# Patient Record
Sex: Male | Born: 1955 | Race: White | Hispanic: No | Smoking: Former smoker
Health system: Southern US, Community
[De-identification: ages and names within clinical notes are randomized; demographics above are authoritative.]

## PROBLEM LIST (undated history)

## (undated) DIAGNOSIS — F99 Mental disorder, not otherwise specified: Secondary | ICD-10-CM

## (undated) DIAGNOSIS — F4310 Post-traumatic stress disorder, unspecified: Secondary | ICD-10-CM

## (undated) DIAGNOSIS — F319 Bipolar disorder, unspecified: Secondary | ICD-10-CM

## (undated) DIAGNOSIS — N200 Calculus of kidney: Secondary | ICD-10-CM

## (undated) DIAGNOSIS — F419 Anxiety disorder, unspecified: Secondary | ICD-10-CM

## (undated) DIAGNOSIS — W3400XA Accidental discharge from unspecified firearms or gun, initial encounter: Secondary | ICD-10-CM

## (undated) DIAGNOSIS — I10 Essential (primary) hypertension: Secondary | ICD-10-CM

## (undated) HISTORY — PX: TONSILLECTOMY: SUR1361

## (undated) HISTORY — PX: NOSE SURGERY: SHX723

## (undated) HISTORY — PX: OTHER SURGICAL HISTORY: SHX169

---

## 1998-09-22 ENCOUNTER — Emergency Department (HOSPITAL_COMMUNITY): Admission: EM | Admit: 1998-09-22 | Discharge: 1998-09-22 | Payer: Self-pay | Admitting: Emergency Medicine

## 1998-11-06 ENCOUNTER — Emergency Department (HOSPITAL_COMMUNITY): Admission: EM | Admit: 1998-11-06 | Discharge: 1998-11-06 | Payer: Self-pay | Admitting: Emergency Medicine

## 1998-11-06 ENCOUNTER — Encounter: Payer: Self-pay | Admitting: Emergency Medicine

## 1998-11-15 ENCOUNTER — Emergency Department (HOSPITAL_COMMUNITY): Admission: EM | Admit: 1998-11-15 | Discharge: 1998-11-16 | Payer: Self-pay | Admitting: Emergency Medicine

## 1998-11-16 ENCOUNTER — Inpatient Hospital Stay (HOSPITAL_COMMUNITY): Admission: EM | Admit: 1998-11-16 | Discharge: 1998-11-20 | Payer: Self-pay | Admitting: Emergency Medicine

## 2001-04-25 ENCOUNTER — Other Ambulatory Visit (HOSPITAL_COMMUNITY): Admission: RE | Admit: 2001-04-25 | Discharge: 2001-05-10 | Payer: Self-pay | Admitting: Psychiatry

## 2004-05-10 ENCOUNTER — Emergency Department (HOSPITAL_COMMUNITY): Admission: EM | Admit: 2004-05-10 | Discharge: 2004-05-10 | Payer: Self-pay | Admitting: Emergency Medicine

## 2004-07-19 ENCOUNTER — Encounter: Payer: Self-pay | Admitting: Emergency Medicine

## 2004-07-19 ENCOUNTER — Inpatient Hospital Stay (HOSPITAL_COMMUNITY): Admission: RE | Admit: 2004-07-19 | Discharge: 2004-07-24 | Payer: Self-pay | Admitting: Psychiatry

## 2004-11-20 ENCOUNTER — Emergency Department (HOSPITAL_COMMUNITY): Admission: EM | Admit: 2004-11-20 | Discharge: 2004-11-20 | Payer: Self-pay | Admitting: Emergency Medicine

## 2006-06-01 ENCOUNTER — Emergency Department (HOSPITAL_COMMUNITY): Admission: EM | Admit: 2006-06-01 | Discharge: 2006-06-01 | Payer: Self-pay | Admitting: Emergency Medicine

## 2008-04-16 ENCOUNTER — Inpatient Hospital Stay (HOSPITAL_COMMUNITY): Admission: AD | Admit: 2008-04-16 | Discharge: 2008-04-20 | Payer: Self-pay | Admitting: *Deleted

## 2008-04-16 ENCOUNTER — Encounter: Payer: Self-pay | Admitting: Emergency Medicine

## 2008-04-16 ENCOUNTER — Ambulatory Visit: Payer: Self-pay | Admitting: *Deleted

## 2009-11-11 ENCOUNTER — Emergency Department (HOSPITAL_COMMUNITY): Admission: EM | Admit: 2009-11-11 | Discharge: 2009-11-11 | Payer: Self-pay | Admitting: Emergency Medicine

## 2009-12-03 ENCOUNTER — Emergency Department (HOSPITAL_COMMUNITY): Admission: EM | Admit: 2009-12-03 | Discharge: 2009-12-03 | Payer: Self-pay | Admitting: Emergency Medicine

## 2009-12-10 ENCOUNTER — Emergency Department (HOSPITAL_COMMUNITY): Admission: EM | Admit: 2009-12-10 | Discharge: 2009-12-10 | Payer: Self-pay | Admitting: Emergency Medicine

## 2010-02-13 ENCOUNTER — Emergency Department (HOSPITAL_COMMUNITY): Admission: EM | Admit: 2010-02-13 | Discharge: 2010-02-13 | Payer: Self-pay | Admitting: Emergency Medicine

## 2010-02-16 ENCOUNTER — Emergency Department (HOSPITAL_COMMUNITY): Admission: EM | Admit: 2010-02-16 | Discharge: 2010-02-16 | Payer: Self-pay | Admitting: Emergency Medicine

## 2011-03-29 LAB — DIFFERENTIAL
Basophils Absolute: 0 10*3/uL (ref 0.0–0.1)
Basophils Relative: 0 % (ref 0–1)
Eosinophils Absolute: 0.3 10*3/uL (ref 0.0–0.7)
Eosinophils Relative: 3 % (ref 0–5)
Lymphocytes Relative: 33 % (ref 12–46)
Lymphs Abs: 3.4 10*3/uL (ref 0.7–4.0)
Monocytes Absolute: 0.7 10*3/uL (ref 0.1–1.0)
Monocytes Relative: 6 % (ref 3–12)
Neutro Abs: 6 10*3/uL (ref 1.7–7.7)
Neutrophils Relative %: 58 % (ref 43–77)

## 2011-03-29 LAB — CBC
HCT: 45.2 % (ref 39.0–52.0)
Hemoglobin: 16 g/dL (ref 13.0–17.0)
MCHC: 35.3 g/dL (ref 30.0–36.0)
MCV: 96.7 fL (ref 78.0–100.0)
Platelets: 266 10*3/uL (ref 150–400)
RBC: 4.68 MIL/uL (ref 4.22–5.81)
RDW: 14.3 % (ref 11.5–15.5)
WBC: 10.4 10*3/uL (ref 4.0–10.5)

## 2011-03-29 LAB — BASIC METABOLIC PANEL WITH GFR
BUN: 8 mg/dL (ref 6–23)
CO2: 27 meq/L (ref 19–32)
Calcium: 8.9 mg/dL (ref 8.4–10.5)
Chloride: 108 meq/L (ref 96–112)
Creatinine, Ser: 0.99 mg/dL (ref 0.4–1.5)
GFR calc non Af Amer: >60 mL/min
Glucose, Bld: 136 mg/dL — ABNORMAL HIGH (ref 70–99)
Potassium: 4 meq/L (ref 3.5–5.1)
Sodium: 142 meq/L (ref 135–145)
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
## 2011-05-12 NOTE — Discharge Summary (Signed)
NAMEMarland Delacruz  ISSAI, WERLING NO.:  [id number]   MEDICAL RECORD NO.:  [id number]                   PATIENT TYPE:  IPS   LOCATION:  0302                                 FACILITY:  [facility]   PHYSICIAN:  Jeanice Lim, M.D.              DATE OF BIRTH:  1956/10/24   DATE OF ADMISSION:  07/19/2004  DATE OF DISCHARGE:  07/24/2004                                 DISCHARGE SUMMARY   IDENTIFYING DATA:  This is a 55 year old Caucasian male, separated,  voluntarily admitted with a history of bipolar disorder, presenting to the  ER agitated, hostile.  Alcohol level 200.  Requesting help with mood.  Feeling medications are not working.  House going up for sale.  Wife has  left him.  She was paying mortgage, drinking two beers last night, as per  patient.  Daily alcohol use.  Followed by Dr. Evelene Croon outpatient for one month  now.  [facility] had [facility] 68 [facility]  Admitted for bipolar and PTSD in the past.   MEDICATIONS:  Geodon (required in the ER) 20 mg IM, Trileptal, Abilify,  Seroquel.   ALLERGIES:  WHOOPING COUGH SERUM (which caused childhood seizure as per  patient).   PHYSICAL EXAMINATION:  Physical and neurological exam within normal limits.   LABORATORY DATA:  Routine admission labs within normal limits.   MENTAL STATUS EXAM:  Fully alert, anxious, jittery, tapping feet, jiggling  legs all through interview.  Cooperative.  Speech within normal limits.  Mood irritable, dysphoric and guarded.  Thought process mostly goal directed  and vague.  Positive suicidal ideation.  Cognitively intact.  Judgment and  insight impaired.   MEDICATIONS:  Past medications include Depakote (which was too expensive)  and lithium (caused a side effect to his body that he did not like but would  not elaborate).   ADMISSION DIAGNOSES:   AXIS I:  1. Likely bipolar disorder, hypomanic state.  2. Post-traumatic stress disorder by  history.  3. Alcohol abuse versus dependence.   AXIS II:  Deferred.   AXIS III:  None.   AXIS IV:  Moderate (problems related to support system, financial stress and  separation).   AXIS V:  20/55-60.   HOSPITAL COURSE:  The patient was admitted and ordered routine p.r.n.  medications and underwent further monitoring.  Was encouraged to participate  in individual, group and milieu therapy.  Was placed on 15-minute safety  checks.  Restarted on Seroquel and Abilify continued.  Trileptal continued  and Valium discontinued and patient was placed on a Librium detox protocol  due to alcohol use and risk issues related.  The patient complained of mood  swings and described multiple stressors, feeling overwhelmed and had been  noncompliant with medications in part due to expense.  Was hypertalkative,  agitated, easy to anger.  Trileptal was changed to Tegretol to optimize  stabilization  of mood.  The patient gradually showed improvement, becoming  less labile, less reactive with improved frustration tolerance and  resolution of suicidal or homicidal ideation.  Mood became more stable and  affect brighter with less depressive symptoms and no mood elevation or  swings.  There is no dangerous ideation or psychotic symptoms.  The patient  reported motivation to be compliant with the aftercare plan.  The patient  was given medication education.   DISCHARGE MEDICATIONS:  1. Abilify 15 mg b.i.d.  2. Seroquel 200 mg q.h.s.  3. Quattro 200 mg, 2 q.a.m. and 2 q.h.s. versus Tegretol XR 200 mg, 2 q.a.m.     and 2 q.h.s.   FOLLOW UP:  The patient is to follow up with Dr. Evelene Croon on Thursday, July 28, 2004 at 11:45 a.m.  The patient also had followup with [name] on  July 29, 2004 at 11 a.m.   DISCHARGE DIAGNOSES:   AXIS I:  1. Likely bipolar disorder, hypomanic state.  2. Post-traumatic stress disorder by history.  3. Alcohol abuse versus dependence.   AXIS II:  Deferred.   AXIS III:   None.   AXIS IV:  Moderate (problems related to support system, financial stress and  separation).   AXIS V:  Global Assessment of Functioning on discharge 55.                                               Jeanice Lim, M.D.    JEM/MEDQ  D:  08/14/2004  T:  08/14/2004  Job:  [id number]
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
## 2011-07-04 ENCOUNTER — Emergency Department (HOSPITAL_COMMUNITY)
Admission: EM | Admit: 2011-07-04 | Discharge: 2011-07-05 | Discharge status: Against Medical Advice | Payer: Medicare Other | Source: Home / Self Care | Attending: Emergency Medicine | Admitting: Emergency Medicine

## 2011-07-04 DIAGNOSIS — Y9229 Other specified public building as the place of occurrence of the external cause: Secondary | ICD-10-CM | POA: Insufficient documentation

## 2011-07-04 DIAGNOSIS — T07XXXA Unspecified multiple injuries, initial encounter: Secondary | ICD-10-CM | POA: Insufficient documentation

## 2011-07-04 DIAGNOSIS — M25539 Pain in unspecified wrist: Secondary | ICD-10-CM | POA: Insufficient documentation

## 2011-07-04 DIAGNOSIS — H1130 Conjunctival hemorrhage, unspecified eye: Secondary | ICD-10-CM | POA: Insufficient documentation

## 2011-07-04 DIAGNOSIS — M542 Cervicalgia: Secondary | ICD-10-CM | POA: Insufficient documentation

## 2011-07-05 ENCOUNTER — Emergency Department (HOSPITAL_COMMUNITY): Payer: Medicare Other

## 2011-07-05 ENCOUNTER — Emergency Department (HOSPITAL_COMMUNITY)
Admission: EM | Admit: 2011-07-05 | Discharge: 2011-07-06 | Disposition: A | Discharge status: Other Acute Inpatient Hospital | Payer: Medicare Other | Source: Home / Self Care | Attending: Emergency Medicine | Admitting: Emergency Medicine

## 2011-07-05 DIAGNOSIS — R443 Hallucinations, unspecified: Secondary | ICD-10-CM | POA: Insufficient documentation

## 2011-07-05 DIAGNOSIS — F17200 Nicotine dependence, unspecified, uncomplicated: Secondary | ICD-10-CM | POA: Insufficient documentation

## 2011-07-05 DIAGNOSIS — F4310 Post-traumatic stress disorder, unspecified: Secondary | ICD-10-CM | POA: Insufficient documentation

## 2011-07-05 DIAGNOSIS — Z79899 Other long term (current) drug therapy: Secondary | ICD-10-CM | POA: Insufficient documentation

## 2011-07-05 DIAGNOSIS — S0010XA Contusion of unspecified eyelid and periocular area, initial encounter: Secondary | ICD-10-CM | POA: Insufficient documentation

## 2011-07-05 DIAGNOSIS — X58XXXA Exposure to other specified factors, initial encounter: Secondary | ICD-10-CM | POA: Insufficient documentation

## 2011-07-05 DIAGNOSIS — F319 Bipolar disorder, unspecified: Secondary | ICD-10-CM | POA: Insufficient documentation

## 2011-07-05 DIAGNOSIS — R45851 Suicidal ideations: Secondary | ICD-10-CM | POA: Insufficient documentation

## 2011-07-05 LAB — COMPREHENSIVE METABOLIC PANEL
Albumin: 4.7 g/dL (ref 3.5–5.2)
Alkaline Phosphatase: 112 U/L (ref 39–117)
BUN: 25 mg/dL — ABNORMAL HIGH (ref 6–23)
Calcium: 9.5 mg/dL (ref 8.4–10.5)
Chloride: 100 mEq/L (ref 96–112)
GFR calc Af Amer: >60 mL/min (ref >60)
Glucose, Bld: 97 mg/dL (ref 70–99)
Potassium: 3.6 mEq/L (ref 3.5–5.1)
Sodium: 139 mEq/L (ref 135–145)
Total Protein: 7.9 g/dL (ref 6.0–8.3)

## 2011-07-05 LAB — DIFFERENTIAL
Lymphocytes Relative: 18 % (ref 12–46)
Monocytes Absolute: 1.7 10*3/uL — ABNORMAL HIGH (ref 0.1–1.0)
Neutro Abs: 11.6 10*3/uL — ABNORMAL HIGH (ref 1.7–7.7)

## 2011-07-05 LAB — CBC
Hemoglobin: 16.2 g/dL (ref 13.0–17.0)
MCH: 32.7 pg (ref 26.0–34.0)
MCHC: 34.1 g/dL (ref 30.0–36.0)
Platelets: 310 10*3/uL (ref 150–400)
RDW: 12.8 % (ref 11.5–15.5)
WBC: 16.2 10*3/uL — ABNORMAL HIGH (ref 4.0–10.5)

## 2011-07-06 ENCOUNTER — Inpatient Hospital Stay (HOSPITAL_COMMUNITY)
Admission: AD | Admit: 2011-07-06 | Discharge: 2011-07-14 | DRG: 885 | Payer: Medicare Other | Attending: Psychiatry | Admitting: Psychiatry

## 2011-07-06 DIAGNOSIS — S0010XA Contusion of unspecified eyelid and periocular area, initial encounter: Secondary | ICD-10-CM

## 2011-07-06 DIAGNOSIS — F39 Unspecified mood [affective] disorder: Principal | ICD-10-CM

## 2011-07-06 DIAGNOSIS — D72829 Elevated white blood cell count, unspecified: Secondary | ICD-10-CM

## 2011-07-06 DIAGNOSIS — I10 Essential (primary) hypertension: Secondary | ICD-10-CM

## 2011-07-06 DIAGNOSIS — F4310 Post-traumatic stress disorder, unspecified: Secondary | ICD-10-CM

## 2011-07-06 DIAGNOSIS — R45851 Suicidal ideations: Secondary | ICD-10-CM

## 2011-07-06 DIAGNOSIS — R45850 Homicidal ideations: Secondary | ICD-10-CM

## 2011-07-06 DIAGNOSIS — F312 Bipolar disorder, current episode manic severe with psychotic features: Secondary | ICD-10-CM

## 2011-07-06 DIAGNOSIS — X58XXXA Exposure to other specified factors, initial encounter: Secondary | ICD-10-CM

## 2011-07-06 DIAGNOSIS — F1010 Alcohol abuse, uncomplicated: Secondary | ICD-10-CM

## 2011-07-06 DIAGNOSIS — F411 Generalized anxiety disorder: Secondary | ICD-10-CM

## 2011-07-06 DIAGNOSIS — Z6379 Other stressful life events affecting family and household: Secondary | ICD-10-CM

## 2011-07-06 LAB — RAPID URINE DRUG SCREEN, HOSP PERFORMED
Amphetamines: NOT DETECTED
Barbiturates: NOT DETECTED
Benzodiazepines: NOT DETECTED
Tetrahydrocannabinol: POSITIVE — AB

## 2011-07-07 DIAGNOSIS — F319 Bipolar disorder, unspecified: Secondary | ICD-10-CM

## 2011-07-07 DIAGNOSIS — F4310 Post-traumatic stress disorder, unspecified: Secondary | ICD-10-CM
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
## 2011-09-19 LAB — POCT I-STAT, CHEM 8
BUN: 11
Chloride: 103
Glucose, Bld: 92
Hemoglobin: 17
Potassium: 3.7
Sodium: 139

## 2011-09-19 LAB — RAPID URINE DRUG SCREEN, HOSP PERFORMED
Amphetamines: NOT DETECTED
Barbiturates: NOT DETECTED
Benzodiazepines: POSITIVE — AB
Cocaine: NOT DETECTED
Opiates: NOT DETECTED
Tetrahydrocannabinol: NOT DETECTED

## 2011-09-19 LAB — ETHANOL: Alcohol, Ethyl (B): 283 — ABNORMAL HIGH

## 2013-07-08 ENCOUNTER — Emergency Department (HOSPITAL_COMMUNITY)
Admission: EM | Admit: 2013-07-08 | Discharge: 2013-07-09 | Disposition: A | Discharge status: Home / Self Care | Payer: Medicare Other | Attending: Emergency Medicine | Admitting: Emergency Medicine

## 2013-07-08 DIAGNOSIS — Z008 Encounter for other general examination: Secondary | ICD-10-CM | POA: Insufficient documentation

## 2013-07-08 DIAGNOSIS — R45850 Homicidal ideations: Secondary | ICD-10-CM | POA: Insufficient documentation

## 2013-07-08 DIAGNOSIS — F319 Bipolar disorder, unspecified: Secondary | ICD-10-CM | POA: Insufficient documentation

## 2013-07-08 DIAGNOSIS — IMO0002 Reserved for concepts with insufficient information to code with codable children: Secondary | ICD-10-CM | POA: Insufficient documentation

## 2013-07-08 HISTORY — DX: Mental disorder, not otherwise specified: F99

## 2013-07-08 LAB — COMPREHENSIVE METABOLIC PANEL
ALT: 29 U/L (ref 0–53)
AST: 31 U/L (ref 0–37)
Albumin: 4 g/dL (ref 3.5–5.2)
CO2: 24 mEq/L (ref 19–32)
Calcium: 9.3 mg/dL (ref 8.4–10.5)
Chloride: 97 mEq/L (ref 96–112)
Creatinine, Ser: 0.97 mg/dL (ref 0.50–1.35)
GFR calc non Af Amer: >90 mL/min (ref >90)
Sodium: 137 mEq/L (ref 135–145)

## 2013-07-08 LAB — CBC WITH DIFFERENTIAL/PLATELET
Basophils Absolute: 0 10*3/uL (ref 0.0–0.1)
Basophils Relative: 0 % (ref 0–1)
Eosinophils Relative: 1 % (ref 0–5)
Lymphocytes Relative: 35 % (ref 12–46)
MCHC: 33.9 g/dL (ref 30.0–36.0)
Neutro Abs: 7.2 10*3/uL (ref 1.7–7.7)
Platelets: 347 10*3/uL (ref 150–400)
RDW: 14 % (ref 11.5–15.5)
WBC: 13.1 10*3/uL — ABNORMAL HIGH (ref 4.0–10.5)

## 2013-07-08 LAB — URINALYSIS, ROUTINE W REFLEX MICROSCOPIC
Bilirubin Urine: NEGATIVE
Hgb urine dipstick: NEGATIVE
Ketones, ur: NEGATIVE mg/dL
Nitrite: NEGATIVE
Protein, ur: NEGATIVE mg/dL
Specific Gravity, Urine: 1.015 (ref 1.005–1.030)
Urobilinogen, UA: 0.2 mg/dL (ref 0.0–1.0)

## 2013-07-08 LAB — RAPID URINE DRUG SCREEN, HOSP PERFORMED
Amphetamines: NOT DETECTED
Barbiturates: NOT DETECTED
Benzodiazepines: NOT DETECTED
Cocaine: NOT DETECTED
Tetrahydrocannabinol: POSITIVE — AB

## 2013-07-08 LAB — ETHANOL: Alcohol, Ethyl (B): 93 mg/dL — ABNORMAL HIGH (ref 0–11)

## 2013-07-08 MED ORDER — ACETAMINOPHEN 325 MG PO TABS: 650.0000 mg | ORAL_TABLET | ORAL | Status: DC | PRN

## 2013-07-08 MED ORDER — ZOLPIDEM TARTRATE 5 MG PO TABS: 5.0000 mg | ORAL_TABLET | Freq: Every evening | ORAL | Status: DC | PRN

## 2013-07-08 MED ORDER — LORAZEPAM 1 MG PO TABS
1.0000 mg | ORAL_TABLET | Freq: Three times a day (TID) | ORAL | Status: DC | PRN
  Administered 2013-07-08: 1 mg via ORAL
  Filled 2013-07-08: qty 1

## 2013-07-08 MED ORDER — IBUPROFEN 200 MG PO TABS: 600.0000 mg | ORAL_TABLET | Freq: Three times a day (TID) | ORAL | Status: DC | PRN

## 2013-07-08 MED ORDER — NICOTINE 21 MG/24HR TD PT24
21.0000 mg | MEDICATED_PATCH | Freq: Every day | TRANSDERMAL | Status: DC
  Administered 2013-07-08: 21 mg via TRANSDERMAL
  Filled 2013-07-08: qty 1

## 2013-07-08 MED ORDER — ONDANSETRON HCL 4 MG PO TABS: 4.0000 mg | ORAL_TABLET | Freq: Three times a day (TID) | ORAL | Status: DC | PRN

## 2013-07-08 MED ORDER — LORAZEPAM 2 MG/ML IJ SOLN
1.0000 mg | Freq: Once | INTRAMUSCULAR | Status: AC
  Administered 2013-07-08: 1 mg via INTRAMUSCULAR
  Filled 2013-07-08: qty 1

## 2013-07-08 MED ORDER — ALUM & MAG HYDROXIDE-SIMETH 200-200-20 MG/5ML PO SUSP: 30.0000 mL | ORAL | Status: DC | PRN

## 2013-07-08 NOTE — ED Notes (Signed)
Patient is awake and alert, denies HI or SI, pt is pleasant and cooperative. Pt is medication compliant. Pt c/o of bilateral foot pain, 9/10 on (10/10) shooting pain radiating to mid ankle.  Interacts well with staff and others. Pt reports not taking is medications for two years; Wellbutrin, Zyprexa, Valium and Trazodone pt doesn't recall the dose/mg. Encouraged and support offered. Will continue to monitor for safety. Q15 mins/ hourly rounding.

## 2013-07-08 NOTE — ED Notes (Signed)
Patient has one bag of belongings in locker 26.  Security has patient's knife.
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
## 2013-07-08 NOTE — ED Provider Notes (Signed)
History    CSN: [id number] Arrival date & time 07/08/13  1349  First MD Initiated Contact with Patient 07/08/13 1351     Chief Complaint  Patient presents with  . Medical Clearance   (Consider location/radiation/quality/duration/timing/severity/associated sxs/prior Treatment) HPI Comments: Patient is a 57 year old male with a stated history of PTSD, depression, bipolar disorder, and OCD (no official documentation found and patient not a reliable historian) who presents for homicidal ideation with onset earlier today. Patient states that he has been off of his psychiatric medications for 2 years; states he is supposed to be taking Zyprexa, Valium, Wellbutrin, and a fourth medication the patient cannot recall the name of. Patient states he has been followed by Dr. Evelene Croon, his psychiatrist, off [address] but has not seen her in over 2 years. Claiming to have follow up appt scheduled for 08/29/13. Patient denies homicidal plan, only that he is having thoughts of killing "all of mankind". States he drank 3 beers today and denies illicit drug use. Patient without any medical or pain complaints. Denies SI and plan for suicide. Patient is not here voluntarily; will likely require IVC.  The history is provided by the patient. No language interpreter was used.   No past medical history on file. No past surgical history on file. No family history on file. History  Substance Use Topics  . Smoking status: Not on file  . Smokeless tobacco: Not on file  . Alcohol Use: Not on file    Review of Systems  Constitutional: Negative for fever.  Psychiatric/Behavioral: Positive for agitation. Negative for suicidal ideas.       +homicidal ideation  All other systems reviewed and are negative.    Allergies  Review of patient's allergies indicates no known allergies.  Home Medications  No current outpatient prescriptions on file. BP 148/90  Pulse 88  Temp(Src) 97.6 F (36.4 C) (Oral)  Resp 16   SpO2 95%  Physical Exam  Nursing note and vitals reviewed. Constitutional: He is oriented to person, place, and time. He appears well-developed and well-nourished. No distress.  HENT:  Head: Normocephalic and atraumatic.  Mouth/Throat: Oropharynx is clear and moist. No oropharyngeal exudate.  Eyes: Conjunctivae and EOM are normal. Pupils are equal, round, and reactive to light. No scleral icterus.  Neck: Normal range of motion. Neck supple.  Cardiovascular: Regular rhythm and intact distal pulses.   Tachycardic rate, regular rhythm. Heart sounds normal.  Pulmonary/Chest: Effort normal and breath sounds normal. No respiratory distress. He has no wheezes. He has no rales.  Abdominal: Soft. He exhibits no distension. There is no tenderness. There is no rebound and no guarding.  Musculoskeletal: Normal range of motion.  Lymphadenopathy:    He has no cervical adenopathy.  Neurological: He is alert and oriented to person, place, and time. He has normal reflexes.  Skin: Skin is warm and dry. No rash noted. He is not diaphoretic. No erythema. No pallor.  Psychiatric: He has a normal mood and affect. His behavior is normal.    ED Course  Procedures (including critical care time) Labs Reviewed  CBC WITH DIFFERENTIAL - Abnormal; Notable for the following:    WBC 13.1 (*)    Lymphs Abs 4.5 (*)    Monocytes Absolute 1.2 (*)    All other components within normal limits  COMPREHENSIVE METABOLIC PANEL - Abnormal; Notable for the following:    Glucose, Bld 118 (*)    Alkaline Phosphatase 146 (*)    All other components within  normal limits  ETHANOL - Abnormal; Notable for the following:    Alcohol, Ethyl (B) 93 (*)    All other components within normal limits  URINE RAPID DRUG SCREEN (HOSP PERFORMED)  URINALYSIS, ROUTINE W REFLEX MICROSCOPIC   No results found.  1. Homicidal ideation 2. Agitation  MDM  Patient here for homicidal ideation without plan. Patient not a reliable historian,  but claiming to have hx of PTSD, depression, bipolar d/o, and OCD; off meds for 2 years. Medical clearance work up ordered as well as IM ativan for agitation. Physical exam benign. Spoke with [place] from the ACT team who will have ACT evaluate the patient. Patient will also be requiring telepsych consult for HI. Patient will need IVC; Dr. Loretha Stapler to complete.  Patient signed out to Dr. Jodi Mourning who is the psych doctor for the oncoming shift. Patient medically cleared. Temp psych orders placed.      Antony Madura, PA-C 07/08/13 1621

## 2013-07-09 ENCOUNTER — Encounter (HOSPITAL_COMMUNITY): Payer: Self-pay | Admitting: *Deleted

## 2013-07-09 MED ORDER — CLONAZEPAM 1 MG PO TABS: 1.0000 mg | ORAL_TABLET | Freq: Every day | ORAL | Status: DC

## 2013-07-09 MED ORDER — BUPROPION HCL ER (SR) 150 MG PO TB12: 150.0000 mg | ORAL_TABLET | Freq: Every day | ORAL | Status: DC

## 2013-07-09 MED ORDER — OLANZAPINE 20 MG PO TABS: 20.0000 mg | ORAL_TABLET | Freq: Every day | ORAL | Status: DC

## 2013-07-09 MED ORDER — TRAZODONE HCL 100 MG PO TABS: 100.0000 mg | ORAL_TABLET | Freq: Every day | ORAL | Status: DC

## 2013-07-09 NOTE — ED Provider Notes (Signed)
12:34 AM Patient seen and evaluated by the psychiatrist Dr. Jacky Kindle, who recommends discharge home.  He does not believe the patient be a threat to himself or others.  He recommended Zyprexa, Wellbutrin, trazodone, clonazepam.  Please see consultation note for complete details.  Lyanne Co, MD 07/09/13 [phone]
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
## 2013-07-09 NOTE — ED Provider Notes (Signed)
Medical screening examination/treatment/procedure(s) were conducted as a shared visit with non-physician practitioner(s) and myself.  I personally evaluated the patient during the encounter.   Please see my separate note.    Candyce Churn, MD 07/09/13 [phone]

## 2013-07-31 ENCOUNTER — Emergency Department (HOSPITAL_COMMUNITY)
Admission: EM | Admit: 2013-07-31 | Discharge: 2013-07-31 | Discharge status: Against Medical Advice | Payer: Medicare Other | Attending: Emergency Medicine | Admitting: Emergency Medicine

## 2013-07-31 ENCOUNTER — Encounter (HOSPITAL_COMMUNITY): Payer: Self-pay | Admitting: Emergency Medicine

## 2013-07-31 DIAGNOSIS — Z79899 Other long term (current) drug therapy: Secondary | ICD-10-CM | POA: Insufficient documentation

## 2013-07-31 DIAGNOSIS — F319 Bipolar disorder, unspecified: Secondary | ICD-10-CM | POA: Insufficient documentation

## 2013-07-31 DIAGNOSIS — M79609 Pain in unspecified limb: Secondary | ICD-10-CM | POA: Insufficient documentation

## 2013-07-31 DIAGNOSIS — Z87442 Personal history of urinary calculi: Secondary | ICD-10-CM | POA: Insufficient documentation

## 2013-07-31 DIAGNOSIS — F411 Generalized anxiety disorder: Secondary | ICD-10-CM | POA: Insufficient documentation

## 2013-07-31 DIAGNOSIS — I10 Essential (primary) hypertension: Secondary | ICD-10-CM | POA: Insufficient documentation

## 2013-07-31 DIAGNOSIS — G8929 Other chronic pain: Secondary | ICD-10-CM | POA: Insufficient documentation

## 2013-07-31 DIAGNOSIS — Z8659 Personal history of other mental and behavioral disorders: Secondary | ICD-10-CM | POA: Insufficient documentation

## 2013-07-31 DIAGNOSIS — M549 Dorsalgia, unspecified: Secondary | ICD-10-CM | POA: Insufficient documentation

## 2013-07-31 DIAGNOSIS — F17200 Nicotine dependence, unspecified, uncomplicated: Secondary | ICD-10-CM | POA: Insufficient documentation

## 2013-07-31 HISTORY — DX: Calculus of kidney: N20.0

## 2013-07-31 HISTORY — DX: Accidental discharge from unspecified firearms or gun, initial encounter: W34.00XA

## 2013-07-31 HISTORY — DX: Essential (primary) hypertension: I10

## 2013-07-31 HISTORY — DX: Post-traumatic stress disorder, unspecified: F43.10

## 2013-07-31 HISTORY — DX: Anxiety disorder, unspecified: F41.9

## 2013-07-31 HISTORY — DX: Bipolar disorder, unspecified: F31.9

## 2013-07-31 LAB — RAPID URINE DRUG SCREEN, HOSP PERFORMED
Amphetamines: NOT DETECTED
Cocaine: NOT DETECTED
Opiates: NOT DETECTED

## 2013-07-31 LAB — COMPREHENSIVE METABOLIC PANEL
Albumin: 3.6 g/dL (ref 3.5–5.2)
BUN: 12 mg/dL (ref 6–23)
Calcium: 9 mg/dL (ref 8.4–10.5)
Chloride: 98 mEq/L (ref 96–112)
Creatinine, Ser: 0.95 mg/dL (ref 0.50–1.35)
Total Bilirubin: 0.2 mg/dL — ABNORMAL LOW (ref 0.3–1.2)

## 2013-07-31 LAB — ETHANOL: Alcohol, Ethyl (B): 36 mg/dL — ABNORMAL HIGH (ref 0–11)

## 2013-07-31 LAB — CBC
HCT: 44.9 % (ref 39.0–52.0)
Hemoglobin: 15.3 g/dL (ref 13.0–17.0)
MCH: 33 pg (ref 26.0–34.0)
MCHC: 34.1 g/dL (ref 30.0–36.0)
MCV: 97 fL (ref 78.0–100.0)
RBC: 4.63 MIL/uL (ref 4.22–5.81)
RDW: 13.8 % (ref 11.5–15.5)

## 2013-07-31 MED ORDER — ZIPRASIDONE MESYLATE 20 MG IM SOLR
INTRAMUSCULAR | Status: AC
  Filled 2013-07-31: qty 20

## 2013-07-31 MED ORDER — ZIPRASIDONE MESYLATE 20 MG IM SOLR: 20.0000 mg | INTRAMUSCULAR | Status: DC

## 2013-07-31 NOTE — ED Notes (Signed)
Pt states he is here for mania, PTSD, having flash backs, states he is hungry, has no money, is having pain in his back and legs.  Pt states he has been here for same in the past   Pt is agitated but cooperative with triage personal but is belligerent and agitated with security and police

## 2013-07-31 NOTE — ED Notes (Signed)
Pt states he is tired of waiting to be assessed and wants to leave. EDP notified and agrees that pt may leave AMA.

## 2013-07-31 NOTE — ED Notes (Addendum)
Pt belongings as follows pair of green shorts, black boxers, brown shoes, 2 trojan bare skin condoms, 3 cigarettes, samsung galaxy s phone.  Pt placed in blue scrubs and wanded.  Pt belongings placed in locker 30.

## 2013-07-31 NOTE — ED Notes (Signed)
Pt states he has been out of his meds for a few weeks and has an appt with his therapist is Sept 5th
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
## 2013-11-06 ENCOUNTER — Emergency Department (HOSPITAL_COMMUNITY)
Admission: EM | Admit: 2013-11-06 | Discharge: 2013-11-11 | Disposition: A | Discharge status: Home / Self Care | Payer: Medicare Other | Attending: Emergency Medicine | Admitting: Emergency Medicine

## 2013-11-06 ENCOUNTER — Encounter (HOSPITAL_COMMUNITY): Payer: Self-pay | Admitting: Emergency Medicine

## 2013-11-06 DIAGNOSIS — Z79899 Other long term (current) drug therapy: Secondary | ICD-10-CM | POA: Insufficient documentation

## 2013-11-06 DIAGNOSIS — F319 Bipolar disorder, unspecified: Secondary | ICD-10-CM | POA: Insufficient documentation

## 2013-11-06 DIAGNOSIS — R45850 Homicidal ideations: Secondary | ICD-10-CM

## 2013-11-06 DIAGNOSIS — F17200 Nicotine dependence, unspecified, uncomplicated: Secondary | ICD-10-CM | POA: Insufficient documentation

## 2013-11-06 DIAGNOSIS — F411 Generalized anxiety disorder: Secondary | ICD-10-CM | POA: Insufficient documentation

## 2013-11-06 DIAGNOSIS — F332 Major depressive disorder, recurrent severe without psychotic features: Secondary | ICD-10-CM | POA: Diagnosis present

## 2013-11-06 DIAGNOSIS — IMO0002 Reserved for concepts with insufficient information to code with codable children: Secondary | ICD-10-CM | POA: Insufficient documentation

## 2013-11-06 DIAGNOSIS — R45851 Suicidal ideations: Secondary | ICD-10-CM | POA: Insufficient documentation

## 2013-11-06 DIAGNOSIS — F200 Paranoid schizophrenia: Secondary | ICD-10-CM | POA: Insufficient documentation

## 2013-11-06 DIAGNOSIS — R4789 Other speech disturbances: Secondary | ICD-10-CM | POA: Insufficient documentation

## 2013-11-06 DIAGNOSIS — I10 Essential (primary) hypertension: Secondary | ICD-10-CM | POA: Insufficient documentation

## 2013-11-06 DIAGNOSIS — Z87442 Personal history of urinary calculi: Secondary | ICD-10-CM | POA: Insufficient documentation

## 2013-11-06 DIAGNOSIS — Z87828 Personal history of other (healed) physical injury and trauma: Secondary | ICD-10-CM | POA: Insufficient documentation

## 2013-11-06 LAB — COMPREHENSIVE METABOLIC PANEL
CO2: 26 mEq/L (ref 19–32)
Calcium: 9.2 mg/dL (ref 8.4–10.5)
Creatinine, Ser: 1.44 mg/dL — ABNORMAL HIGH (ref 0.50–1.35)
GFR calc Af Amer: 62 mL/min — ABNORMAL LOW (ref >90)
GFR calc non Af Amer: 53 mL/min — ABNORMAL LOW (ref >90)
Glucose, Bld: 102 mg/dL — ABNORMAL HIGH (ref 70–99)

## 2013-11-06 LAB — RAPID URINE DRUG SCREEN, HOSP PERFORMED
Cocaine: NOT DETECTED
Opiates: NOT DETECTED

## 2013-11-06 LAB — CBC
Hemoglobin: 15 g/dL (ref 13.0–17.0)
MCH: 32.1 pg (ref 26.0–34.0)
MCV: 94.4 fL (ref 78.0–100.0)
Platelets: 383 10*3/uL (ref 150–400)
RBC: 4.68 MIL/uL (ref 4.22–5.81)

## 2013-11-06 MED ORDER — ACETAMINOPHEN 325 MG PO TABS: 650.0000 mg | ORAL_TABLET | ORAL | Status: DC | PRN

## 2013-11-06 MED ORDER — OLANZAPINE 10 MG PO TABS
20.0000 mg | ORAL_TABLET | Freq: Every day | ORAL | Status: DC
  Administered 2013-11-06 – 2013-11-10 (×5): 20 mg via ORAL
  Filled 2013-11-06 (×5): qty 2

## 2013-11-06 MED ORDER — ALUM & MAG HYDROXIDE-SIMETH 200-200-20 MG/5ML PO SUSP: 30.0000 mL | ORAL | Status: DC | PRN

## 2013-11-06 MED ORDER — NICOTINE 21 MG/24HR TD PT24
21.0000 mg | MEDICATED_PATCH | Freq: Every day | TRANSDERMAL | Status: DC
  Administered 2013-11-06 – 2013-11-11 (×6): 21 mg via TRANSDERMAL
  Filled 2013-11-06 (×6): qty 1

## 2013-11-06 MED ORDER — SERTRALINE HCL 50 MG PO TABS
50.0000 mg | ORAL_TABLET | Freq: Every day | ORAL | Status: DC
  Administered 2013-11-06 – 2013-11-11 (×6): 50 mg via ORAL
  Filled 2013-11-06 (×5): qty 1

## 2013-11-06 MED ORDER — TRAZODONE HCL 100 MG PO TABS
100.0000 mg | ORAL_TABLET | Freq: Every day | ORAL | Status: DC
  Administered 2013-11-06 – 2013-11-10 (×5): 100 mg via ORAL
  Filled 2013-11-06 (×5): qty 1

## 2013-11-06 MED ORDER — IBUPROFEN 200 MG PO TABS
600.0000 mg | ORAL_TABLET | Freq: Three times a day (TID) | ORAL | Status: DC | PRN
  Administered 2013-11-07 – 2013-11-09 (×2): 600 mg via ORAL
  Filled 2013-11-06 (×2): qty 3

## 2013-11-06 MED ORDER — ONDANSETRON HCL 4 MG PO TABS: 4.0000 mg | ORAL_TABLET | Freq: Three times a day (TID) | ORAL | Status: DC | PRN

## 2013-11-06 MED ORDER — ZOLPIDEM TARTRATE 5 MG PO TABS: 5.0000 mg | ORAL_TABLET | Freq: Every evening | ORAL | Status: DC | PRN

## 2013-11-06 MED ORDER — ZIPRASIDONE MESYLATE 20 MG IM SOLR
20.0000 mg | Freq: Two times a day (BID) | INTRAMUSCULAR | Status: DC | PRN
Start: 2013-11-06 — End: 2013-11-11

## 2013-11-06 MED ORDER — CEPHALEXIN 250 MG PO CAPS
250.0000 mg | ORAL_CAPSULE | Freq: Four times a day (QID) | ORAL | Status: DC
  Administered 2013-11-06 – 2013-11-11 (×20): 250 mg via ORAL
  Filled 2013-11-06 (×24): qty 1

## 2013-11-06 MED ORDER — LORAZEPAM 1 MG PO TABS
2.0000 mg | ORAL_TABLET | Freq: Once | ORAL | Status: AC
  Administered 2013-11-06: 2 mg via ORAL
  Filled 2013-11-06: qty 2

## 2013-11-06 MED ORDER — LORAZEPAM 1 MG PO TABS
2.0000 mg | ORAL_TABLET | Freq: Once | ORAL | Status: AC | PRN
  Administered 2013-11-06: 2 mg via ORAL
  Filled 2013-11-06: qty 2

## 2013-11-06 NOTE — BH Assessment (Signed)
MHT faxed referral to [facility]. Spoke with [facility]; No beds. Also, spoke with Arline Asp at [facility] and she asked for a call back in a few minutes when she updates the bed list.  MHT will follow up.  -Dossie Arbour, MA  Mental Health Tech

## 2013-11-06 NOTE — BH Assessment (Signed)
Spoke with [facility]; At capacity - No beds available.  -Dossie Arbour, MA  Mental Health Tech

## 2013-11-06 NOTE — BH Assessment (Signed)
[facility] Assessment Progress Note  Per Thurman Coyer, RN, [facility], pt has been declined for admission to [facility] by Carolanne Grumbling, MD due to acuity.  Pt has reportedly been referred to [facility].  At 15:45 I spoke to [facility], MHT to inform him that further help with disposition will be appreciated.  He a agrees to pass this on to oncoming 2nd shift staff.  Doylene Canning, MA Triage Specialist 11/06/2013 @ 16:12

## 2013-11-06 NOTE — ED Notes (Addendum)
Pt has in belonging bag:  Light brown boots, cameoflage winter jacket, green t-shirt, blue jean shorts, brown belt, silver plated cross from the [employer] with a black cloth chain.

## 2013-11-06 NOTE — ED Notes (Signed)
Pt resting in bed. Pleasant calm and cooperative. Pt took antibiotics from this nurse without verbal or physical threats to this nurse. Pt came in to the ED with dx cellulitis on  inner calf. Pt has dry scabbed patch to inner calf. No drainage noted.

## 2013-11-06 NOTE — ED Notes (Signed)
Pt talking on phone leaving message for brother stating he received a bus pass from another facility and is now here in [place] and doesn't know if he can make it to [place] on the bus. Pt  Then talking to another person on phone.

## 2013-11-06 NOTE — BH Assessment (Signed)
Per Misty Stanley at [facility], she did not have Pt's referral fax. MHT resent the referral.  -Dossie Arbour, MA  Mental Health Tech

## 2013-11-06 NOTE — ED Notes (Signed)
Pt sitting in bed calm and cooperative. Pt eating his meal.
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
## 2013-11-06 NOTE — ED Notes (Signed)
Pt up to nurses station asking for staff to get his brothers phone number out of his wallet. He said he has to let his probation officer know he wouldn't be able to meet with him. He said his brother has the number.

## 2013-11-06 NOTE — ED Notes (Signed)
Pt states he would take medication PO and would also like some geodon and ativan in a shot and would like dilaudid also.
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
## 2013-11-06 NOTE — ED Notes (Signed)
Pt called EMS because he was feeling suicidal with no plan and would like to be admitted to [facility]

## 2013-11-06 NOTE — ED Notes (Signed)
Pt sitting on bed. Interacting with Mental health tech appropriately. Pt allowing staff to take vital signs.

## 2013-11-06 NOTE — ED Provider Notes (Signed)
Date: 11/06/2013  Rate: 89  Rhythm: normal sinus rhythm  QRS Axis: normal  Intervals: normal  ST/T Wave abnormalities: normal  Conduction Disutrbances: LPFB  Narrative Interpretation:   Old EKG Reviewed: none available    Dagmar Hait, MD 11/06/13 2049

## 2013-11-06 NOTE — ED Notes (Signed)
Pt taking a shower. No signs of aggression. No threats or verbal escalation from pt to this nurse or other staff. Pt calm and cooperative. No acts of violence to self.
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
## 2013-11-07 DIAGNOSIS — F332 Major depressive disorder, recurrent severe without psychotic features: Secondary | ICD-10-CM

## 2013-11-07 MED ORDER — HYDROXYZINE HCL 25 MG PO TABS
50.0000 mg | ORAL_TABLET | Freq: Four times a day (QID) | ORAL | Status: DC | PRN
  Administered 2013-11-07 – 2013-11-09 (×4): 50 mg via ORAL
  Filled 2013-11-07 (×4): qty 2
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
## 2013-11-07 NOTE — Progress Notes (Signed)
Per discussion with TTS, they will assist with pt disposition.   Catha Gosselin, [place] [phone]  ED CSW 11/07/2013 942am
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
## 2013-11-07 NOTE — Progress Notes (Signed)
Contacted the following facilities to seek bed availability:  [facility]- Per Peggy, Pt referral still needs to be reviewed by Dr.  [facility]- Per Aggie Cosier, one geriatric bed available with several discharges tomorrow, will send referral [facility]- Per Barnie Del, can fax referral to be reviewed  [facility]- Per Johnny Bridge, no beds available [facility]- Pt can not be [facility]- Per Selena Batten, at [facility]- No answer  Rodman Pickle, MHT

## 2013-11-08 LAB — CBC WITH DIFFERENTIAL/PLATELET
Basophils Relative: 1 % (ref 0–1)
Eosinophils Absolute: 0.8 10*3/uL — ABNORMAL HIGH (ref 0.0–0.7)
Eosinophils Relative: 7 % — ABNORMAL HIGH (ref 0–5)
HCT: 43.9 % (ref 39.0–52.0)
Hemoglobin: 14.9 g/dL (ref 13.0–17.0)
Lymphocytes Relative: 30 % (ref 12–46)
Lymphs Abs: 3.6 10*3/uL (ref 0.7–4.0)
MCH: 31.9 pg (ref 26.0–34.0)
MCHC: 33.9 g/dL (ref 30.0–36.0)
MCV: 94 fL (ref 78.0–100.0)
Monocytes Absolute: 0.9 10*3/uL (ref 0.1–1.0)
Monocytes Relative: 7 % (ref 3–12)
Platelets: 279 10*3/uL (ref 150–400)
RBC: 4.67 MIL/uL (ref 4.22–5.81)
WBC: 12 10*3/uL — ABNORMAL HIGH (ref 4.0–10.5)

## 2013-11-08 NOTE — ED Notes (Signed)
Lab in to draw

## 2013-11-08 NOTE — BH Assessment (Addendum)
[facility] Assessment Progress Note Referral faxed to [facility], as [facility] has beds per Danny (per Bobby Lindsay, LCSW).       

## 2013-11-08 NOTE — ED Provider Notes (Signed)
Patient had cellulitis on abx. CBC improving. Dispo as per psych.   Richardean Canal, MD 11/08/13 [phone]

## 2013-11-08 NOTE — ED Notes (Signed)
Nad, up to desk

## 2013-11-08 NOTE — ED Notes (Signed)
healing lac lt inner calf noted-from where his boots rubbed per pt.  No drainage/redness noted.

## 2013-11-08 NOTE — ED Notes (Signed)
Talking w/ other patient in the hall 

## 2013-11-08 NOTE — ED Notes (Signed)
Up in the bathroom to shower and change scrubs 
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
## 2013-11-08 NOTE — ED Notes (Signed)
Up to the bathroom 

## 2013-11-08 NOTE — ED Notes (Signed)
Up to the desk on the phone 

## 2013-11-08 NOTE — ED Notes (Signed)
Psych MD and josephine NP into see

## 2013-11-09 ENCOUNTER — Encounter (HOSPITAL_COMMUNITY): Payer: Self-pay | Admitting: Registered Nurse

## 2013-11-09 DIAGNOSIS — F332 Major depressive disorder, recurrent severe without psychotic features: Secondary | ICD-10-CM | POA: Diagnosis present

## 2013-11-09 DIAGNOSIS — R45850 Homicidal ideations: Secondary | ICD-10-CM

## 2013-11-09 NOTE — ED Notes (Signed)
No redness/swelling/drainage noted lt inner calf lac

## 2013-11-09 NOTE — ED Notes (Signed)
Up to the bathroom 

## 2013-11-09 NOTE — ED Notes (Signed)
Up tot he bathroom to shower and change scrubs 
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
## 2013-11-09 NOTE — Progress Notes (Signed)
Pt declined for behavioral acuity at [facility] for aggressive behaviors per Dr. Cathie Beams.  Blain Pais, MHT/NS

## 2013-11-09 NOTE — ED Notes (Signed)
Patient presents pleasant calm and cooperative; denies any SI, HI or  AVH, Denies pain, NAD

## 2013-11-09 NOTE — ED Notes (Signed)
Psych md and shuvon NP into see

## 2013-11-09 NOTE — Progress Notes (Signed)
Underwriter faxed referral to [facility] with an authorization #[id number] good until 11/16/13.  Blain Pais, MHT/NS
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
## 2013-11-10 NOTE — Progress Notes (Signed)
   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 in Psych ED for SI Pt states pcp is ruth penderkoth an dellen wilson is psychiatrist, Community education officer is counselor     Subjective/Objective Assessment Detail:     Action/Plan:   Spoke with pt in hallway of psych ED Pt pleasant and cooperative EPIC updated   Action/Plan Detail:   Anticipated DC Date:       Status Recommendation to Physician:   Result of Recommendation:    Other ED Services  Consult Working Plan    DC Planning Services  Other  PCP issues  Outpatient Services - Pt will follow up    Choice offered to / List presented to:            Status of service:  Completed, signed off  ED Comments:   ED Comments Detail:

## 2013-11-10 NOTE — BHH Counselor (Addendum)
TC to [place] at Dr. Carie Caddy office. First available appt for pt would be Dec. 4th at 2:00 pm. Dr. Evelene Croon also is working half day tomorrow 11/17 and Raynelle Fanning could call pt directly if there is a cancellation. Writer scheduled the appt for 12/4 at 2 pm and Raynelle Fanning will call pt directly if there is a cancellation tomorrow. [phone] TC to Ollen Gross PhD. Dr. Andrey Campanile states she doesn't have any appointments before Dec., so she will call pt when she has a cancellation. [phone]. Pt sts he will attend scheduled appointments.    Evette Cristal, [place] Assessment Counselor

## 2013-11-11 NOTE — Progress Notes (Addendum)
Writer consulted with the Psychiatrist (Dr. Ladona Ridgel) and the NP Lysbeth Penner) regarding the patient meeting criteria for inpatient hospitalization.   Writer informed the ER MD Lynelle Doctor) and the nurse working with the patient needs to be discharged.

## 2013-11-11 NOTE — ED Notes (Signed)
Explained AMA to EDP.

## 2013-11-11 NOTE — Consult Note (Signed)
  Psychiatric Specialty Exam: Physical Exam  ROS  Blood pressure 134/86, pulse 68, temperature 97.6 F (36.4 C), temperature source Oral, resp. rate 18, height 5\' 6"  (1.676 m), weight 81.647 kg (180 lb), SpO2 94.00%.Body mass index is 29.07 kg/(m^2).  General Appearance: Casual  Eye Contact::  Good  Speech:  Clear and Coherent and Normal Rate  Volume:  Normal  Mood:  Euthymic  Affect:  Appropriate  Thought Process:  Goal Directed and Logical  Orientation:  Full (Time, Place, and Person)  Thought Content:  Negative  Suicidal Thoughts:  No  Homicidal Thoughts:  No  Memory:  Immediate;   Good Recent;   Good Remote;   Good  Judgement:  Good  Insight:  Good  Psychomotor Activity:  Normal  Concentration:  Good  Recall:  Good  Akathisia:  Negative  Handed:  Right  AIMS (if indicated):     Assets:  Communication Skills Social Support  Sleep:   good   Stephen Delacruz is feeling good today, not suicidal or homicidal and wants to be discharged.  I believe he is safe to go and recommend he be discharged today to follow with his outpatient provider and therapist.

## 2013-11-11 NOTE — ED Notes (Signed)
Pt requesting to be D/C r/t having "business" he needs to do by 5pm.  Per NP and Dr. Ladona Ridgel, ok for pt. To sign out AMA.

## 2013-11-18 ENCOUNTER — Encounter (HOSPITAL_COMMUNITY): Payer: Self-pay | Admitting: Emergency Medicine

## 2013-11-18 ENCOUNTER — Emergency Department (HOSPITAL_COMMUNITY)
Admission: EM | Admit: 2013-11-18 | Discharge: 2013-11-18 | Disposition: A | Discharge status: Home / Self Care | Payer: Medicare Other | Attending: Emergency Medicine | Admitting: Emergency Medicine

## 2013-11-18 ENCOUNTER — Emergency Department (HOSPITAL_COMMUNITY): Payer: Medicare Other

## 2013-11-18 DIAGNOSIS — Y9389 Activity, other specified: Secondary | ICD-10-CM | POA: Insufficient documentation

## 2013-11-18 DIAGNOSIS — Z87828 Personal history of other (healed) physical injury and trauma: Secondary | ICD-10-CM | POA: Insufficient documentation

## 2013-11-18 DIAGNOSIS — F10929 Alcohol use, unspecified with intoxication, unspecified: Secondary | ICD-10-CM

## 2013-11-18 DIAGNOSIS — Z79899 Other long term (current) drug therapy: Secondary | ICD-10-CM | POA: Insufficient documentation

## 2013-11-18 DIAGNOSIS — S61411A Laceration without foreign body of right hand, initial encounter: Secondary | ICD-10-CM

## 2013-11-18 DIAGNOSIS — S0990XA Unspecified injury of head, initial encounter: Secondary | ICD-10-CM

## 2013-11-18 DIAGNOSIS — Y9229 Other specified public building as the place of occurrence of the external cause: Secondary | ICD-10-CM | POA: Insufficient documentation

## 2013-11-18 DIAGNOSIS — W1809XA Striking against other object with subsequent fall, initial encounter: Secondary | ICD-10-CM | POA: Insufficient documentation

## 2013-11-18 DIAGNOSIS — F4310 Post-traumatic stress disorder, unspecified: Secondary | ICD-10-CM | POA: Insufficient documentation

## 2013-11-18 DIAGNOSIS — F411 Generalized anxiety disorder: Secondary | ICD-10-CM | POA: Insufficient documentation

## 2013-11-18 DIAGNOSIS — F17200 Nicotine dependence, unspecified, uncomplicated: Secondary | ICD-10-CM | POA: Insufficient documentation

## 2013-11-18 DIAGNOSIS — I10 Essential (primary) hypertension: Secondary | ICD-10-CM | POA: Insufficient documentation

## 2013-11-18 DIAGNOSIS — Z23 Encounter for immunization: Secondary | ICD-10-CM | POA: Insufficient documentation

## 2013-11-18 DIAGNOSIS — S61409A Unspecified open wound of unspecified hand, initial encounter: Secondary | ICD-10-CM | POA: Insufficient documentation

## 2013-11-18 DIAGNOSIS — F1010 Alcohol abuse, uncomplicated: Secondary | ICD-10-CM | POA: Insufficient documentation

## 2013-11-18 DIAGNOSIS — F319 Bipolar disorder, unspecified: Secondary | ICD-10-CM | POA: Insufficient documentation

## 2013-11-18 DIAGNOSIS — Z87442 Personal history of urinary calculi: Secondary | ICD-10-CM | POA: Insufficient documentation

## 2013-11-18 MED ORDER — TETANUS-DIPHTH-ACELL PERTUSSIS 5-2.5-18.5 LF-MCG/0.5 IM SUSP
0.5000 mL | Freq: Once | INTRAMUSCULAR | Status: AC
  Administered 2013-11-18: 0.5 mL via INTRAMUSCULAR
  Filled 2013-11-18: qty 0.5
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
## 2013-11-18 NOTE — ED Notes (Signed)
Bed: WA20 Expected date: 11/18/13 Expected time: 7:20 PM Means of arrival: Ambulance Comments: Fall, hand lacerations
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
## 2015-04-21 ENCOUNTER — Encounter (HOSPITAL_COMMUNITY): Payer: Self-pay

## 2015-04-21 ENCOUNTER — Encounter (HOSPITAL_COMMUNITY): Payer: Self-pay | Admitting: *Deleted

## 2015-04-21 ENCOUNTER — Emergency Department (HOSPITAL_COMMUNITY)
Admission: EM | Admit: 2015-04-21 | Discharge: 2015-04-21 | Disposition: A | Discharge status: Other Acute Inpatient Hospital | Payer: Medicare Other | Attending: Emergency Medicine | Admitting: Emergency Medicine

## 2015-04-21 ENCOUNTER — Inpatient Hospital Stay (HOSPITAL_COMMUNITY)
Admission: EM | Admit: 2015-04-21 | Discharge: 2015-05-03 | DRG: 885 | Disposition: A | Discharge status: Home / Self Care | Payer: Medicare Other | Source: Intra-hospital | Attending: Psychiatry | Admitting: Psychiatry

## 2015-04-21 DIAGNOSIS — Z8249 Family history of ischemic heart disease and other diseases of the circulatory system: Secondary | ICD-10-CM

## 2015-04-21 DIAGNOSIS — F1220 Cannabis dependence, uncomplicated: Secondary | ICD-10-CM | POA: Diagnosis present

## 2015-04-21 DIAGNOSIS — F419 Anxiety disorder, unspecified: Secondary | ICD-10-CM | POA: Insufficient documentation

## 2015-04-21 DIAGNOSIS — F22 Delusional disorders: Secondary | ICD-10-CM | POA: Diagnosis not present

## 2015-04-21 DIAGNOSIS — Z79899 Other long term (current) drug therapy: Secondary | ICD-10-CM | POA: Insufficient documentation

## 2015-04-21 DIAGNOSIS — Y902 Blood alcohol level of 40-59 mg/100 ml: Secondary | ICD-10-CM | POA: Diagnosis not present

## 2015-04-21 DIAGNOSIS — F4310 Post-traumatic stress disorder, unspecified: Secondary | ICD-10-CM | POA: Diagnosis present

## 2015-04-21 DIAGNOSIS — Z87442 Personal history of urinary calculi: Secondary | ICD-10-CM | POA: Diagnosis not present

## 2015-04-21 DIAGNOSIS — F17210 Nicotine dependence, cigarettes, uncomplicated: Secondary | ICD-10-CM | POA: Diagnosis present

## 2015-04-21 DIAGNOSIS — F319 Bipolar disorder, unspecified: Secondary | ICD-10-CM | POA: Diagnosis not present

## 2015-04-21 DIAGNOSIS — Z720 Tobacco use: Secondary | ICD-10-CM | POA: Diagnosis not present

## 2015-04-21 DIAGNOSIS — F3164 Bipolar disorder, current episode mixed, severe, with psychotic features: Principal | ICD-10-CM | POA: Diagnosis present

## 2015-04-21 DIAGNOSIS — Z87828 Personal history of other (healed) physical injury and trauma: Secondary | ICD-10-CM | POA: Insufficient documentation

## 2015-04-21 DIAGNOSIS — F1420 Cocaine dependence, uncomplicated: Secondary | ICD-10-CM | POA: Diagnosis present

## 2015-04-21 DIAGNOSIS — F1010 Alcohol abuse, uncomplicated: Secondary | ICD-10-CM | POA: Diagnosis not present

## 2015-04-21 DIAGNOSIS — F250 Schizoaffective disorder, bipolar type: Secondary | ICD-10-CM | POA: Diagnosis present

## 2015-04-21 DIAGNOSIS — R45851 Suicidal ideations: Secondary | ICD-10-CM | POA: Diagnosis present

## 2015-04-21 DIAGNOSIS — Z820 Family history of epilepsy and other diseases of the nervous system: Secondary | ICD-10-CM | POA: Diagnosis not present

## 2015-04-21 DIAGNOSIS — F1020 Alcohol dependence, uncomplicated: Secondary | ICD-10-CM | POA: Diagnosis present

## 2015-04-21 DIAGNOSIS — I10 Essential (primary) hypertension: Secondary | ICD-10-CM | POA: Insufficient documentation

## 2015-04-21 DIAGNOSIS — R45850 Homicidal ideations: Secondary | ICD-10-CM

## 2015-04-21 LAB — CBC
HCT: 44.3 % (ref 39.0–52.0)
Hemoglobin: 14.8 g/dL (ref 13.0–17.0)
MCH: 32.7 pg (ref 26.0–34.0)
MCHC: 33.4 g/dL (ref 30.0–36.0)
MCV: 97.8 fL (ref 78.0–100.0)
PLATELETS: 340 10*3/uL (ref 150–400)
RBC: 4.53 MIL/uL (ref 4.22–5.81)
RDW: 14 % (ref 11.5–15.5)
WBC: 11.5 10*3/uL — ABNORMAL HIGH (ref 4.0–10.5)

## 2015-04-21 LAB — COMPREHENSIVE METABOLIC PANEL
ALT: 52 U/L (ref 0–53)
AST: 62 U/L — ABNORMAL HIGH (ref 0–37)
Albumin: 4.5 g/dL (ref 3.5–5.2)
Alkaline Phosphatase: 89 U/L (ref 39–117)
Anion gap: 11 (ref 5–15)
BILIRUBIN TOTAL: 0.7 mg/dL (ref 0.3–1.2)
BUN: 23 mg/dL (ref 6–23)
CO2: 23 mmol/L (ref 19–32)
CREATININE: 1.28 mg/dL (ref 0.50–1.35)
Calcium: 9 mg/dL (ref 8.4–10.5)
Chloride: 108 mmol/L (ref 96–112)
GFR, EST AFRICAN AMERICAN: 71 mL/min — AB (ref >90)
GFR, EST NON AFRICAN AMERICAN: 61 mL/min — AB (ref >90)
GLUCOSE: 94 mg/dL (ref 70–99)
Potassium: 3.7 mmol/L (ref 3.5–5.1)
SODIUM: 142 mmol/L (ref 135–145)
Total Protein: 7.1 g/dL (ref 6.0–8.3)

## 2015-04-21 LAB — ETHANOL: Alcohol, Ethyl (B): 53 mg/dL — ABNORMAL HIGH (ref 0–9)

## 2015-04-21 LAB — SALICYLATE LEVEL: Salicylate Lvl: <4 mg/dL (ref 2.8–20.0)

## 2015-04-21 LAB — ACETAMINOPHEN LEVEL

## 2015-04-21 MED ORDER — DIPHENHYDRAMINE HCL 25 MG PO CAPS
50.0000 mg | ORAL_CAPSULE | Freq: Once | ORAL | Status: AC
  Administered 2015-04-21: 50 mg via ORAL
  Filled 2015-04-21: qty 2

## 2015-04-21 MED ORDER — LORAZEPAM 1 MG PO TABS: 1.0000 mg | ORAL_TABLET | Freq: Four times a day (QID) | ORAL | Status: DC | PRN

## 2015-04-21 MED ORDER — VITAMIN B-1 100 MG PO TABS
100.0000 mg | ORAL_TABLET | Freq: Every day | ORAL | Status: DC
  Administered 2015-04-22 – 2015-05-03 (×12): 100 mg via ORAL
  Filled 2015-04-21 (×14): qty 1

## 2015-04-21 MED ORDER — ADULT MULTIVITAMIN W/MINERALS CH
1.0000 | ORAL_TABLET | Freq: Every day | ORAL | Status: DC
  Administered 2015-04-22 – 2015-05-03 (×12): 1 via ORAL
  Filled 2015-04-21 (×14): qty 1

## 2015-04-21 MED ORDER — ALUM & MAG HYDROXIDE-SIMETH 200-200-20 MG/5ML PO SUSP: 30.0000 mL | ORAL | Status: DC | PRN

## 2015-04-21 MED ORDER — THIAMINE HCL 100 MG/ML IJ SOLN
100.0000 mg | Freq: Every day | INTRAMUSCULAR | Status: DC
Start: 2015-04-22 — End: 2015-05-03

## 2015-04-21 MED ORDER — LORAZEPAM 1 MG PO TABS
0.0000 mg | ORAL_TABLET | Freq: Four times a day (QID) | ORAL | Status: AC
  Administered 2015-04-22: 2 mg via ORAL
  Administered 2015-04-22 – 2015-04-23 (×2): 1 mg via ORAL
  Filled 2015-04-21: qty 1
  Filled 2015-04-21: qty 2
  Filled 2015-04-21: qty 1

## 2015-04-21 MED ORDER — LORAZEPAM 1 MG PO TABS
0.0000 mg | ORAL_TABLET | Freq: Four times a day (QID) | ORAL | Status: DC
  Administered 2015-04-21: 1 mg via ORAL
  Filled 2015-04-21: qty 1

## 2015-04-21 MED ORDER — MAGNESIUM HYDROXIDE 400 MG/5ML PO SUSP: 30.0000 mL | Freq: Every day | ORAL | Status: DC | PRN

## 2015-04-21 MED ORDER — ACETAMINOPHEN 325 MG PO TABS
650.0000 mg | ORAL_TABLET | Freq: Four times a day (QID) | ORAL | Status: DC | PRN
  Administered 2015-04-29 – 2015-05-03 (×2): 650 mg via ORAL
  Filled 2015-04-21 (×2): qty 2

## 2015-04-21 MED ORDER — SERTRALINE HCL 50 MG PO TABS
50.0000 mg | ORAL_TABLET | Freq: Every day | ORAL | Status: DC
  Administered 2015-04-22: 50 mg via ORAL
  Filled 2015-04-21 (×3): qty 1

## 2015-04-21 MED ORDER — LORAZEPAM 1 MG PO TABS
1.0000 mg | ORAL_TABLET | Freq: Four times a day (QID) | ORAL | Status: AC | PRN
  Administered 2015-04-22: 1 mg via ORAL
  Filled 2015-04-21: qty 1

## 2015-04-21 MED ORDER — SERTRALINE HCL 50 MG PO TABS
50.0000 mg | ORAL_TABLET | Freq: Every day | ORAL | Status: DC
  Administered 2015-04-21: 50 mg via ORAL
  Filled 2015-04-21: qty 1

## 2015-04-21 MED ORDER — FOLIC ACID 1 MG PO TABS: 1.0000 mg | ORAL_TABLET | Freq: Every day | ORAL | Status: DC

## 2015-04-21 MED ORDER — CLONIDINE HCL 0.1 MG PO TABS
0.1000 mg | ORAL_TABLET | Freq: Once | ORAL | Status: DC
  Filled 2015-04-21: qty 1

## 2015-04-21 MED ORDER — ZIPRASIDONE HCL 20 MG PO CAPS
20.0000 mg | ORAL_CAPSULE | Freq: Once | ORAL | Status: AC
  Administered 2015-04-21: 20 mg via ORAL
  Filled 2015-04-21: qty 1

## 2015-04-21 MED ORDER — TRAZODONE HCL 50 MG PO TABS
50.0000 mg | ORAL_TABLET | Freq: Every day | ORAL | Status: DC
  Filled 2015-04-21 (×2): qty 1

## 2015-04-21 MED ORDER — LOPERAMIDE HCL 2 MG PO CAPS
2.0000 mg | ORAL_CAPSULE | Freq: Once | ORAL | Status: AC
  Administered 2015-04-21: 2 mg via ORAL
  Filled 2015-04-21: qty 1

## 2015-04-21 MED ORDER — LORAZEPAM 1 MG PO TABS
0.0000 mg | ORAL_TABLET | Freq: Two times a day (BID) | ORAL | Status: DC
Start: 2015-04-23 — End: 2015-04-21

## 2015-04-21 MED ORDER — ADULT MULTIVITAMIN W/MINERALS CH
1.0000 | ORAL_TABLET | Freq: Every day | ORAL | Status: DC
  Administered 2015-04-21: 1 via ORAL
  Filled 2015-04-21: qty 1

## 2015-04-21 MED ORDER — LORAZEPAM 1 MG PO TABS
2.0000 mg | ORAL_TABLET | Freq: Once | ORAL | Status: DC
  Filled 2015-04-21: qty 2

## 2015-04-21 MED ORDER — CLONIDINE HCL 0.1 MG PO TABS
0.1000 mg | ORAL_TABLET | Freq: Once | ORAL | Status: AC
  Administered 2015-04-21: 0.1 mg via ORAL
  Filled 2015-04-21: qty 1

## 2015-04-21 MED ORDER — OLANZAPINE 10 MG PO TABS: 20.0000 mg | ORAL_TABLET | Freq: Every day | ORAL | Status: DC

## 2015-04-21 MED ORDER — VITAMIN B-1 100 MG PO TABS
100.0000 mg | ORAL_TABLET | Freq: Every day | ORAL | Status: DC
  Administered 2015-04-21: 100 mg via ORAL
  Filled 2015-04-21: qty 1

## 2015-04-21 MED ORDER — THIAMINE HCL 100 MG/ML IJ SOLN: 100.0000 mg | Freq: Every day | INTRAMUSCULAR | Status: DC

## 2015-04-21 MED ORDER — LORAZEPAM 2 MG/ML IJ SOLN: 1.0000 mg | Freq: Four times a day (QID) | INTRAMUSCULAR | Status: DC | PRN

## 2015-04-21 MED ORDER — FOLIC ACID 1 MG PO TABS
1.0000 mg | ORAL_TABLET | Freq: Every day | ORAL | Status: DC
  Administered 2015-04-22 – 2015-05-03 (×12): 1 mg via ORAL
  Filled 2015-04-21 (×13): qty 1

## 2015-04-21 MED ORDER — LORAZEPAM 1 MG PO TABS
2.0000 mg | ORAL_TABLET | Freq: Once | ORAL | Status: AC
  Administered 2015-04-21: 2 mg via ORAL
  Filled 2015-04-21: qty 2

## 2015-04-21 MED ORDER — OLANZAPINE 10 MG PO TABS
20.0000 mg | ORAL_TABLET | Freq: Every day | ORAL | Status: DC
  Filled 2015-04-21 (×2): qty 2

## 2015-04-21 MED ORDER — TRAZODONE HCL 50 MG PO TABS: 50.0000 mg | ORAL_TABLET | Freq: Every day | ORAL | Status: DC

## 2015-04-21 MED ORDER — LORAZEPAM 1 MG PO TABS
0.0000 mg | ORAL_TABLET | Freq: Two times a day (BID) | ORAL | Status: AC
  Administered 2015-04-24 (×2): 2 mg via ORAL
  Administered 2015-04-25: 1 mg via ORAL
  Filled 2015-04-21: qty 1
  Filled 2015-04-21 (×2): qty 2

## 2015-04-21 NOTE — ED Notes (Signed)
[employer] transport requested. 

## 2015-04-21 NOTE — ED Notes (Signed)
Pt refused to give a urine specimen. He was restless and loud on the unit prior to lunch. Pt ate his lunch and took medications falling asleep afterward. Safety maintained.

## 2015-04-21 NOTE — ED Notes (Signed)
D:Pt is threatening saying that he is going to "take the police gun and kill us all." Pt is loud and intrusive of the unit. He is paranoid and delusional.  A:Offered support and redirection. R:Safety maintained.
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
## 2015-04-21 NOTE — BH Assessment (Addendum)
[facility] Jannifer FranklinAkintayo and Nanine MeansJamison Lord, DNP. The attending psychiatrist is Dr. Abelina BachelorEppen. The room assignment is 504-1. Nursing report # [phone]. Patient under IVC (sherriff to transport). [facility].

## 2015-04-21 NOTE — ED Notes (Signed)
Report called to RN Boyd KerbsPenny, [facility], [facility] [facility]. [facility].
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
## 2015-04-21 NOTE — ED Notes (Signed)
Pt resting in bed with eyes closed. RR equal and unlabored. No distress noted. Fifteen minute checks in progress for patient safety. Pt safe on unit. 
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
## 2015-04-21 NOTE — ED Notes (Signed)
Pt has one belongings bag, labeled and searched by security. Placed behind triage nurses station.

## 2015-04-21 NOTE — [facility] Counselor (Signed)
Per Donell SievertSpencer Simon, PA, pt meets inpt criteria. Per Bunnie Pionori, AC, [facility] Elna BreslowEappen. [facility] will call when single room is available later this morning.  Copy of IVC paperwork placed in folder.   Cyndie MullAnna Shelley Pooley, [facility] Triage Specialist
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
## 2015-04-21 NOTE — ED Notes (Signed)
Pt sleeping at present, no distress noted, [facility].  Pt is IVC, monitoring for safety, Q 15 min checks in effect.

## 2015-04-22 ENCOUNTER — Encounter (HOSPITAL_COMMUNITY): Payer: Self-pay | Admitting: Psychiatry

## 2015-04-22 DIAGNOSIS — F1420 Cocaine dependence, uncomplicated: Secondary | ICD-10-CM | POA: Diagnosis present

## 2015-04-22 DIAGNOSIS — F1020 Alcohol dependence, uncomplicated: Secondary | ICD-10-CM | POA: Diagnosis present

## 2015-04-22 DIAGNOSIS — F1220 Cannabis dependence, uncomplicated: Secondary | ICD-10-CM | POA: Diagnosis present

## 2015-04-22 DIAGNOSIS — F3164 Bipolar disorder, current episode mixed, severe, with psychotic features: Secondary | ICD-10-CM | POA: Diagnosis present

## 2015-04-22 MED ORDER — NICOTINE POLACRILEX 2 MG MT GUM
2.0000 mg | CHEWING_GUM | OROMUCOSAL | Status: DC | PRN
  Administered 2015-04-23 – 2015-05-03 (×14): 2 mg via ORAL
  Filled 2015-04-22 (×13): qty 1

## 2015-04-22 MED ORDER — RISPERIDONE 2 MG PO TBDP
2.0000 mg | ORAL_TABLET | Freq: Three times a day (TID) | ORAL | Status: DC | PRN
  Administered 2015-04-22 – 2015-04-27 (×4): 2 mg via ORAL
  Filled 2015-04-22 (×5): qty 2

## 2015-04-22 MED ORDER — PNEUMOCOCCAL VAC POLYVALENT 25 MCG/0.5ML IJ INJ: 0.5000 mL | INJECTION | INTRAMUSCULAR | Status: DC

## 2015-04-22 MED ORDER — ZIPRASIDONE MESYLATE 20 MG IM SOLR: 20.0000 mg | INTRAMUSCULAR | Status: DC | PRN

## 2015-04-22 MED ORDER — BENZTROPINE MESYLATE 0.5 MG PO TABS
0.5000 mg | ORAL_TABLET | ORAL | Status: DC
  Administered 2015-04-22 – 2015-04-27 (×11): 0.5 mg via ORAL
  Filled 2015-04-22 (×13): qty 1

## 2015-04-22 MED ORDER — LORAZEPAM 1 MG PO TABS
1.0000 mg | ORAL_TABLET | ORAL | Status: AC | PRN
  Administered 2015-04-26: 1 mg via ORAL
  Filled 2015-04-22: qty 1

## 2015-04-22 MED ORDER — OXCARBAZEPINE 150 MG PO TABS
150.0000 mg | ORAL_TABLET | Freq: Two times a day (BID) | ORAL | Status: DC
  Administered 2015-04-22 – 2015-04-29 (×15): 150 mg via ORAL
  Filled 2015-04-22 (×17): qty 1

## 2015-04-22 MED ORDER — GABAPENTIN 100 MG PO CAPS
100.0000 mg | ORAL_CAPSULE | Freq: Three times a day (TID) | ORAL | Status: DC
  Administered 2015-04-22 – 2015-04-27 (×15): 100 mg via ORAL
  Filled 2015-04-22 (×19): qty 1

## 2015-04-22 MED ORDER — FLUPHENAZINE HCL 5 MG PO TABS
5.0000 mg | ORAL_TABLET | ORAL | Status: DC
  Administered 2015-04-22 (×2): 5 mg via ORAL
  Filled 2015-04-22 (×5): qty 1

## 2015-04-22 MED ORDER — SERTRALINE HCL 25 MG PO TABS
25.0000 mg | ORAL_TABLET | Freq: Every day | ORAL | Status: DC
  Administered 2015-04-23: 25 mg via ORAL
  Filled 2015-04-22 (×2): qty 1

## 2015-04-22 MED ORDER — TRAZODONE HCL 100 MG PO TABS
100.0000 mg | ORAL_TABLET | Freq: Every day | ORAL | Status: DC
  Administered 2015-04-22 – 2015-04-25 (×4): 100 mg via ORAL
  Filled 2015-04-22 (×5): qty 1

## 2015-04-22 NOTE — Progress Notes (Signed)
Patient in bed all evening. Did not attend Karaoke due to been asleep. Writer woke patient up and offered him his bedtime medications. Patient woke up and was talking about shooting some birds. He seemed confused and asked Clinical research associatewriter [place]. He seemed manic and hyper verbal. Writer offered patient snacks and encouraged him to go back and sleep. He said he has been sleeping all evening didn't want to lay down. Writer encouraged and supported patient. He was receptive to encouragement and support. [facility] and that the staff are very nice and kind. Q 15 minute check continues as ordered to maintain safety.

## 2015-04-22 NOTE — Tx Team (Signed)
Initial Interdisciplinary Treatment Plan   PATIENT STRESSORS: Financial difficulties Substance abuse   PATIENT STRENGTHS: Active sense of humor General fund of knowledge Work skills   PROBLEM LIST: Problem List/Patient Goals Date to be addressed Date deferred Reason deferred Estimated date of resolution  " I have schizophrenia the paranoid type" 4/27     "21 people were after him" 4/27     Etoh abuse 4/27                                          DISCHARGE CRITERIA:  Improved stabilization in mood, thinking, and/or behavior Motivation to continue treatment in a less acute level of care Need for constant or close observation no longer present Reduction of life-threatening or endangering symptoms to within safe limits Withdrawal symptoms are absent or subacute and managed without 24-hour nursing intervention  PRELIMINARY DISCHARGE PLAN: Attend PHP/IOP Attend 12-step recovery group  PATIENT/FAMIILY INVOLVEMENT: This treatment plan has been presented to and reviewed with the patient, Stephen Delacruz.  The patient and family have been given the opportunity to ask questions and make suggestions.  Stephen Delacruz A 04/22/2015, 6:42 AM
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
## 2015-04-22 NOTE — [facility] Group Notes (Signed)
[facility] Group Notes:  (Counselor/Nursing/MHT/Case Management/Adjunct)  04/22/2015 1:15PM  Type of Therapy:  Group Therapy  Participation Level:  Active  Participation Quality:  Appropriate  Affect:  Flat  Cognitive:  Oriented  Insight:  Improving  Engagement in Group:  Limited  Engagement in Therapy:  Limited  Modes of Intervention:  Discussion, Exploration and Socialization  Summary of Progress/Problems: The topic for group was balance in life.  Pt participated in the discussion about when their life was in balance and out of balance and how this feels.  Pt discussed ways to get back in balance and short term goals they can work on to get where they want to be. Required constant redirection.  Eventually willing to put up hand and wait to be called on.  Pressured, tangential, grandiose.  Graphically described how he feels that he is being screwed by life and everyone with whom he comes in contact.  Left the room and returned several times.     Stephen Delacruz, Stephen Delacruz B 04/22/2015 1:19 PM
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
## 2015-04-22 NOTE — Progress Notes (Signed)
Patient ID: Stephen Delacruz, male   DOB: 07/16/1956, 59 y.o.   MRN: [id number] PER STATE REGULATIONS 482.30  THIS CHART WAS REVIEWED FOR MEDICAL NECESSITY WITH RESPECT TO THE PATIENT'S ADMISSION/DURATION OF STAY.  NEXT REVIEW DATE:04/25/15  Loura HaltBARBARA Hussam Muniz, RN, BSN CASE MANAGER

## 2015-04-22 NOTE — Progress Notes (Signed)
Stephen Delacruz has done a good job today handling his manic behaviors and labile,feelings. His mania is evidenced by his hyperverbal communication, his intrusiveness and inability to wait, his poor insight and his disorganized speech with flight of ideas.   A He has spent his time today between trying to get in touch with his sister ( via phone) and rehearsing events that surrounded his admission here to this hospital. His behavior can be labile and he is observed talking to this nurse and then, within 15 seconds he became loud, out of control and angry and screaming. He has little to no reaction to po ativan and MD ordered risperdal m tab for agitation this evening.   R Safety in place. Pt contracts for safety.

## 2015-04-22 NOTE — Progress Notes (Signed)
Nutrition Brief Note  Patient identified on the Malnutrition Screening Tool (MST) Report  Per pt he has been trying to lose weight at home by exercise and healthy eating. Currently has good appetite.   Wt Readings from Last 15 Encounters:  04/21/15 190 lb (86.183 kg)  11/06/13 180 lb (81.647 kg)    Body mass index is 31.62 kg/(m^2). Patient meets criteria for Obesity class I based on current BMI.   Current diet order is Heart Healthy, patient is consuming approximately 100% of meals at this time. Labs and medications reviewed.   No nutrition interventions warranted at this time. If nutrition issues arise, please consult RD.   Stephen BaneHeather Treena Delacruz RD, LDN, CNSC [phone] Pager [phone] After Hours Pager
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
## 2015-04-23 MED ORDER — FLUPHENAZINE HCL 2.5 MG PO TABS: 2.5000 mg | ORAL_TABLET | Freq: Every day | ORAL | Status: DC

## 2015-04-23 MED ORDER — FLUPHENAZINE HCL 2.5 MG PO TABS
2.5000 mg | ORAL_TABLET | ORAL | Status: DC
  Administered 2015-04-23 – 2015-04-27 (×5): 2.5 mg via ORAL
  Filled 2015-04-23 (×6): qty 1

## 2015-04-23 MED ORDER — FLUPHENAZINE HCL 5 MG PO TABS
5.0000 mg | ORAL_TABLET | Freq: Every day | ORAL | Status: DC
  Administered 2015-04-23 – 2015-04-25 (×3): 5 mg via ORAL
  Filled 2015-04-23 (×4): qty 1

## 2015-04-23 NOTE — [facility] Group Notes (Signed)
[facility]   04/23/2015 10:45 AM  Participation Quality:  Although he appeared sedated, Stephen Delacruz is also pressured and unable to sit quietly.  His mood was fine until I dismissed him from group for continued interruptions, and he than became irritable and angry, but ultimately complied with my request to leave.    Daryel GeraldNorth, Ngoc Detjen B
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
## 2015-04-23 NOTE — Progress Notes (Signed)
Did not attend karaoke group Thursday night.  Lindalou Hoseiamond H. 04/22/2015

## 2015-04-23 NOTE — Progress Notes (Signed)
Patient in his room during this assessment. He remains mildly confused and speech still slurred. Patient endorsed a good day. Denied SI/Hi and denied hallucinations. He requested for nicorette gum. Writer encouraged and supported patient. Q 15 minute check continues as ordered to maintain safety.
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
## 2015-04-23 NOTE — Progress Notes (Signed)
Adult Psychoeducational Group Note  Date:  04/23/2015 Time:  8:51 PM  Group Topic/Focus:  Wrap-Up Group:   The focus of this group is to help patients review their daily goal of treatment and discuss progress on daily workbooks.  Participation Level:  Active  Participation Quality:  Appropriate  Affect:  Appropriate  Cognitive:  Appropriate  Insight: Appropriate  Engagement in Group:  Engaged  Modes of Intervention:  Discussion  Additional Comments: The patient expressed that he learned to try to help people more.The patient also siad that he learn to take care of hisself first.  Octavio Mannshigpen, Arwyn Besaw Lee 04/23/2015, 8:51 PM

## 2015-04-23 NOTE — Progress Notes (Signed)
[name] has been much calmer today as evidenced by him not being as hyperverbal with staff, he doesn't move around as fast ( pacing, roaming from room to room) and he doesn't talk as fast , as loud and as much today. He has an agitated undercurrent.Marland Kitchen.aortic stenosis it were....but he is better able to calmly articulate his feelings as opposed to speaking aggressively .    A He takes his meds as scheduled and he attends his groups. HE is VERY thick tongued and Dr. Elna BreslowEappen is made aware. HE ambulates well but is seen shaking his head to get his orientation.  His meds are adjusted per Dr. Elna BreslowEappen.    R He completes his morning assessment and on it he wrote he deneid SI and he rated his depression, hopelessness and anxiety " 06/26/09", respectively.
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
## 2015-04-24 ENCOUNTER — Encounter (HOSPITAL_COMMUNITY): Payer: Self-pay | Admitting: Registered Nurse

## 2015-04-24 LAB — LIPID PANEL
CHOL/HDL RATIO: 5.2 ratio
Cholesterol: 170 mg/dL (ref 0–200)
HDL: 33 mg/dL — ABNORMAL LOW (ref >39)
LDL Cholesterol: 97 mg/dL (ref 0–99)
TRIGLYCERIDES: 202 mg/dL — AB (ref <150)
VLDL: 40 mg/dL (ref 0–40)

## 2015-04-24 LAB — RAPID URINE DRUG SCREEN, HOSP PERFORMED
Amphetamines: NOT DETECTED
Barbiturates: NOT DETECTED
Benzodiazepines: POSITIVE — AB
Cocaine: NOT DETECTED
Opiates: NOT DETECTED
Tetrahydrocannabinol: NOT DETECTED

## 2015-04-24 LAB — TSH: TSH: 2.627 u[IU]/mL (ref 0.350–4.500)

## 2015-04-24 NOTE — Progress Notes (Signed)
Patient ID: Stephen Delacruz, male   DOB: 08/15/1956, 59 y.o.   MRN: [id number]  D: Pt has been very intrusive on the unit today, he has also been very monopolizing of everybody's time staff and patients. At one point the patient attempted to stand at the med window to ask other patients about their medication. When this writer attempted to redirect patient he got upset and started cursing. Pt has no insight and is not vested in treatment. Pt reported that his depression was a 2, his hopelessness was a 2, and his anxiety was a 7. Pt reported that his goal was to do whatever he had to do to help hisself. Pt reported being negative SI/HI, no AH/VH noted. A: 15 min checks continued for patient safety. R: Pt safety maintained.

## 2015-04-24 NOTE — [facility] Group Notes (Signed)
[facility] Group Notes:  (Nursing/MHT/Case Management/Adjunct)  Date:  04/24/2015  Time:  11:40 AM  Type of Therapy:  Psychoeducational Skills  Participation Level:  Active  Participation Quality:  Appropriate  Affect:  Appropriate  Cognitive:  Appropriate  Insight:  Appropriate  Engagement in Group:  Engaged  Modes of Intervention:  Discussion  Summary of Progress/Problems: Pt did attend self inventory group.  Jacquelyne BalintForrest, Jaelin Fackler Shanta 04/24/2015, 11:40 AM

## 2015-04-24 NOTE — Progress Notes (Signed)
Adult Psychoeducational Group Note  Date:  04/24/2015 Time:  8:48 PM  Group Topic/Focus:  Wrap-Up Group:   The focus of this group is to help patients review their daily goal of treatment and discuss progress on daily workbooks.  Participation Level:  Active  Participation Quality:  Intrusive  Affect:  Appropriate  Cognitive:  Disorganized  Insight: Lacking  Engagement in Group:  Distracting  Modes of Intervention:  Discussion  Additional Comments:  Stephen Delacruz was inappropriate and disorganized. He stated that his day has been up and down today. He was upset that the doctor didn't send much time talking to him about his medications. He also stated that the staff is great.   Flonnie HailstoneCOOKE, Jatoria Kneeland R 04/24/2015, 8:48 PM

## 2015-04-24 NOTE — [facility] Group Notes (Signed)
[facility] Group Notes:  (Clinical Social Work)  04/24/2015  11:15-12:00PM  Summary of Progress/Problems:   The main focus of today's process group was to discuss patients' feelings related to being hospitalized, as well as the difference between "being" and "having" a mental health diagnosis.  It was agreed in general by the group that it would be preferable to avoid future hospitalizations, and we discussed means of doing that.  As a follow-up, problems with adhering to medication recommendations were discussed.  The patient expressed their primary feeling about being hospitalized is "total mistake, frustrated, not listened to, anxious, misunderstood by civilians."  Later he stated that security at this facility is lacking, and that we need "tasers and guns."  He said his small cat is stuck in his house without food, is an outside cat normally and cannot get out, says he wants the phone numbers out of his phone to get somebody to take care of the cat.  He feels the staff "shoos me away instead of listening to me."  He had his hand up to speak almost non-stop throughout group.  Type of Therapy:  Group Therapy - Process  Participation Level:  Active  Participation Quality:  Attentive, Monopolizing, Redirectable and Sharing  Affect:  Not Congruent  Cognitive:  Alert  Insight:  Limited  Engagement in Therapy:  Engaged  Modes of Intervention:  Exploration, Discussion  Ambrose MantleMareida Grossman-Orr, LCSW 04/24/2015, 12:21 PM
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
## 2015-04-24 NOTE — Progress Notes (Signed)
Patient ID: Stephen Delacruz, male   DOB: 10/16/1956, 59 y.o.   MRN: [id number]   D: Pt continues to be flirtatious, intrusive and inappropriate at times. After being redirected by the writer pt became agitated and carried the anger with him into the group setting. Pt appears to be drowsy throughout the shift as reported from the previous RN. When asked about his day pt stated, "it was good".  When asked to give writer info about his conversation with his dr, pt stated, "Dr E was pretty impressed with me". Pt states he [facility]. Pt voiced no questions or concerns.  A:  Support and encouragement was offered. 15 min checks continued for safety.  R: Pt remains safe.
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
## 2015-04-25 NOTE — [facility] Group Notes (Signed)
[facility] Group Notes:  (Clinical Social Work)  04/25/2015  [facility] Group Notes:  (Clinical Social Work)  04/25/2015  11:00AM-12:00PM  Summary of Progress/Problems:  The main focus of today's process group was to listen to a variety of genres of music and to identify that different types of music provoke different responses.  The patient then was able to identify personally what was soothing for them, as well as energizing.  Handouts were used to record feelings evoked, as well as how patient can personally use this knowledge in sleep habits, with depression, and with other symptoms.  The patient expressed a variety of feelings and thoughts throughout group, often not on topic.  He talked constantly with another patient regardless of being asked multiple times to listen/feel the music.  His affect was often not congruent with his stated feelings.  Type of Therapy:  Music Therapy   Participation Level:  Active  Participation Quality:  Inattentive and Sharing, Intrusive, Not Redirectable  Affect:  Not Congruent  Cognitive:  Disorganized, Delusional  Insight:  Limited  Engagement in Therapy:  Limited  Modes of Intervention:   Activity, Exploration  Ambrose MantleMareida Grossman-Orr, LCSW 04/25/2015

## 2015-04-25 NOTE — [facility] Group Notes (Addendum)
[facility] Group Notes:  (Nursing/MHT/Case Management/Adjunct)  Date:  04/25/2015  Time:  3:01 PM  Type of Therapy:  Psychoeducational Skills  Participation Level:  Active  Participation Quality:  Intrusive and Monopolizing  Affect:  Blunted  Cognitive:  Disorganized and Lacking  Insight:  Lacking  Engagement in Group:  Distracting and Lacking  Modes of Intervention:  Problem-solving  Summary of Progress/Problems: Pt did attend patient self inventory group.   Jacquelyne BalintForrest, Camilia Caywood Shanta 04/25/2015, 3:01 PM

## 2015-04-25 NOTE — Progress Notes (Signed)
Psychoeducational Group Note  Date:  04/25/2015 Time:  2045  Group Topic/Focus:  Wrap-Up Group:   The focus of this group is to help patients review their daily goal of treatment and discuss progress on daily workbooks.  Participation Level: Did Not Attend  Participation Quality:  Not Applicable  Affect:  Not Applicable  Cognitive:  Not Applicable  Insight:  Not Applicable  Engagement in Group: Not Applicable  Additional Comments:  The patient did not attend group this evening.   Hazle CocaGOODMAN, Cornella Emmer S 04/25/2015, 8:44 PM
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
## 2015-04-26 LAB — HEMOGLOBIN A1C
Hgb A1c MFr Bld: 5.5 % (ref 4.8–5.6)
Mean Plasma Glucose: 111 mg/dL

## 2015-04-26 MED ORDER — TRAZODONE HCL 50 MG PO TABS
50.0000 mg | ORAL_TABLET | Freq: Every day | ORAL | Status: DC
  Administered 2015-04-26: 50 mg via ORAL
  Filled 2015-04-26 (×2): qty 1

## 2015-04-26 MED ORDER — FLUPHENAZINE HCL 5 MG PO TABS
5.0000 mg | ORAL_TABLET | Freq: Every evening | ORAL | Status: DC
  Administered 2015-04-26: 5 mg via ORAL
  Filled 2015-04-26 (×2): qty 1
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
## 2015-04-26 NOTE — Progress Notes (Signed)
Patient ID: Stephen Delacruz, male   DOB: 10/14/1956, 59 y.o.   MRN: [id number] PER STATE REGULATIONS 482.30  THIS CHART WAS REVIEWED FOR MEDICAL NECESSITY WITH RESPECT TO THE PATIENT'S ADMISSION/ DURATION OF STAY.  NEXT REVIEW DATE:  04/29/2015  Willa RoughJENNIFER JONES Otelia Hettinger, RN, BSN CASE MANAGER

## 2015-04-26 NOTE — Progress Notes (Signed)
D: Pt visible in milieu, observed interacting well with peers and staff at this time. Behavioral outburst X 1 this AM, pt stated "y'all lied to me, the doctor told me I'm going home tomorrow and the social worker told me I'm going home today, I'm tired of y'all bullshit". Pt began verbally abusive towards staff and was demanding immediate d/c. Off unit to courtyard for leisure time and to cafeteria with peers for meals. Pt appears sedated with slurred speech at present.  A: Shift assessment completed. All medications administered as ordered, including PRN Risperdal 2 mg Po at 1107 for agitation and  Ativan 1 mg PO at 1139 for continued agitation aeb increased verbal aggression / threats towards staff, kicking double doors multiple times demanding to leave, banging on counter at nursing station. Verbal encouragement offered to pt to allow him voice his concerns peacefully but to no avail. Safety maintained on Q 15 minutes checks as ordered for safety.  R: Pt denied HI, pain & AVH when assessed. Verbally contracted for safety with SI; agreed to approach staff when he does have thoughts of self harm to promote safety. Pt attended groups as scheduled. Remains medication compliant. Exhibit no further behavioral outburst thus far since his last episode this Am. Safety maintained.

## 2015-04-26 NOTE — [facility] Group Notes (Signed)
[facility]   04/26/2015 3:02 PM  Participation Quality:  Minimal  Mood/Affect:  Sedated  Depression Rating:  denies  Anxiety Rating:  denies  Thoughts of Suicide:  No Will you contract for safety?   NA  Current AVH:  denies  Plan for Discharge/Comments:  Stephen Delacruz knows he is slurring and sedated, but does not seem overly bothered by it.  Is insistent that he is leaving today.  "I've seen 4 docs today, and everyone told me I was good to go."  Stated that he and I have something in common.  "We are both loquacious."  Confirms that he sees Stephen Delacruz.  Transportation Means: bus  Supports: family [minimally]  [place], BurtRodney Delacruz

## 2015-04-26 NOTE — Progress Notes (Signed)
The focus of this group is to help patients review their daily goal of treatment and discuss progress on daily workbooks. Pt attended the evening group session, but was unable to respond on-topic to discussion prompts from the Writer. Pt spoke out of turn, frequently interrupting the Writer and his peers, appearing to have little response to attempts at redirection. Pt's speech was noticeably slurred and he appeared sleepy. He repeatedly said out loud, "This place sucks! This place is a prison!"
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
## 2015-04-26 NOTE — Plan of Care (Signed)
Problem: Aggression Towards others,Towards Self, and or Destruction Goal: STG-Patient will comply with prescribed medication regimen (Patient will comply with prescribed medication regimen)  Outcome: Progressing Pt compliant with ordered medications when offered.

## 2015-04-26 NOTE — Progress Notes (Signed)
Recreation Therapy Notes  05.02.2016 @ approximately 3:00pm. LRT attempted to work with patient, however patient refused recreation therapy tx at this time. Patient did express a desire to go outside, LRT volunteered to walk patient outside to courtyard to join rest of unit. LRT and RN escorted patient down to courtyard.   Patient presented with very slurred speech and intoxicated appearance. Patient attempted to engage in conversation while walking to courtyard, however due to extent of slurred speech LRT has difficult time understanding patient. Patient sang songs in the elevator on the way down to the courtyard. Upon getting to the courtyard patient was observed to immediatly interact with other patients from the unit.   MHT notified patient was brought to courtyard.   Marykay Lexenise L Zoiee Wimmer, LRT/CTRS  Jearl KlinefelterBlanchfield, Chiquita Heckert L 04/26/2015 4:31 PM

## 2015-04-27 MED ORDER — LORAZEPAM 1 MG PO TABS: 1.0000 mg | ORAL_TABLET | ORAL | Status: DC | PRN

## 2015-04-27 MED ORDER — BENZTROPINE MESYLATE 0.5 MG PO TABS: 0.5000 mg | ORAL_TABLET | Freq: Every day | ORAL | Status: DC

## 2015-04-27 MED ORDER — GABAPENTIN 100 MG PO CAPS: 100.0000 mg | ORAL_CAPSULE | Freq: Two times a day (BID) | ORAL | Status: DC

## 2015-04-27 MED ORDER — QUETIAPINE FUMARATE 100 MG PO TABS
100.0000 mg | ORAL_TABLET | Freq: Every day | ORAL | Status: DC
  Administered 2015-04-27 – 2015-04-28 (×2): 100 mg via ORAL
  Filled 2015-04-27 (×3): qty 1

## 2015-04-27 MED ORDER — QUETIAPINE FUMARATE 50 MG PO TABS
50.0000 mg | ORAL_TABLET | Freq: Three times a day (TID) | ORAL | Status: DC | PRN
  Administered 2015-04-27: 50 mg via ORAL
  Administered 2015-04-28: 25 mg via ORAL
  Administered 2015-04-28: 50 mg via ORAL
  Filled 2015-04-27 (×5): qty 1

## 2015-04-27 MED ORDER — TRAZODONE HCL 100 MG PO TABS
100.0000 mg | ORAL_TABLET | Freq: Every day | ORAL | Status: DC
  Filled 2015-04-27: qty 1

## 2015-04-27 NOTE — Progress Notes (Signed)
Pt became agitated when CIRT alarms remained on for several minutes. He said the alarms were "giving me flashbacks." He walked to the unit door and pushed/pulled to get out, striking the door and speaking with a raised voiced. PRN Seroquel 50 mg PO given, with results pending. Pt is now in group with safety sitter, acting appropriately.

## 2015-04-27 NOTE — Progress Notes (Signed)
1:1 note  Pt is resting quietly in room, with eyes closed and regular/even respirations. Safety sitter present. Pt remains free from harm. He has been unsteady on his feet while awake. Will continue to monitor for needs/safety.

## 2015-04-27 NOTE — Progress Notes (Signed)
Recreation Therapy Notes  05.03.2016 @ approximately 4:00pm. LRT attempted to complete 1:1 with patient, however per MHT and RN report patient sleeping. Due to patient need for sleep at this time no 1:1 completed.   Marykay Lexenise L Taylon Louison, LRT/CTRS  Jearl KlinefelterBlanchfield, Ronelle Michie L 04/27/2015 4:28 PM

## 2015-04-27 NOTE — Progress Notes (Signed)
Nutrition Education Note  RD consulted for nutrition education regarding hyperlipidemia.   Lipid Panel     Component Value Date/Time   CHOL 170 04/24/2015 0630   TRIG 202* 04/24/2015 0630   HDL 33* 04/24/2015 0630   CHOLHDL 5.2 04/24/2015 0630   VLDL 40 04/24/2015 0630   LDLCALC 97 04/24/2015 0630    RD provided "High Cholesterol Diet Therapy" handout from the Academy of Nutrition and Dietetics. Reviewed patient's dietary recall. Provided examples on ways to decrease fat intake in diet. Discouraged intake of processed foods and limiting fried foods. Encouraged fresh fruits and vegetables as well as whole grain sources of carbohydrates to maximize fiber intake. Teach back method used. Pt in room with 1:1 sitter and did not seem interested/appropriate for education.   Expect poor compliance.  Body mass index is 31.62 kg/(m^2). Pt meets criteria for obesity based on current BMI.  Current diet order is Heart Healthy, patient is consuming meals and snacks ad lib. Labs and medications reviewed. No further nutrition interventions warranted at this time. RD contact information provided.  Trenton GammonJessica Cherryl Babin, RD, LDN Inpatient Clinical Dietitian Pager # [phone] After hours/weekend pager # [phone]

## 2015-04-27 NOTE — Progress Notes (Signed)
Patient ID: Stephen Delacruz, male   DOB: 07/07/1956, 59 y.o.   MRN: [id number] D: Client in bed most of this shift, awaken for medications and snack. Client mildly confused upon waking, "wait a minute let me get myself together" "been sleeping all day with that medicine" A: Writer introduced self to client, reviewed medications, administered as ordered. Client given a snack encouraged to report any concerns to Clinical research associatewriter or staff. Staff will monitor 1:1 for safety.  R: Client is safe on the unit, did not attend group, takes medications.

## 2015-04-27 NOTE — Progress Notes (Signed)
D: Pt denies SI/HI/pain. When asked about AVH, he vaguely endorses seeing things but his speech is tangential and he has been unable to describe what he's seeing. Seems to be a poor historian. His behavior has been largely appropriate on unit except for when he banged on unit door because the CIRT alarm was bothering him. A: Medication given as ordered, including PRN Seroquel for agitation, which he said helped minimally. Q15 safety checks maintained. Support/encouragement provided. R: Pt remains safe and continues with treatment. Will monitor for needs/safety.

## 2015-04-27 NOTE — Progress Notes (Signed)
1:1 note  Pt is resting quietly in room, with eyes closed and regular/even respirations. He remains free from harm with safety sitter present. Will continue to monitor for needs/safety.

## 2015-04-27 NOTE — [facility] Group Notes (Signed)
[facility] LCSW Group Therapy  04/27/2015 , 1:22 PM   Type of Therapy:  Group Therapy  Participation Level:  Active  Participation Quality:  Attentive  Affect:  Appropriate  Cognitive:  Alert  Insight:  Improving  Engagement in Therapy:  Engaged  Modes of Intervention:  Discussion, Exploration and Socialization  Summary of Progress/Problems: Today's group focused on the term Diagnosis.  Participants were asked to define the term, and then pronounce whether it is a negative, positive or neutral term.  Attended and participated throughout.  Able to track, contribute and make jokes.  Acknowledges his diagnosis and talked at length about how it has affected relationship with family.  Long-winded and somewhat tangential, but able to come back to original question eventually.  Continues to slur and act sedated.  Stephen Delacruz, Stephen Delacruz B 04/27/2015 , 1:22 PM
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
## 2015-04-27 NOTE — [facility] Group Notes (Signed)
[facility] Group Notes:  (Nursing/MHT/Case Management/Adjunct)  Date:  04/27/2015  Time:  9:53 AM  Type of Therapy:  Nurse Education  Participation Level:  Did Not Attend  Participation Quality:  Did Not Attend  Affect:    Cognitive:    Insight:    Engagement in Group:    Modes of Intervention:    Summary of Progress/Problems: Pt did not wake up for group.   Maurine SimmeringShugart, Zyshonne Malecha M 04/27/2015, 9:53 AM

## 2015-04-27 NOTE — Progress Notes (Signed)
1:1 note  Pt is resting quietly in room, with eyes closed and regular/even respirations. Pt's behavior has been appropriate today. He has not been aggressive as he was yesterday, nor is he going in pt's rooms as he was last night. His speech is slurred and he appears slightly unsteady on his feet. He denies SI/HI/AVH and pain, but his speech can be tangential and hard to understand, in addition to the slurring. He can be flirtatious with staff but is redirectable. He took a.m. medications with no issue. Will continue to monitor for needs and safety.
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
## 2015-04-28 MED ORDER — HYDROXYZINE HCL 50 MG PO TABS
50.0000 mg | ORAL_TABLET | Freq: Four times a day (QID) | ORAL | Status: DC | PRN
  Administered 2015-04-28 – 2015-05-02 (×6): 50 mg via ORAL
  Filled 2015-04-28 (×6): qty 1

## 2015-04-28 MED ORDER — HYDROXYZINE HCL 25 MG PO TABS
25.0000 mg | ORAL_TABLET | Freq: Three times a day (TID) | ORAL | Status: DC | PRN
  Administered 2015-04-28: 25 mg via ORAL
  Filled 2015-04-28: qty 1

## 2015-04-28 MED ORDER — QUETIAPINE FUMARATE 25 MG PO TABS
25.0000 mg | ORAL_TABLET | Freq: Three times a day (TID) | ORAL | Status: DC
  Administered 2015-04-29: 25 mg via ORAL
  Filled 2015-04-28 (×6): qty 1

## 2015-04-28 MED ORDER — QUETIAPINE FUMARATE 25 MG PO TABS
25.0000 mg | ORAL_TABLET | Freq: Every morning | ORAL | Status: DC
  Administered 2015-04-28: 25 mg via ORAL
  Filled 2015-04-28: qty 1

## 2015-04-28 NOTE — Progress Notes (Signed)
Adult Psychoeducational Group Note  Date:  04/28/2015 Time:  9:58 PM  Group Topic/Focus:  Wrap-Up Group:   The focus of this group is to help patients review their daily goal of treatment and discuss progress on daily workbooks.  Participation Level:  Active  Participation Quality:  Intrusive  Affect:  Labile  Cognitive:  Lacking  Insight: Limited  Engagement in Group:  Engaged  Modes of Intervention:  Socialization and Support  Additional Comments:  Patient attended and participated in group tonight. He reports that his day was up and down. Today he was taken off his 1:1. He went outside, he went for groups and meals. He had a very good time socializing with peers.  Lita MainsFrancis, Vallory Oetken [facility] 04/28/2015, 9:58 PM

## 2015-04-28 NOTE — Progress Notes (Signed)
D:Patient is hyper verbal and intrusive.  Patient has to redirected several times and gets upset if he is not the center of attention.  Patient has to remember to work on himself instead of intruding on other patients personal matters.  Patient is demeaning to staff but then comes back to apologize later.  Patient denies SI/HI and denies AVH.  Patient has been threatening to staff and balling up his fists tonight.  Patient states he is in control and does not have to follow the rules of the unit. A: Staff to monitor Q 15 mins for safety.  Encouragement and support offered.  Scheduled medications administered per orders.  Prn medications administered tonight. R: Patient remains safe on the unit.  Patient attended group tonight.  Patient visible on the unit and interacting with peers.  Patient taking administered medications.

## 2015-04-28 NOTE — Plan of Care (Signed)
Problem: Alteration in mood & ability to function due to Goal: STG: Patient verbalizes decreases in signs of withdrawal Patient reports no signs of withdrawal, vital signs stable. Rod North, LCSW  Outcome: Progressing Patient denies physical complaints, no withdrawal. CIWAs d/c per protocol as they have been <4 x 24 hours.  Problem: Ineffective individual coping Goal: STG: Patient will remain free from self harm Outcome: Progressing Patient has not engaged in self harm and denies SI
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
## 2015-04-28 NOTE — Progress Notes (Signed)
[facility] Post 1:1 Observation Documentation  For the first (8) hours following discontinuation of 1:1 precautions, a progress note entry by nursing staff should be documented at least every 2 hours, reflecting the patient's behavior, condition, mood, and conversation.  Use the progress notes for additional entries.  Time 1:1 discontinued:  1030  Time of note: 1430  Patient's Behavior:  Patient at nurses' station frequently asking questions.  Patient's Condition:  Confused, states he is agitated however redirectable.  Patient's Conversation:  Tangential, repetitive.  Merian CapronFriedman, Riel Hirschman [facility] 04/28/2015, 1430

## 2015-04-28 NOTE — [facility] Group Notes (Signed)
[facility]  04/28/2015 , 11:10 AM    Type of Therapy:  [facility] [employer] Presentation  Participation Level:  Active  Participation Quality:  Attentive  Affect:  Blunted  Cognitive:  Oriented  Insight:  Limited  Engagement in Therapy:  Engaged  Modes of Intervention:  Discussion, Education and Socialization  Summary of Progress/Problems:  Stephen Delacruz from [employer] came to present his recovery story and play the guitar.  Continues to show progress in ability to arrest self when interrupting, which he continues to do regularly. Consequently, required minimal redirection and intervention.    [name], [name] B 04/28/2015 , 11:10 AM

## 2015-04-28 NOTE — Plan of Care (Signed)
Problem: Aggression Towards others,Towards Self, and or Destruction Goal: STG-Patient will comply with prescribed medication regimen (Patient will comply with prescribed medication regimen)  Outcome: Progressing Client is compliant with medication regimen AEB taking medication with no conflicts.

## 2015-04-28 NOTE — Progress Notes (Signed)
Patient calmer this morning. Pleasant thus far. He does remain confused and tangential. 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 Mood somewhat elevated. Rates depression, hopelessness both at a 0/10 however anxiety at a 9.5/10. States he is anxious to discharge. Does endorse racing thoughts and patient was encouraged to speak with MD.States his pain (back, fingers) is a 10/10 but refuses tylenol. Patient medicated per orders. Support and reassurance given. Discussed patient's status with MD and treatment team. On 1:1 obs due to unpredictable, impulsive behavior.  He denies SI/HI/AVH. Remains safe on 1:1. Lawrence MarseillesFriedman, Consuelo Suthers Eakes

## 2015-04-28 NOTE — [facility] Group Notes (Signed)
[facility]   04/28/2015 11:08 AM  Participation Quality:  Engaged  Mood/Affect:  Appropriate  Depression Rating:  denies  Anxiety Rating:  denies  Thoughts of Suicide:  No Will you contract for safety?   NA  Current AVH:  No  Plan for Discharge/Comments:  No slurring of words today.  Sharper cognitively and easier to understand.  Still loquacious [his word] and requires redirection, but much better at self arresting.  Transportation Means: "I'll have my driver pick me up"  Supports: sister  Ida Rogueorth, Jodean Valade B

## 2015-04-28 NOTE — Progress Notes (Addendum)
[facility] Post 1:1 Observation Documentation  For the first (8) hours following discontinuation of 1:1 precautions, a progress note entry by nursing staff should be documented at least every 2 hours, reflecting the patient's behavior, condition, mood, and conversation.  Use the progress notes for additional entries.  Time 1:1 discontinued:  1030  Time of note: 1230  Patient's Behavior:  Patient at lunch. Verbally aggressive toward male peer without provocation.  Patient's Condition:  Remains labile, unpredictable.   Patient's Conversation:  Complaining of agitation, mania. Argumentative at times and othe times, flirtatious.  Stephen Delacruz, Stephen Delacruz [facility] 04/28/2015, 1230

## 2015-04-28 NOTE — Progress Notes (Signed)
[facility] Post 1:1 Observation Documentation  For the first (8) hours following discontinuation of 1:1 precautions, a progress note entry by nursing staff should be documented at least every 2 hours, reflecting the patient's behavior, condition, mood, and conversation.  Use the progress notes for additional entries.  Time 1:1 discontinued:  1030  Time of note: 1830  Patient's Behavior:  Remains intrusive but redirectable.  Patient's Condition:  Stable, mostly cooperative. 1800 seroquel held due to patient's slurred speech earlier this afternoon.  Patient's Conversation:  Patient hyperverbal talking about famous family members. Cursing at times but is not verbally aggressive.  Stephen Delacruz, [name] 04/28/2015, 6:26 PM

## 2015-04-28 NOTE — Progress Notes (Signed)
Order received to d/c 1:1 obs as patient is calmer and behavior more appropriate this morning. Will continue to monitor closely. Patient currently in dayroom talking with peer. Lawrence MarseillesFriedman, Priyansh Pry Eakes

## 2015-04-28 NOTE — Progress Notes (Signed)
[facility] Post 1:1 Observation Documentation  For the first (8) hours following discontinuation of 1:1 precautions, a progress note entry by nursing staff should be documented at least every 2 hours, reflecting the patient's behavior, condition, mood, and conversation.  Use the progress notes for additional entries.  Time 1:1 discontinued:  1030  Time of note: 1630  Patient's Behavior:  Remains hyperverbal. Displays mild confusion. Intrusive at times.  Patient's Condition:  Stable.   Patient's Conversation:  Socializing with peer in hallway.  Lawrence MarseillesFriedman, Katanya Schlie Eakes 04/28/2015, 4:24 PM

## 2015-04-29 MED ORDER — LORAZEPAM 2 MG/ML IJ SOLN
2.0000 mg | Freq: Once | INTRAMUSCULAR | Status: AC
  Administered 2015-04-29: 2 mg via INTRAMUSCULAR
  Filled 2015-04-29: qty 1

## 2015-04-29 MED ORDER — GABAPENTIN 100 MG PO CAPS
100.0000 mg | ORAL_CAPSULE | ORAL | Status: DC
  Administered 2015-04-29 – 2015-05-03 (×12): 100 mg via ORAL
  Filled 2015-04-29 (×15): qty 1

## 2015-04-29 MED ORDER — OLANZAPINE 10 MG PO TBDP
10.0000 mg | ORAL_TABLET | Freq: Three times a day (TID) | ORAL | Status: DC | PRN
  Administered 2015-04-29 – 2015-05-02 (×4): 10 mg via ORAL
  Filled 2015-04-29 (×4): qty 1

## 2015-04-29 MED ORDER — OXCARBAZEPINE 300 MG PO TABS
300.0000 mg | ORAL_TABLET | Freq: Two times a day (BID) | ORAL | Status: DC
  Administered 2015-04-29 – 2015-05-03 (×8): 300 mg via ORAL
  Filled 2015-04-29 (×10): qty 1

## 2015-04-29 MED ORDER — QUETIAPINE FUMARATE 25 MG PO TABS
25.0000 mg | ORAL_TABLET | Freq: Two times a day (BID) | ORAL | Status: DC
  Administered 2015-04-29 – 2015-05-03 (×8): 25 mg via ORAL
  Filled 2015-04-29 (×10): qty 1

## 2015-04-29 MED ORDER — QUETIAPINE FUMARATE 300 MG PO TABS
300.0000 mg | ORAL_TABLET | Freq: Every day | ORAL | Status: DC
  Administered 2015-04-29: 300 mg via ORAL
  Filled 2015-04-29 (×2): qty 1

## 2015-04-29 MED ORDER — OLANZAPINE 10 MG IM SOLR: 10.0000 mg | Freq: Three times a day (TID) | INTRAMUSCULAR | Status: DC | PRN

## 2015-04-29 MED ORDER — HALOPERIDOL LACTATE 5 MG/ML IJ SOLN
5.0000 mg | Freq: Once | INTRAMUSCULAR | Status: AC
  Administered 2015-04-29: 5 mg via INTRAMUSCULAR
  Filled 2015-04-29 (×2): qty 1

## 2015-04-29 NOTE — Progress Notes (Signed)
Recreation Therapy Notes  05.05.2016 @ approximately 2:30pm LRT attempted to work with patient on stress management in an effort to get patient to calm enough to sleep, as he has not sleep in more than 24 hours. Patient unwilling to participate in 1:1, however was very interested in conversing with LRT. Conversation included multiple compliments directed at LRT and patient singing to LRT. Patient additionally performed a short silly dance to indicate his happiness with not sleeping in over 24 hours.   MD informed patient refused to work with LRT.   Marykay Lexenise L Shanice Poznanski, LRT/CTRS  Jearl KlinefelterBlanchfield, Json Koelzer L 04/29/2015 3:42 PM

## 2015-04-29 NOTE — Progress Notes (Signed)
Patient remains hyperverbal, intrusive and is agitating to peers. Patient continuously talking, recounting grandiose stories. Confrontational at times when his requests/demands are not immediately met. Flirtatious and inappropriate. When asked what helps him when his mood is elevated like this patient states, 'heroin or cocaine. Got any of that?" Patient displays confusion as he asks this Probation officer for his AM meds several times after administration. He is oriented x 4. Medicated per orders, redirected frequently. Staff continues to attempt to manage milieu as peers are becoming upset by patient. He denies SI/HI/AVH and is safe at this time. Jamie Kato

## 2015-04-29 NOTE — [facility] Group Notes (Signed)
[facility] Group Notes:  (Nursing/MHT/Case Management/Adjunct)  Date:  04/29/2015  Time:  0930  Type of Therapy:  Nurse Education  Participation Level:  Did Not Attend  Participation Quality:    Affect:      Cognitive:     Insight:    Engagement in Group:    Modes of Intervention:    Summary of Progress/Problems: Patient was meeting with MD and was unable to attend.   Merian CapronFriedman, Brittain Smithey [facility]akes 04/29/2015, 0930

## 2015-04-29 NOTE — Progress Notes (Signed)
Pt intimidating, cursing, name calling, and threatening another patient in the dayroom.  Pt reports being talked to in a nasty way and disrespected by peer patient. Peer patient voiced her disliking of his behavior but was not disrespectful. Pt was removed from dayroom and deescalated by nurse in his room.
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
## 2015-04-29 NOTE — Tx Team (Signed)
Interdisciplinary Treatment Plan Update (Adult)  Date:  04/29/2015   Time Reviewed:  6:15 PM   Progress in Treatment: Attending groups: Yes. Participating in groups:  Yes. Taking medication as prescribed:  Yes. Tolerating medication:  Yes. Family/Significant othe contact made:   Patient understands diagnosis:  Yes  Discussing patient identified problems/goals with staff:  Yes, see initial care plan. Medical problems stabilized or resolved:  Yes. Denies suicidal/homicidal ideation: Yes. Issues/concerns per patient self-inventory:  No. Other:  New problem(s) identified:  Discharge Plan or Barriers:  Return home, follow up outpt  Reason for Continuation of Hospitalization: Mania Medication stabilization Other; describe Lack of sleep  Comments:  Due to poor sleep and loud, intrusive behavior at night, and continued hyperverbal pattern, Seroquel is increased today and Neurontin added.  Pt has agreed to stay through weekend with continued med adjustments  Estimated length of stay: 4-5 days  New goal(s):  Review of initial/current patient goals per problem list:     Attendees: Patient:  04/29/2015 6:15 PM   Family:   04/29/2015 6:15 PM   Physician:  Jomarie LongsSaramma Eappen, MD 04/29/2015 6:15 PM   Nursing:   Omelia BlackwaterNoelle Ardley, RN 04/29/2015 6:15 PM   CSW:    Daryel Geraldodney Tu Bayle, LCSW   04/29/2015 6:15 PM   Other:  04/29/2015 6:15 PM   Other:   04/29/2015 6:15 PM   Other:  Onnie BoerJennifer Clark, Nurse CM 04/29/2015 6:15 PM   Other:  Leisa LenzValerie Enoch, Vesta MixerMonarch TCT 04/29/2015 6:15 PM   Other:  Tomasita Morrowelora Sutton, P4CC  04/29/2015 6:15 PM   Other:  04/29/2015 6:15 PM   Other:  04/29/2015 6:15 PM   Other:  04/29/2015 6:15 PM   Other:  04/29/2015 6:15 PM   Other:  04/29/2015 6:15 PM   Other:   04/29/2015 6:15 PM    Scribe for Treatment Team:   Ida RogueNorth, Kenadie Royce B, 04/29/2015 6:15 PM

## 2015-04-29 NOTE — Care Management Utilization Note (Signed)
   Per State Regulation 482.30  This chart was reviewed for necessity with respect to the patient's Admission/ Duration of stay.  Next review date: 05/03/15  Averie Meiner Morrison RN, BSN 

## 2015-04-29 NOTE — Progress Notes (Signed)
.  [facility] Group Notes:  (Nursing/MHT/Case Management/Adjunct)  Date:  04/29/2015  Time:  2045  Type of Therapy:  wrap up group  Participation Level:  Active  Participation Quality:  Intrusive, Monopolizing, Redirectable, Sharing and Supportive  Affect:  Labile  Cognitive:  Disorganized  Insight:  None  Engagement in Group:  Distracting and Monopolizing  Modes of Intervention:  Clarification, Education and Support  Summary of Progress/Problems:  Pt reported having a problem earlier in the day with another patient but the problem had been "aleviated".  Pt talks out of turn and has trouble remaining on topic. Pt goes from one topic to another with pressured speech. Pt will transition from friendly and supportive to negative and angry very quickly.   Shelah LewandowskySquires, Rune Mendez Carol 04/29/2015, 9:26 PM

## 2015-04-29 NOTE — Plan of Care (Signed)
Problem: Alteration in mood & ability to function due to Goal: STG-Patient will comply with prescribed medication regimen (Patient will comply with prescribed medication regimen)  Outcome: Progressing Patient is med compliant and can be med seeking at times.  Problem: Aggression Towards others,Towards Self, and or Destruction Goal: STG- Patient will take staff initiated or self initiated (Patient will take staff initiated or self initiated time out)  Outcome: Not Progressing Patient unable or unwilling to self regulate his mood, behavior. Difficult to redirect when violating boundaries, agitated.
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
## 2015-04-29 NOTE — Progress Notes (Signed)
Recreation Therapy Notes  05.04.2016 @ approximately 2:40pm. LRT met with patient in an effort to educate patient on stress management techniques. Patient denied need for or desire to learn stress management, describing it as trivial and demonstrating how easy it is to practice. LRT attempted to get patient to draw during 1:1, patient declined LRT attempts. Patient did however appear to very much enjoy personal attention from LRT as he displayed flirtatious behavior and touted his accomplishments, specifically that he has multiple PhD's. 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 Additionally patient stated he has an MD with a specialty in psychiatry. Patient spoke at length about the relationship he has with is siblings, stating he is closer to his brother than his sister. Patient additionally is concerned about his cat, stating he is not sure if his sister is going by to check on him because [place] neighborhood. Patient asked LRT to contact OPT therapist, Marlow Baars with [facility] in [facility]. LRT advised patient that would be contact his LCSW would make and she would make sure to let his LCSW know that he is interested in talking to him. Patient agreeable.   Patient agreeable to continue working with LRT, however provides LRT little information as to what he wants to work towards during inpatient admission.   Patient closed 1:1 session by singing to LRT and telling her "I love you."   Lane Hacker, LRT/CTRS  Koua Deeg L 04/29/2015 8:08 AM

## 2015-04-30 DIAGNOSIS — I10 Essential (primary) hypertension: Secondary | ICD-10-CM | POA: Diagnosis present

## 2015-04-30 MED ORDER — AMLODIPINE BESYLATE 10 MG PO TABS
10.0000 mg | ORAL_TABLET | Freq: Every day | ORAL | Status: DC
  Administered 2015-04-30 – 2015-05-03 (×4): 10 mg via ORAL
  Filled 2015-04-30: qty 2
  Filled 2015-04-30 (×5): qty 1

## 2015-04-30 MED ORDER — QUETIAPINE FUMARATE 400 MG PO TABS
400.0000 mg | ORAL_TABLET | Freq: Every day | ORAL | Status: DC
  Administered 2015-04-30 – 2015-05-02 (×2): 400 mg via ORAL
  Filled 2015-04-30 (×4): qty 1

## 2015-04-30 MED ORDER — CLONIDINE HCL 0.1 MG PO TABS
0.1000 mg | ORAL_TABLET | Freq: Once | ORAL | Status: AC
  Administered 2015-04-30: 0.1 mg via ORAL
  Filled 2015-04-30 (×2): qty 1

## 2015-04-30 NOTE — Progress Notes (Signed)
Patient ID: Stephen Delacruz, male   DOB: 09-Dec-1956, 59 y.o.   MRN: [id number] D: Patient stated he is doing well at least "i'm not dead". Pt is intrusive and has be constantly redirected. Pt reports he is here against his will and he is losing money from not able to work. Pt reports having anger issues but has been able to control self so far. Pt reports plans to discharge on Monday so he trying to do everything right so he does stay here longer. Pt attended evening wrap up group and engaged in discussion.   A: Met with pt 1:1. Medications administered as prescribed. Writer encouraged pt to discuss feelings. Pt encouraged to come to staff with any questions or concerns.   R: Patient is safe on the unit. He is complaint with medications and denies any adverse reaction. Pt denies SI/HI/AVH and pain.

## 2015-04-30 NOTE — [facility] Group Notes (Signed)
[facility] LCSW Group Therapy  04/30/2015  1:05 PM  Type of Therapy:  Group therapy  Participation Level:  Active  Participation Quality:  Attentive  Affect:  Flat  Cognitive:  Oriented  Insight:  Limited  Engagement in Therapy:  Limited  Modes of Intervention:  Discussion, Socialization  Summary of Progress/Problems:  Chaplain was here to lead a group on themes of hope and courage.  "You have to have a hope and belief in something greater than yourself, but you also have to believe in yourself.  I'm hard headed, and I used to think I knew it all, but now I know I knew nothing, and I am starting over again.  I was raging, and God saved me from myself."  As usual, [name] was self absorbed and talked about the same themes and stories ad nauseum as he has in all other groups.  [name], [name] 04/30/2015 1:33 PM

## 2015-04-30 NOTE — Progress Notes (Signed)
D: Patient is A&Ox4, denies SI/HI and A/V hallucinations. Patient is pleasant but inappropriate and intrusive at times. Once staff tries to redirect patient, patient becomes irritable and agitated quickly. Patient is redirectable at this point, but is trying to split staff. Patient is also blaming another patient in the milieu as well as a staff member for antagonizing him when in reality they are setting boundaries. Patient states that he did get some sleep last night.  A: Patient received his medications as scheduled and prn. Continue q15 minute checks. R: Patient remains safe. Patient states he will talk to staff if he feels he can no longer contract for safety or if he feels like he may act out against another patient or staff.  Mahonri Seiden, Wyman SongsterAngela Marie, RN

## 2015-04-30 NOTE — Progress Notes (Signed)
Recreation Therapy Notes  05.06.2016 LRT met with patient to attempt to build therapeutic relationship and engage patient in stress management techniques, used as means of calming patient enough to facilitate sleep. Patient continues to deny need to recreation therapy interventions and continues to display sole desire to converse with and flirt with LRT.  Laureen Ochs Soua Caltagirone, LRT/CTRS  Lane Hacker 04/30/2015 2:33 PM

## 2015-04-30 NOTE — Progress Notes (Signed)
[facility] Group Notes:  (Nursing/MHT/Case Management/Adjunct)  Date:  04/30/2015  Time:  8:36 PM  Type of Therapy:  Psychoeducational Skills  Participation Level:  Active  Participation Quality:  Intrusive and Monopolizing  Affect:  Anxious and Excited  Cognitive:  Disorganized  Insight:  Improving  Engagement in Group:  Distracting and Monopolizing  Modes of Intervention:  Education  Summary of Progress/Problems: The patient required redirection for talking out of turn. He mentioned that he was proud of the fact that he was not "kicked out of any groups today". The patient indicated that he enjoyed the group led by the hospital's chaplain but was unhappy about not spending enough time outdoors. In addition, he verbalized that he had been overly medicated earlier in the week to the point where he urinated on himself.  He also mentioned that he has a history of not sleeping for long periods of time, but denies having trouble sleeping at night. As a theme for the day, his relapse prevention will include calling 911 when people are about to get murdered. He sees no problem with calling the police and states that he does not understand why he was admitted to the hospital.   Hazle CocaGOODMAN, Anny Sayler S 04/30/2015, 8:36 PM
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
## 2015-05-01 NOTE — Progress Notes (Signed)
D: Pt rates his depression, anxiety, and hopelessness a 0. He states he is looking forward to discharge. Pt remains loud and intrusive. Has to be redirected occasionally.  A: Support given. Verbalization encouraged. Pt encouraged to come to staff for any concerns. Medications given as prescribed. R: Pt is receptive. No complaints of pain or discomfort at this time. Q15 min safety checks maintained. Pt remains safe on the unit. Will continue to monitor.

## 2015-05-01 NOTE — Progress Notes (Signed)
Pt in 500 (Reathy) has been loud and cursing about everything on the unit. Reathy has been cursing and yelling at pt and causing his anxiety to go up. Reathy called pt mother "a dead Whore". This irritated and got pt upset and pt had to be placed in quiet room and given PRN Zyprexa. Pt is upset due to unresolved issues on the unit. This Pt (Reathy) has disrupted the milieu due to her Loud , agitated, demanding attitude and continues to argue with everyone.

## 2015-05-01 NOTE — [facility] Group Notes (Signed)
[facility] Group Notes:  (Nursing/MHT/Case Management/Adjunct)  Date:  05/01/2015  Time:  2:51 PM  Type of Therapy:  Psychoeducational Skills  Participation Level:  Active  Participation Quality:  Intrusive  Affect:  Appropriate  Cognitive:  Alert and Appropriate  Insight:  Improving  Engagement in Group:  Engaged  Modes of Intervention:  Discussion, Education and Support  Summary of Progress/Problems: Pt was engaged in group although intrusive at times.   Leda QuailSmith, Joslynne Klatt T 05/01/2015, 2:51 PM

## 2015-05-01 NOTE — [facility] Group Notes (Signed)
[facility] Group Notes:  (Clinical Social Work)  05/01/2015  11:15-12:00PM  Summary of Progress/Problems:   The main focus of today's process group was to discuss patients' feelings related to being hospitalized, as well as the difference between "being" and "having" a mental health diagnosis.  It was agreed in general by the group that it would be preferable to avoid future hospitalizations, and we discussed means of doing that.  As a follow-up, problems with adhering to medication recommendations were discussed.  The patient expressed their primary feeling about being hospitalized is "mixed, because I was trying to prevent a mass murder." The patient states, "there is a good and bad part in the feeling, the bad is I missed my house payment and the good is the hospital has the best staff in the world."  Type of Therapy:  Group Therapy - Process  Participation Level:  Active  Participation Quality:  Attentive and Redirectable  Affect:  Appropriate  Cognitive:  Alert  Insight:  Developing/Improving  Engagement in Therapy:  Developing/Improving  Modes of Intervention:  Exploration, Discussion  Brieonna Crutcher Patrick-[place]erson, LCSWA 05/01/2015, 12:51 PM

## 2015-05-01 NOTE — Plan of Care (Signed)
Problem: Diagnosis: Increased Risk For Suicide Attempt Goal: STG-Patient Will Comply With Medication Regime Outcome: Progressing Pt denies suicidal ideation. Pt has been compliant with scheduled medication regime.

## 2015-05-01 NOTE — Plan of Care (Signed)
Problem: Aggression Towards others,Towards Self, and or Destruction Goal: LTG - No aggression,physical/verbal/destruction prior to D/C (Patient will have no episodes of physical or verbal aggression or property destruction towards self or others for _____ day (s) prior to discharge.)  Outcome: Progressing Pt appropriate to peers on the unit this evening  Problem: Diagnosis: Increased Risk For Suicide Attempt Goal: LTG-Patient Will Show Positive Response to Medication LTG (by discharge) : Patient will show positive response to medication and will participate in the development of the discharge plan.  Outcome: Progressing Pt appears to be  Goal: LTG-Patient Will Report Improvement in Psychotic Symptoms LTG (by discharge) : Patient will report improvement in psychotic symptoms.  Outcome: Progressing Pt denies SI at this time
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
## 2015-05-01 NOTE — Progress Notes (Signed)
D: Pt denies SI/HI/AVH. Pt is  cooperative. Pt continues to be intrusive, hyper-verbal, but pt is appropriate on the unit. Pt continues to be focused on whatever is on his mind, pt has no insight to Tx and appears closed minded when it comes to new things.   A: Pt was offered support and encouragement. Pt was given scheduled medications. Pt was encourage to attend groups. Q 15 minute checks were done for safety. Tried to talk about non-pharmacological .   R:Pt attends groups and interacts well with peers and staff. Pt is taking medication. Pt has no complaints at this time .Pt receptive to treatment and safety maintained on unit. Pt not receptive to Non-pharmacological pain measures.
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
## 2015-05-02 NOTE — Progress Notes (Signed)
[facility] Group Notes:  (Nursing/MHT/Case Management/Adjunct)  Date:  05/02/2015  Time:  9:05 PM  Type of Therapy:  Psychoeducational Skills  Participation Level:  Active  Participation Quality:  Intrusive and Monopolizing  Affect:  Excited  Cognitive:  Appropriate  Insight:  Appropriate  Engagement in Group:  Distracting and Monopolizing  Modes of Intervention:  Education  Summary of Progress/Problems: The patient had to be redirected a number of times for speaking out of order and interrupting his peers. On at least two occasions, one of his peers spoke up and informed the patient that he was interfering with his train of thought. The patient stated that he was able to socialize with his peers and go outside for fresh air. As a theme for the day, his support system will consist of his four doctors.   Meryl Ponder S 05/02/2015, 9:05 PM

## 2015-05-02 NOTE — Progress Notes (Signed)
D: Pt denies SI/HI/AVH. Pt is pleasant and cooperative. Pt continues to be manic, hyper verbal, but is appropriate on the unit. Pt constantly talking and telling stories about what he is doing and what he will be doing when he leaves. Pt continues to try to one up the person he is talking to, regardless what the story is about. Pt appears to be delusional about some things and has delusions of Grandeur about others. Pt was in the dayroom and provoked other people on the hall.  A: Pt was offered support and encouragement. Pt was given scheduled medications. Pt was encourage to attend groups. Q 15 minute checks were done for safety.   R:Pt attends groups and interacts well with peers and staff. Pt is taking medication. Pt has no complaints at this time .Pt receptive to treatment and safety maintained on unit.

## 2015-05-02 NOTE — [facility] Group Notes (Signed)
[facility] Group Notes:  (Clinical Social Work)  05/02/2015  [facility] Group Notes:  (Clinical Social Work)  05/02/2015  11:00AM-12:00PM  Summary of Progress/Problems:  The main focus of today's process group was to listen to a variety of genres of music and to identify that different types of music provoke different responses.  The patient then was able to identify personally what was soothing for them, as well as energizing.  Handouts were used to record feelings evoked, as well as how patient can personally use this knowledge in sleep habits, with depression, and with other symptoms.  The patient expressed understanding of concepts, as well as knowledge of how each type of music affected him/her and how this can be used at home as a wellness/recovery tool.  He remained hyperverbal and intrusive, but was at times apologetic about it.  Type of Therapy:  Music Therapy   Participation Level:  Active  Participation Quality:  Attentive and Sharing, somewhat Intrusive  Affect:  Blunted  Cognitive:  Disorganized  Insight:  Engaged  Engagement in Therapy:  Engaged  Modes of Intervention:   Activity, Exploration  Ambrose MantleMareida Grossman-Orr, LCSW 05/02/2015

## 2015-05-02 NOTE — [facility] Group Notes (Addendum)
[facility] Group Notes:  (Nursing/MHT/Case Management/Adjunct)  Date:  05/02/2015  Time:  0930  Type of Therapy:  Nurse Education  Participation Level:  Active  Participation Quality:  Intrusive and Sharing  Affect:  Anxious and Appropriate  Cognitive:  Appropriate  Insight:  Good  Engagement in Group:  Engaged and Monopolizing  Modes of Intervention:  Discussion  Summary of Progress/Problems:  Bennye Almrdley, Fallynn Gravett Violon 05/02/2015, 1:35 PM

## 2015-05-02 NOTE — Progress Notes (Signed)
The patient has been in a foul mood over the past hour and has been redirected on multiple occasions for taking out his frustration on his peers. To begin with, the patient was upset since the television was changed to another station. The patient made multiple comments over and over again and disrupted the television show. Patient was redirected. Secondly, the patient was confronted over his inappropriate language. At one point, the patient suggested to this author that he calm down and take his Seroquel. Patient was redirected. While this was taking place, one of his male peers left the dayroom and had to speak with his nurse due to all of the disruptions. Next, the patient attempted to start an argument  With the male peer that he had difficulty with yesterday. The patient eventually used profanity and then accused her of insulting and cursing at his dead mother, when in fact, she said nothing to him. The patient left the dayroom where he spoke to this author in his bedroom and at no time took responsibility for his actions. In addition, a second male peer became upset over the noise and requested to go sleep in the quiet room. The nurse has been notified of these events.

## 2015-05-02 NOTE — Progress Notes (Signed)
D: Pt's mood is pleasant. He denies SI/HI/AVH. Continues to be loud, intrusive, and hyperverbal. Cooperative. A: Support given. Verbalization encouraged. Pt encouraged to come to staff for any concerns. Medications given as prescribed. R: Pt is receptive. No complaints of pain or discomfort at this time. Q15 min safety checks maintained. Pt remains safe on the unit. Will continue to monitor.

## 2015-05-03 ENCOUNTER — Other Ambulatory Visit (HOSPITAL_COMMUNITY): Payer: Medicare Other | Admitting: Psychiatry

## 2015-05-03 DIAGNOSIS — I10 Essential (primary) hypertension: Secondary | ICD-10-CM | POA: Insufficient documentation

## 2015-05-03 LAB — URINE CYTOLOGY ANCILLARY ONLY
Chlamydia: NEGATIVE
NEISSERIA GONORRHEA: NEGATIVE

## 2015-05-03 LAB — HIV ANTIBODY (ROUTINE TESTING W REFLEX): HIV Screen 4th Generation wRfx: NONREACTIVE

## 2015-05-03 MED ORDER — QUETIAPINE FUMARATE 25 MG PO TABS: 25.0000 mg | ORAL_TABLET | Freq: Two times a day (BID) | ORAL | Status: DC

## 2015-05-03 MED ORDER — GABAPENTIN 100 MG PO CAPS: 100.0000 mg | ORAL_CAPSULE | ORAL | Status: DC

## 2015-05-03 MED ORDER — OXCARBAZEPINE 300 MG PO TABS: 300.0000 mg | ORAL_TABLET | Freq: Two times a day (BID) | ORAL | Status: DC

## 2015-05-03 MED ORDER — QUETIAPINE FUMARATE 400 MG PO TABS: 400.0000 mg | ORAL_TABLET | Freq: Every day | ORAL | Status: DC

## 2015-05-03 MED ORDER — ADULT MULTIVITAMIN W/MINERALS CH: 1.0000 | ORAL_TABLET | Freq: Every day | ORAL | Status: DC

## 2015-05-03 MED ORDER — FOLIC ACID 1 MG PO TABS: 1.0000 mg | ORAL_TABLET | Freq: Every day | ORAL | Status: DC

## 2015-05-03 MED ORDER — THIAMINE HCL 100 MG PO TABS: 100.0000 mg | ORAL_TABLET | Freq: Every day | ORAL | Status: DC

## 2015-05-03 MED ORDER — AMLODIPINE BESYLATE 10 MG PO TABS: 10.0000 mg | ORAL_TABLET | Freq: Every day | ORAL | Status: DC

## 2015-05-03 NOTE — Progress Notes (Signed)
Patient ID: Stephen Delacruz, male   DOB: 02/01/1956, 59 y.o.   MRN: [id number]  Lab called and needed GC lab order reordered. Lab already has specimen, but order was deleted. Per Eappen order was again placed.

## 2015-05-03 NOTE — [facility] Suicide Risk Assessment (Signed)
[facility]   Demographic Factors:  Male and Caucasian  Total Time spent with patient: 30 minutes  Musculoskeletal: Strength & Muscle Tone: within normal limits Gait & Station: normal Patient leans: N/A  Psychiatric Specialty Exam: Physical Exam  ROS  Blood pressure 133/95, pulse 108, temperature 97.8 F (36.6 C), temperature source Oral, resp. rate 17, height 5\' 5"  (1.651 m), weight 86.183 kg (190 lb), SpO2 98 %.Body mass index is 31.62 kg/(m^2).  General Appearance: Casual  Eye Contact::  Good  Speech:  Clear and Coherent409  Volume:  Normal  Mood:  Euthymic  Affect:  Congruent  Thought Process:  Coherent  Orientation:  Full (Time, Place, and Person)  Thought Content:  WDL  Suicidal Thoughts:  No  Homicidal Thoughts:  No  Memory:  Immediate;   Fair Recent;   Fair Remote;   Fair  Judgement:  Fair  Insight:  Shallow  Psychomotor Activity:  Normal  Concentration:  Fair  Recall:  FiservFair  Fund of Knowledge:Fair  Language: Fair  Akathisia:  No  Handed:  Right  AIMS (if indicated):     Assets:  Communication Skills Physical Health  Sleep:  Number of Hours: 6.75  Cognition: WNL  ADL's:  Intact   Have you used any form of tobacco in the last 30 days? (Cigarettes, Smokeless Tobacco, Cigars, and/or Pipes): Yes  Has this patient used any form of tobacco in the last 30 days? (Cigarettes, Smokeless Tobacco, Cigars, and/or Pipes)yes- provide nicotine gum.  Mental Status Per Nursing Assessment::   On Admission:  NA  Current Mental Status by Physician: PATIENT DENIES SI/HI/AH/VH  Loss Factors: NA  Historical Factors: Impulsivity  Risk Reduction Factors:   Positive therapeutic relationship  Continued Clinical Symptoms:  Alcohol/Substance Abuse/Dependencies Previous Psychiatric Diagnoses and Treatments  Cognitive Features That Contribute To Risk:  Polarized thinking    Suicide Risk:  Minimal: No identifiable suicidal ideation.  Patients  presenting with no risk factors but with morbid ruminations; may be classified as minimal risk based on the severity of the depressive symptoms  Principal Problem: Bipolar disorder, curr episode mixed, severe, with psychotic features Discharge Diagnoses:  Patient Active Problem List   Diagnosis Date Noted  . HTN (hypertension) [I10] 04/30/2015  . Bipolar disorder, curr episode mixed, severe, with psychotic features [F31.64] 04/22/2015  . Alcohol use disorder, severe, dependence [F10.20] 04/22/2015  . Cannabis use disorder, moderate, dependence [F12.20] 04/22/2015  . Cocaine use disorder, moderate, dependence [F14.20] 04/22/2015    Follow-up Information    Follow up with Dr Milagros Evenerupinder Kaur On 05/03/2015.   Why:  Hospital discharge follow up appt at 4 PM on 05/03/15   Contact information:   [address],  [place], [place] [address] [phone] Fax:  [phone]       Follow up with Ollen GrossEllen Wilson, PhD On 05/10/2015.   Why:  Therapy appt scheduled for 05/10/15 at 2 PM.Provider requires patient to call to confirm appointment.   Contact information:   [address],  [place], [place] [address] Phone:  [phone] Fax:  [phone]      Plan Of Care/Follow-up recommendations:  Activity:  NO RESTRICTIONS Diet:  REGULAR Tests:  AS NEEDED Other:  FOLLOW UP WITH PMD FOR HTN- patient wants to find his own PMD - Or would ask Dr.Kaur to refer him to one - does not want to be referred from here. Has medicare.  Is patient on multiple antipsychotic therapies at discharge:  No   Has Patient had  three or more failed trials of antipsychotic monotherapy by history:  No  Recommended Plan for Multiple Antipsychotic Therapies: NA    Solene Hereford md 05/03/2015, 10:36 AM

## 2015-05-03 NOTE — Progress Notes (Signed)
Patient ID: Stephen Delacruz, male   DOB: 07/12/1956, 59 y.o.   MRN: [id number]  Pt. Denies SI/HI and A/V hallucinations. Belongings returned to patient at time of discharge. Patient denies any new onset of pain or discomfort. Discharge instructions and medications were reviewed with patient. Patient verbalized understanding of both medications and discharge instructions. Patient retrieved belongings from locker. Patient reported he had a necklace in his locker however it was not found and it was not on his person. Patient started to get upset. Checked in safe but it was not in there. A/C Tomi LikensEric K. Spoke with patient and patient stated, "I think the cop took it off me and put it in the house." Patient signed that [facility]. Patient left in no distress. Q15 minute safety checks maintained until discharge.

## 2015-05-03 NOTE — Progress Notes (Signed)
  [facility] :  Will you be returning to the same living situation after discharge:  Yes,  home At discharge, do you have transportation home?: Yes,  "My driver", or a bus pass Do you have the ability to pay for your medications: Yes,  MCR  Release of information consent forms completed and in the chart;  Patient's signature needed at discharge.  Patient to Follow up at: Follow-up Information    Follow up with Dr Milagros Evenerupinder Kaur On 05/03/2015.   Why:  Hospital discharge follow up appt at 4 PM on 05/03/15   Contact information:   [address],  [place], [place] [address] [phone] Fax:  [phone]       Follow up with Ollen GrossEllen Wilson, PhD On 05/10/2015.   Why:  Therapy appt scheduled for 05/10/15 at 2 PM.Provider requires patient to call to confirm appointment.   Contact information:   [address],  [place], [place] [address] Phone:  [phone] Fax:  [phone]      Patient denies SI/HI: Yes,  yes    Safety Planning and Suicide Prevention discussed: Yes,  yes  Have you used any form of tobacco in the last 30 days? (Cigarettes, Smokeless Tobacco, Cigars, and/or Pipes): Yes  Has patient been referred to the Quitline?: Yes, faxed on 05/03/15    Ida Rogueorth, Cruzito Standre B 05/03/2015, 10:57 AM
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
## 2015-05-03 NOTE — Tx Team (Signed)
Interdisciplinary Treatment Plan Update (Adult)  Date:  05/03/2015   Time Reviewed:  8:59 AM   Progress in Treatment: Attending groups: Yes. Participating in groups:  Yes. Taking medication as prescribed:  Yes. Tolerating medication:  Yes. Family/Significant othe contact made:   Patient understands diagnosis:  Yes   Discussing patient identified problems/goals with staff:  Yes, see initial care plan. Medical problems stabilized or resolved:  Yes. Denies suicidal/homicidal ideation: Yes. Issues/concerns per patient self-inventory:  No. Other:  New problem(s) identified:  Discharge Plan or Barriers:  Return home, follow up outpt  Reason for Continuation of Hospitalization:   Comments:  Estimated length of stay:  D/C today  New goal(s):  Review of initial/current patient goals per problem list:     Attendees: Patient:  05/03/2015 8:59 AM   Family:   05/03/2015 8:59 AM   Physician:  Jomarie LongsSaramma Eappen, MD 05/03/2015 8:59 AM   Nursing:   Marzetta Boardhrista Dopson, RN 05/03/2015 8:59 AM   CSW:    Daryel Geraldodney Jacora Hopkins, LCSW   05/03/2015 8:59 AM   Other:  05/03/2015 8:59 AM   Other:   05/03/2015 8:59 AM   Other:  Onnie BoerJennifer Clark, Nurse CM 05/03/2015 8:59 AM   Other:  Leisa LenzValerie Enoch, [facility] 05/03/2015 8:59 AM   Other:  Tomasita Morrowelora Sutton, P4CC  05/03/2015 8:59 AM   Other:  05/03/2015 8:59 AM   Other:  05/03/2015 8:59 AM   Other:  05/03/2015 8:59 AM   Other:  05/03/2015 8:59 AM   Other:  05/03/2015 8:59 AM   Other:   05/03/2015 8:59 AM    Scribe for Treatment Team:   Ida RogueNorth, Doha Boling B, 05/03/2015 8:59 AM

## 2015-05-09 ENCOUNTER — Emergency Department (HOSPITAL_COMMUNITY)
Admission: EM | Admit: 2015-05-09 | Discharge: 2015-05-09 | Disposition: A | Discharge status: Home / Self Care | Payer: Medicare Other | Attending: Emergency Medicine | Admitting: Emergency Medicine

## 2015-05-09 ENCOUNTER — Encounter (HOSPITAL_COMMUNITY): Payer: Self-pay | Admitting: Emergency Medicine

## 2015-05-09 ENCOUNTER — Emergency Department (HOSPITAL_COMMUNITY): Payer: Medicare Other

## 2015-05-09 DIAGNOSIS — F1310 Sedative, hypnotic or anxiolytic abuse, uncomplicated: Secondary | ICD-10-CM | POA: Diagnosis not present

## 2015-05-09 DIAGNOSIS — Z87442 Personal history of urinary calculi: Secondary | ICD-10-CM | POA: Diagnosis not present

## 2015-05-09 DIAGNOSIS — F1210 Cannabis abuse, uncomplicated: Secondary | ICD-10-CM | POA: Insufficient documentation

## 2015-05-09 DIAGNOSIS — F1410 Cocaine abuse, uncomplicated: Secondary | ICD-10-CM | POA: Diagnosis not present

## 2015-05-09 DIAGNOSIS — Z79899 Other long term (current) drug therapy: Secondary | ICD-10-CM | POA: Diagnosis not present

## 2015-05-09 DIAGNOSIS — F1910 Other psychoactive substance abuse, uncomplicated: Secondary | ICD-10-CM

## 2015-05-09 DIAGNOSIS — F319 Bipolar disorder, unspecified: Secondary | ICD-10-CM | POA: Diagnosis not present

## 2015-05-09 DIAGNOSIS — F419 Anxiety disorder, unspecified: Secondary | ICD-10-CM | POA: Diagnosis not present

## 2015-05-09 DIAGNOSIS — D72829 Elevated white blood cell count, unspecified: Secondary | ICD-10-CM | POA: Insufficient documentation

## 2015-05-09 DIAGNOSIS — F4310 Post-traumatic stress disorder, unspecified: Secondary | ICD-10-CM | POA: Insufficient documentation

## 2015-05-09 DIAGNOSIS — F99 Mental disorder, not otherwise specified: Secondary | ICD-10-CM | POA: Insufficient documentation

## 2015-05-09 DIAGNOSIS — I10 Essential (primary) hypertension: Secondary | ICD-10-CM | POA: Diagnosis not present

## 2015-05-09 DIAGNOSIS — Z008 Encounter for other general examination: Secondary | ICD-10-CM | POA: Diagnosis present

## 2015-05-09 DIAGNOSIS — Z720 Tobacco use: Secondary | ICD-10-CM | POA: Insufficient documentation

## 2015-05-09 LAB — URINALYSIS, ROUTINE W REFLEX MICROSCOPIC
Bilirubin Urine: NEGATIVE
GLUCOSE, UA: NEGATIVE mg/dL
Hgb urine dipstick: NEGATIVE
KETONES UR: NEGATIVE mg/dL
Leukocytes, UA: NEGATIVE
NITRITE: NEGATIVE
Protein, ur: NEGATIVE mg/dL
Specific Gravity, Urine: 1.004 — ABNORMAL LOW (ref 1.005–1.030)
Urobilinogen, UA: 0.2 mg/dL (ref 0.0–1.0)
pH: 6 (ref 5.0–8.0)

## 2015-05-09 LAB — CBC
HEMATOCRIT: 47.9 % (ref 39.0–52.0)
HEMOGLOBIN: 16.4 g/dL (ref 13.0–17.0)
MCH: 33.1 pg (ref 26.0–34.0)
MCHC: 34.2 g/dL (ref 30.0–36.0)
MCV: 96.6 fL (ref 78.0–100.0)
Platelets: 414 10*3/uL — ABNORMAL HIGH (ref 150–400)
RBC: 4.96 MIL/uL (ref 4.22–5.81)
RDW: 13.1 % (ref 11.5–15.5)
WBC: 21.6 10*3/uL — AB (ref 4.0–10.5)

## 2015-05-09 LAB — COMPREHENSIVE METABOLIC PANEL
ALBUMIN: 4.6 g/dL (ref 3.5–5.0)
ALT: 35 U/L (ref 17–63)
AST: 38 U/L (ref 15–41)
Alkaline Phosphatase: 115 U/L (ref 38–126)
Anion gap: 12 (ref 5–15)
BUN: 12 mg/dL (ref 6–20)
CALCIUM: 9.1 mg/dL (ref 8.9–10.3)
CO2: 26 mmol/L (ref 22–32)
CREATININE: 1.28 mg/dL — AB (ref 0.61–1.24)
Chloride: 100 mmol/L — ABNORMAL LOW (ref 101–111)
GFR calc Af Amer: >60 mL/min (ref >60)
GFR calc non Af Amer: >60 mL/min (ref >60)
Glucose, Bld: 107 mg/dL — ABNORMAL HIGH (ref 65–99)
POTASSIUM: 3.8 mmol/L (ref 3.5–5.1)
Sodium: 138 mmol/L (ref 135–145)
Total Bilirubin: 1.1 mg/dL (ref 0.3–1.2)
Total Protein: 8 g/dL (ref 6.5–8.1)

## 2015-05-09 LAB — RAPID URINE DRUG SCREEN, HOSP PERFORMED
Amphetamines: NOT DETECTED
Barbiturates: NOT DETECTED
Benzodiazepines: POSITIVE — AB
Cocaine: POSITIVE — AB
OPIATES: NOT DETECTED
TETRAHYDROCANNABINOL: POSITIVE — AB

## 2015-05-09 LAB — ETHANOL

## 2015-05-09 LAB — ACETAMINOPHEN LEVEL: Acetaminophen (Tylenol), Serum: <10 ug/mL — ABNORMAL LOW (ref 10–30)

## 2015-05-09 LAB — SALICYLATE LEVEL: Salicylate Lvl: <4 mg/dL (ref 2.8–30.0)

## 2015-05-09 MED ORDER — ZIPRASIDONE MESYLATE 20 MG IM SOLR
20.0000 mg | Freq: Once | INTRAMUSCULAR | Status: DC
  Filled 2015-05-09: qty 20

## 2015-05-09 MED ORDER — FOLIC ACID 1 MG PO TABS
1.0000 mg | ORAL_TABLET | Freq: Every day | ORAL | Status: DC
Start: 2015-05-09 — End: 2015-05-09
  Administered 2015-05-09: 1 mg via ORAL
  Filled 2015-05-09: qty 1

## 2015-05-09 MED ORDER — DIAZEPAM 5 MG PO TABS
10.0000 mg | ORAL_TABLET | Freq: Once | ORAL | Status: AC
  Administered 2015-05-09: 10 mg via ORAL
  Filled 2015-05-09 (×2): qty 2

## 2015-05-09 MED ORDER — LORAZEPAM 1 MG PO TABS
1.0000 mg | ORAL_TABLET | Freq: Three times a day (TID) | ORAL | Status: DC | PRN
  Administered 2015-05-09: 1 mg via ORAL
  Filled 2015-05-09: qty 1

## 2015-05-09 MED ORDER — QUETIAPINE FUMARATE 25 MG PO TABS
25.0000 mg | ORAL_TABLET | Freq: Two times a day (BID) | ORAL | Status: DC
  Administered 2015-05-09: 25 mg via ORAL
  Filled 2015-05-09: qty 1

## 2015-05-09 MED ORDER — ADULT MULTIVITAMIN W/MINERALS CH
1.0000 | ORAL_TABLET | Freq: Every day | ORAL | Status: DC
  Administered 2015-05-09: 1 via ORAL
  Filled 2015-05-09: qty 1

## 2015-05-09 MED ORDER — VITAMIN B-1 100 MG PO TABS
100.0000 mg | ORAL_TABLET | Freq: Every day | ORAL | Status: DC
  Administered 2015-05-09: 100 mg via ORAL
  Filled 2015-05-09: qty 1

## 2015-05-09 MED ORDER — DIAZEPAM 5 MG PO TABS: 10.0000 mg | ORAL_TABLET | Freq: Four times a day (QID) | ORAL | Status: DC

## 2015-05-09 MED ORDER — QUETIAPINE FUMARATE 300 MG PO TABS: 400.0000 mg | ORAL_TABLET | Freq: Every day | ORAL | Status: DC

## 2015-05-09 MED ORDER — OXCARBAZEPINE 300 MG PO TABS
300.0000 mg | ORAL_TABLET | Freq: Two times a day (BID) | ORAL | Status: DC
  Filled 2015-05-09: qty 1

## 2015-05-09 MED ORDER — AMLODIPINE BESYLATE 10 MG PO TABS
10.0000 mg | ORAL_TABLET | Freq: Every day | ORAL | Status: DC
  Administered 2015-05-09: 10 mg via ORAL
  Filled 2015-05-09: qty 1

## 2015-05-09 MED ORDER — LORAZEPAM 2 MG/ML IJ SOLN
2.0000 mg | Freq: Once | INTRAMUSCULAR | Status: AC
  Administered 2015-05-09: 2 mg via INTRAMUSCULAR
  Filled 2015-05-09: qty 1

## 2015-05-09 MED ORDER — GABAPENTIN 100 MG PO CAPS: 100.0000 mg | ORAL_CAPSULE | ORAL | Status: DC

## 2015-05-09 MED ORDER — IBUPROFEN 200 MG PO TABS: 600.0000 mg | ORAL_TABLET | Freq: Three times a day (TID) | ORAL | Status: DC | PRN

## 2015-05-09 MED ORDER — ONDANSETRON HCL 4 MG PO TABS: 4.0000 mg | ORAL_TABLET | Freq: Three times a day (TID) | ORAL | Status: DC | PRN

## 2015-05-09 NOTE — ED Provider Notes (Addendum)
ED ECG REPORT   Date: 05/09/2015  Rate: 99  Rhythm: normal sinus rhythm  QRS Axis: RAD  Intervals: normal  ST/T Wave abnormalities: normal  Conduction Disutrbances:none, RAE  Narrative Interpretation:   Old EKG Reviewed: unchanged  I have personally reviewed the EKG tracing and agree with the computerized printout as noted.  Medical screening examination/treatment/procedure(s) were conducted as a shared visit with non-physician practitioner(s) and myself.  I personally evaluated the patient during the encounter.   EKG Interpretation None         Toy CookeyMegan Docherty, MD 05/09/15 1226  Toy CookeyMegan Docherty, MD 05/09/15 1226
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
## 2015-05-09 NOTE — ED Notes (Signed)
ORAL FLUIDS GIVEN 
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
## 2015-05-09 NOTE — ED Notes (Signed)
Patient transported to X-ray 

## 2015-05-09 NOTE — ED Notes (Signed)
IVC PAPER/[employer] PRESENT

## 2015-05-09 NOTE — Discharge Instructions (Signed)
°Emergency Department Resource Guide °1) Find a Doctor and Pay Out of Pocket °Although you won't have to find out who is covered by your insurance plan, it is a good idea to ask around and get recommendations. You will then need to call the office and see if the doctor you have chosen will accept you as a new patient and what types of options they offer for patients who are self-pay. Some doctors offer discounts or will set up payment plans for their patients who do not have insurance, but you will need to ask so you aren't surprised when you get to your appointment. ° °2) Contact Your Local Health Department °Not all health departments have doctors that can see patients for sick visits, but many do, so it is worth a call to see if yours does. If you don't know where your local health department is, you can check in your phone book. The CDC also has a tool to help you locate your state's health department, and many state websites also have listings of all of their local health departments. ° °3) Find a Walk-in Clinic °If your illness is not likely to be very severe or complicated, you may want to try a walk in clinic. These are popping up all over the country in pharmacies, drugstores, and shopping centers. They're usually staffed by nurse practitioners or physician assistants that have been trained to treat common illnesses and complaints. They're usually fairly quick and inexpensive. However, if you have serious medical issues or chronic medical problems, these are probably not your best option. ° °No Primary Care Doctor: °- Call [facility] at  832-8000 - they can help you locate a primary care doctor that  accepts your insurance, provides certain services, etc. °- Physician Referral Service- 1-800-533-3463 ° °Chronic Pain Problems: °Organization         Address  Phone   Notes  °[facility]  (336) 297-2271 Patients need to be referred by their primary care doctor.  ° °Medication  Assistance: °Organization         Address  Phone   Notes  °[place] [facility] [address] °[place], [place] [address] (336) 641-8030 --Must be a resident of [place] °-- Must have NO insurance coverage whatsoever (no Medicaid/ Medicare, etc.) °-- The pt. MUST have a primary care doctor that directs their care regularly and follows them in the community °  °[facility]  (866) 331-1348   °[employer]  (888) 892-1162   ° °Agencies that provide inexpensive medical care: °Organization         Address  Phone   Notes  °[facility]  (336) 832-8035   °[facility]    (336) 832-7272   °[facility] [address] °[place], [place] [address] (336) 832-4777   °[facility] [address], °[place] (336) 271-4999   °[facility]    (336) 373-0678   °[facility]    (336) 272-1050   °[facility] ° [address], [place] Phone:  (336) 832-4444, Fax:  (336) 832-4440 Hours of Operation:  9 am - 6 pm, M-F.  Also accepts Medicaid/Medicare and self-pay.  °[facility] ° [address], [place] Phone: (336) 832-3150, Fax: (336) [phone]. Hours of Operation:  8:30 am - 5:30 pm, M-F.  Also accepts Medicaid and self-pay.  °[facility] [place] [address], [place] Phone: (336) 878-6027   °[facility] [address], [place], [place] (336)723-1848, Ext. 123 Mondays & Thursdays: 7-9 AM.  First 15 patients are seen on a first come, first serve basis. °  ° °Medicaid-accepting [place] Providers: ° °Organization         Address  Phone   Notes  °[facility] [address], [place] (336) 641-2100 Also accepts self-pay patients.  °[facility] [address], [place] ° (336) 856-9996   °[facility] [address], [place]  (336) 288-8857   °[facility] [address] [place] [address], [place] (336) 299-7000   °Veita Bland [address], [place]  ° (336) 373-1557 Only accepts [place] Access Medicaid patients after they have their name applied to their card.  ° °Self-Pay (no insurance) in [place]: ° °Organization         Address  Phone   Notes  °Sickle Cell Patients, [facility] [address], [place] (336) 832-1970   °[facility] [address], [place] (336) 832-4400   °[facility] ° [address], [place] (336) 992-4800   °[facility]/Dr. Osei-Bonsu ° [address] [place] [address], [place] or [address], [place] (336) 841-8500 Phone number for both [place] and [place] locations is the same.  °[facility] [address], [place] (336) 299-0000   °[facility] [address] [place] [address], [place] or [address] (336) 852-7530 °(336) 878-2260   °[facility] [address], [place] (336) 350-1642, phone; (336) 294-5005, fax Sees patients 1st and 3rd Saturday of every month.  Must not qualify for public or private insurance (i.e. Medicaid, Medicare, [place] Health Choice, Veterans' Benefits) • Household income should be no more than 200% of the poverty level •The clinic cannot treat you if you are pregnant or think you are pregnant • Sexually transmitted diseases are not treated at the clinic.  ° ° °Dental Care: °Organization         Address  Phone  Notes  °[place] [facility] [address], [place] (336) 641-6152 Accepts children up to age 21 who are enrolled in Medicaid or [place] Health Choice; pregnant women with a Medicaid card; and children who have applied for Medicaid or [place] Health Choice, but were declined, whose parents can pay a reduced fee at time of service.  °[place]  [facility] [place]  [address], [place] (336) 641-7733 Accepts children up to age 21 who are enrolled in Medicaid or [place] Health Choice; pregnant women with a Medicaid card; and children who have applied for Medicaid or  Health Choice, but were declined, whose parents can pay a reduced fee at time of service.  °[facility] ° [address],  (336) 641-4533 Patients are seen by appointment only. Walk-ins are not accepted. [facility] will see patients 18 years of age and older. °Monday - Tuesday (8am-5pm) °Most Wednesdays (8:30-5pm) °$30 per visit, cash only  °[facility] ° [address], [place] (336) 641-4533 Patients are seen by appointment only. Walk-ins are not accepted. [facility] will see patients 18 years of age and older. °One   Wednesday Evening (Monthly: Volunteer Based).  $30 per visit, cash only  °[facility]  (919) 537-3737 for adults; Children under age 4, call Graduate Pediatric Dentistry at (919) [phone]. Children aged 4-14, please call (919) [phone] to request a pediatric application. ° Dental services are provided in all areas of dental care including fillings, crowns and bridges, complete and partial dentures, implants, gum treatment, root canals, and extractions. Preventive care is also provided. Treatment is provided to both adults and children. °Patients are selected via a lottery and there is often a waiting list. °  °[facility] [address], °[place] ° (336) 763-8833 [email or web address] °  °[facility] [address], [place], [place] (336)723-1848, Ext. 123 Second and Fourth Thursday of each month, opens at 6:30 AM; Clinic ends at 9 AM.  Patients are seen on a first-come first-served basis, and a limited number are seen during each clinic.  ° °[facility] ° [address], [place], [place] (336) 723-7904    Eligibility Requirements °You must have lived in [place], [place], or [place] counties for at least the last three months. °  You cannot be eligible for state or federal sponsored healthcare insurance, including Veterans Administration, Medicaid, or Medicare. °  You generally cannot be eligible for healthcare insurance through your employer.  °  How to apply: °Eligibility screenings are held every Tuesday and Wednesday afternoon from 1:00 pm until 4:00 pm. You do not need an appointment for the interview!  °[facility] [address], [place], [place] 336-631-2330   °[facility]  336-342-8273   °[facility]  336-703-3100   °[facility]  336-570-6415   ° °Behavioral Health Resources in the Community: °Intensive Outpatient Programs °Organization         Address  Phone  Notes  °[facility] [address], [place], [place] 336-878-6098   °[facility] [address], [place], [place] 336-832-9800   °[facility] [address], [place], [place] ° 336-882-2125   °[facility] [address],  °[place], [place] 1-800-853-5163 or 336-641-4981   °Substance Abuse Resources °Organization         Address  Phone  Notes  °[facility]  336-882-2125   °[facility]  336-784-9470   °[facility]  336-285-9073   °[facility]  336-845-3988   °Residential & Outpatient Substance Abuse Program  1-800-659-3381   °Psychological Services °Organization         Address  Phone  Notes  °[facility]  336- 832-9600   °[facility]  336- 378-7881   °[facility] [address], [place] 1-800-853-5163 or 336-641-4981   ° °Mobile Crisis Teams °Organization         Address  Phone  Notes  °[facility], Mobile Crisis Care Unit  1-877-626-1772   °Assertive °[facility] ° [address]  [place], [place] 336-834-9664   °Sharon DeEsch [address] °[place] 336-554-5454   ° °Self-Help/Support Groups °Organization         Address  Phone             Notes  °[facility] [place] [facility] - variety of support groups  336- 373-1402 Call for more information  °Narcotics Anonymous (NA), [facility] [address], °[place]  2 meetings at this location  ° °  Residential Treatment Programs °Organization         Address  Phone  Notes  °[facility] [address],    °[place]  1-866-801-8205   °[facility] ° [address], [place], [place] 704-293-8524   °[facility] [address], [place] 336-845-3988 Admissions: 8am-3pm M-F  °[facility] [address]    °[place], [place] 336-841-1104   °[facility] [address], [place], [place] 336-379-7146   °[facility] [address]  °[place], [place] 336-285-9073   °[facility] - Intensive Outpatient [address], [place], [place] 336-852-3033   °[facility] [address]  °[place], [place] 1-877-615-2722 or 336-784-9470   °[facility] [address] [place], [place] 336-227-7417 Accepts Medicaid  °[facility] [address]  ° [place] 1-800-659-3381 Substance Abuse/Addiction Treatment  ° °[place] Behavioral Health Resources °Organization         Address  Phone  Notes  °[facility]  (888) 581-9988   °Julie Brannon, PhD [address] [place], [place]   (336) 349-5553 or (336) 951-0000   °[facility]   [address] °[place], [place] (336) 349-4454   °[facility] [address], [place], [place] (336) 342-8316 Insurance/Medicaid/sponsorship through [facility]  °[facility] [address]                                    [place], [place] (336) 342-8316 Therapy/tele-psych/case    °[facility] [address]  ° [place], [place] (336) 349-2233    °Dr. Arfeen  (336) 349-4544   °[facility] [place]  [employer] [place] [facility] 1) [address],  °2) [address], [place] °3)  [address], [place] (336) 349-3220 °(336) 342-7768 ° °(336) 342-8140   °[place] [facility] (336) 342-1394 or (336) 342-3537 (After Hours)    ° ° °

## 2015-05-09 NOTE — ED Notes (Signed)
Observed pt with increased agitation. [employer] officers involved. Pt placed in metal restraints to assist pt to comply for safety.  Charge Acupuncturisttacey RN present. EDP Kohut rescinded IV PAPERS

## 2015-05-09 NOTE — Progress Notes (Signed)
CSW contacted outside facilities which were all at capacity.  CSW faxed referrals to the following inpatient psych facilities that are at capacity, but taking referrals:  [facility] [place]  Terril Chestnut LCSW,LCAS Behavioral Health Disposition CSW [phone]   

## 2015-05-09 NOTE — ED Notes (Signed)
Pt has three (3) patient belonging bags at Triage nurse's desk. 1. Contains black ball cap and red and black coat. 2nd bag has camouflage pants, t-shirt socks and 3rd bag has boots.

## 2015-05-09 NOTE — ED Notes (Signed)
MD at bedside. EDP PRESENT 

## 2015-05-09 NOTE — BH Assessment (Signed)
1109:  Consulted with Rondel BatonViranda Pickering, NP about the Patient.  She reports the Patient is experiencing delusional thoughts.  [facility]   1110:  Start tele-assessment  1120:  Complete tele-assessment  1130:  Consulted with Verne SpurrNeil Mashburn, PA:  Per PA Mashburn:  Patient meets inpatient criteria, BAL results pending.    1150:  Rondel BatonViranda Pickering, NP provided with Patient disposition.
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
## 2021-02-19 ENCOUNTER — Ambulatory Visit (HOSPITAL_COMMUNITY)
Admission: EM | Admit: 2021-02-19 | Discharge: 2021-02-19 | Disposition: A | Discharge status: Other Acute Inpatient Hospital | Payer: Medicare Other | Attending: Licensed Clinical Social Worker | Admitting: Licensed Clinical Social Worker

## 2021-02-19 ENCOUNTER — Other Ambulatory Visit: Payer: Self-pay

## 2021-02-19 ENCOUNTER — Emergency Department (HOSPITAL_COMMUNITY)
Admission: EM | Admit: 2021-02-19 | Discharge: 2021-02-21 | Disposition: A | Discharge status: Home / Self Care | Payer: Medicare Other | Source: Home / Self Care | Attending: Emergency Medicine | Admitting: Emergency Medicine

## 2021-02-19 ENCOUNTER — Encounter (HOSPITAL_COMMUNITY): Payer: Self-pay | Admitting: Emergency Medicine

## 2021-02-19 DIAGNOSIS — F312 Bipolar disorder, current episode manic severe with psychotic features: Secondary | ICD-10-CM | POA: Diagnosis not present

## 2021-02-19 DIAGNOSIS — G8929 Other chronic pain: Secondary | ICD-10-CM | POA: Insufficient documentation

## 2021-02-19 DIAGNOSIS — M545 Low back pain, unspecified: Secondary | ICD-10-CM | POA: Insufficient documentation

## 2021-02-19 DIAGNOSIS — F17210 Nicotine dependence, cigarettes, uncomplicated: Secondary | ICD-10-CM | POA: Insufficient documentation

## 2021-02-19 DIAGNOSIS — R456 Violent behavior: Secondary | ICD-10-CM | POA: Insufficient documentation

## 2021-02-19 DIAGNOSIS — Z20822 Contact with and (suspected) exposure to covid-19: Secondary | ICD-10-CM | POA: Insufficient documentation

## 2021-02-19 DIAGNOSIS — I10 Essential (primary) hypertension: Secondary | ICD-10-CM | POA: Insufficient documentation

## 2021-02-19 DIAGNOSIS — F3163 Bipolar disorder, current episode mixed, severe, without psychotic features: Secondary | ICD-10-CM | POA: Diagnosis not present

## 2021-02-19 DIAGNOSIS — F1020 Alcohol dependence, uncomplicated: Secondary | ICD-10-CM | POA: Diagnosis not present

## 2021-02-19 DIAGNOSIS — F3164 Bipolar disorder, current episode mixed, severe, with psychotic features: Secondary | ICD-10-CM | POA: Diagnosis present

## 2021-02-19 DIAGNOSIS — R4689 Other symptoms and signs involving appearance and behavior: Secondary | ICD-10-CM

## 2021-02-19 DIAGNOSIS — Z79899 Other long term (current) drug therapy: Secondary | ICD-10-CM | POA: Insufficient documentation

## 2021-02-19 LAB — COMPREHENSIVE METABOLIC PANEL
ALT: 54 U/L — ABNORMAL HIGH (ref 0–44)
AST: 70 U/L — ABNORMAL HIGH (ref 15–41)
Albumin: 4 g/dL (ref 3.5–5.0)
Alkaline Phosphatase: 101 U/L (ref 38–126)
Anion gap: 11 (ref 5–15)
BUN: 14 mg/dL (ref 8–23)
CO2: 26 mmol/L (ref 22–32)
Calcium: 9.2 mg/dL (ref 8.9–10.3)
Chloride: 99 mmol/L (ref 98–111)
Creatinine, Ser: 1.28 mg/dL — ABNORMAL HIGH (ref 0.61–1.24)
GFR, Estimated: >60 mL/min (ref >60)
Glucose, Bld: 187 mg/dL — ABNORMAL HIGH (ref 70–99)
Potassium: 4 mmol/L (ref 3.5–5.1)
Sodium: 136 mmol/L (ref 135–145)
Total Bilirubin: 1.2 mg/dL (ref 0.3–1.2)
Total Protein: 6.9 g/dL (ref 6.5–8.1)

## 2021-02-19 LAB — CBC
HCT: 46.8 % (ref 39.0–52.0)
Hemoglobin: 15.5 g/dL (ref 13.0–17.0)
MCH: 31.4 pg (ref 26.0–34.0)
MCHC: 33.1 g/dL (ref 30.0–36.0)
MCV: 94.7 fL (ref 80.0–100.0)
Platelets: 439 10*3/uL — ABNORMAL HIGH (ref 150–400)
RBC: 4.94 MIL/uL (ref 4.22–5.81)
RDW: 13.2 % (ref 11.5–15.5)
WBC: 15.3 10*3/uL — ABNORMAL HIGH (ref 4.0–10.5)
nRBC: 0 % (ref 0.0–0.2)

## 2021-02-19 LAB — SALICYLATE LEVEL: Salicylate Lvl: <7 mg/dL — ABNORMAL LOW (ref 7.0–30.0)

## 2021-02-19 LAB — ETHANOL: Alcohol, Ethyl (B): <10 mg/dL (ref <10)

## 2021-02-19 LAB — ACETAMINOPHEN LEVEL: Acetaminophen (Tylenol), Serum: 11 ug/mL (ref 10–30)

## 2021-02-19 MED ORDER — LORAZEPAM 1 MG PO TABS
2.0000 mg | ORAL_TABLET | Freq: Once | ORAL | Status: AC
  Administered 2021-02-19: 2 mg via ORAL
  Filled 2021-02-19: qty 2

## 2021-02-19 MED ORDER — AMLODIPINE BESYLATE 5 MG PO TABS
10.0000 mg | ORAL_TABLET | Freq: Every day | ORAL | Status: DC
Start: 2021-02-19 — End: 2021-02-21
  Administered 2021-02-19 – 2021-02-21 (×3): 10 mg via ORAL
  Filled 2021-02-19 (×3): qty 2

## 2021-02-19 MED ORDER — LORAZEPAM 1 MG PO TABS
2.0000 mg | ORAL_TABLET | Freq: Four times a day (QID) | ORAL | Status: DC | PRN
  Administered 2021-02-19 – 2021-02-21 (×4): 2 mg via ORAL
  Filled 2021-02-19 (×4): qty 2

## 2021-02-19 MED ORDER — DIPHENHYDRAMINE HCL 25 MG PO CAPS
50.0000 mg | ORAL_CAPSULE | Freq: Once | ORAL | Status: AC
  Administered 2021-02-19: 50 mg via ORAL
  Filled 2021-02-19: qty 2

## 2021-02-19 MED ORDER — MORPHINE SULFATE (PF) 4 MG/ML IV SOLN
4.0000 mg | Freq: Once | INTRAVENOUS | Status: DC
Start: 2021-02-19 — End: 2021-02-19

## 2021-02-19 MED ORDER — MORPHINE SULFATE (PF) 4 MG/ML IV SOLN
4.0000 mg | Freq: Once | INTRAVENOUS | Status: AC
  Administered 2021-02-19: 4 mg via INTRAMUSCULAR
  Filled 2021-02-19: qty 1
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
## 2021-02-19 NOTE — ED Triage Notes (Signed)
Pt to triage via [employer] from [facility] with IVC papers. Per papers pt was threatening "an air strike on [place]".  Pt denies SI and states he is "homicidal towards anyone that f*cks with FANCY."  He is referring to the [facility] officer that brought him as "Fancy".  Reports severe lower back pain.

## 2021-02-19 NOTE — ED Notes (Signed)
Blood pressure re-take on right arm 220/139 pulse 127.

## 2021-02-19 NOTE — ED Notes (Addendum)
Pt sleeping now, notified sitter of UDS needed Primary RN said to let pt sleep and reassess vitals shortly
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
## 2021-02-19 NOTE — Progress Notes (Signed)
Per Berneice Heinrich, NP patient meets criteria for inpatient treatment. There are no available beds at [facility] currently. CSW faxed referrals to the following facilities for review:  [facility]  TTS will continue to seek bed placement.   Trula Slade, MSW, LCSW Clinical Social Worker 02/19/2021 5:03 PM

## 2021-02-19 NOTE — ED Notes (Signed)
Pt screaming and yelling obscenities after getting the shot of morphine.
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
## 2021-02-19 NOTE — ED Notes (Signed)
Pt yelling, "I need 4cc of Dilaudid STAT.  Tell that woman to get in here."

## 2021-02-20 LAB — SARS CORONAVIRUS 2 (TAT 6-24 HRS): SARS Coronavirus 2: NEGATIVE

## 2021-02-20 MED ORDER — QUETIAPINE FUMARATE 25 MG PO TABS
25.0000 mg | ORAL_TABLET | Freq: Every day | ORAL | Status: DC
  Administered 2021-02-20 – 2021-02-21 (×2): 25 mg via ORAL
  Filled 2021-02-20 (×2): qty 1

## 2021-02-20 MED ORDER — ACETAMINOPHEN 325 MG PO TABS
650.0000 mg | ORAL_TABLET | Freq: Four times a day (QID) | ORAL | Status: DC | PRN
  Administered 2021-02-20 – 2021-02-21 (×3): 650 mg via ORAL
  Filled 2021-02-20 (×3): qty 2

## 2021-02-20 MED ORDER — METHOCARBAMOL 500 MG PO TABS
750.0000 mg | ORAL_TABLET | Freq: Three times a day (TID) | ORAL | Status: DC | PRN
  Administered 2021-02-20 – 2021-02-21 (×2): 750 mg via ORAL
  Filled 2021-02-20 (×2): qty 2

## 2021-02-20 NOTE — ED Notes (Signed)
Pt now currently eating lunch tray.

## 2021-02-20 NOTE — ED Notes (Signed)
Pt appears to be very agitated. Pacing around in the halls. Needs to be redirected. Yells at staff when being redirected.

## 2021-02-20 NOTE — ED Notes (Signed)
Pt showering. Gave pt new scrub top but we dont not have any pants. When told that we did have any other pants for the pt he told this RN to "fuck off" and slammed the door.

## 2021-02-20 NOTE — BH Assessment (Signed)
Pt remains at the ED under IVC due to homicidal ideation.  Per notes, Pt has been acting in aggressive manner, has appeared agitated and threatening, and has exhibited bizarre behavior.  Pt was reassessed today.  He was lying down and was calm.  Pt reported that he was at the hospital because of lower back pain, and he asked for more pain medication.    Pt said he is not being treated well at [facility].  Asked Pt if he wanted to harm anyone.  Pt denied suicidal ideation, homicidal ideation, and hallucination.  Pt had little insight into why he is at [facility].  Recommend continued inpatient.

## 2021-02-20 NOTE — ED Provider Notes (Signed)
Emergency Medicine Observation Re-evaluation Note  Stephen Delacruz is a 65 y.o. male, seen on rounds today.  Pt initially presented to the ED for complaints of Homicidal Currently, the patient is 65 year old male since from [facility] for medical eval after him being placed in IVC for homicidal behavior.  Per nurse report patient has been somewhat hyperverbal anxious during his stay here.  No outbursts however.  He has not received any IM antipsychotics.  It does appear that patient is prescribed Seroquel.  We will provide him with this as well as as needed Robaxin and as needed Tylenol for his back pain.  Physical Exam  BP (!) 155/78 (BP Location: Right Arm)   Pulse 98   Temp 97.7 F (36.5 C) (Oral)   Resp 18   SpO2 94%  CONSTITUTIONAL:  well-appearing, NAD NEURO:  Alert and oriented x 3, no focal deficits EYES:  pupils equal and reactive ENT/NECK:  trachea midline, no JVD CARDIO:  reg rate, reg rhythm, well-perfused PULM:  None labored breathing GI/GU:  Abdomen non-distended MSK/SPINE:  No gross deformities, no edema SKIN:  no rash obvious, atraumatic, no ecchymosis  PSYCH:  Appropriate speech and behavior   ED Course / MDM  EKG:  Clinical Course as of 02/20/21 1113  Sat Feb 19, 2021  1273 65 year old male prior psych history sent over from [facility] for medical evaluation after being placed on an IVC for homicidal behavior. Patient is quite loud and aggressive. Police are staying with him. Lab work showing some mild elevations in transaminases and an elevated white count. No indications for medical admission. Will review with behavioral health as they are anticipating inpatient psychiatric admission. [MB]    Clinical Course User Index [MB] Stephen Files, MD   I have reviewed the labs performed to date as well as medications administered while in observation.  Recent changes in the last 24 hours include no significant changes.  Plan   Patient is awaiting psychiatric placement.  TTS  has evaluated patient and will continue to help with that placement.  Patient is under full IVC at this time.  Tylenol and Robaxin ordered as as needed medications for back pain. Also he has been started on daily Seroquel which she has been prescribed in the past but is not been taking for some time.    Stephen Delacruz, [place] 02/20/21 1118    Stephen Dibbles, MD 02/22/21 [phone]

## 2021-02-20 NOTE — ED Notes (Addendum)
Pt awake. Walking out into hall. Redirected by myself and sitter and pt began to began yelling and mocking staff.

## 2021-02-20 NOTE — ED Notes (Signed)
Patient pacing the unit and pulled the code button in room; pt laughs and states that it keeps everybody on their toes; Pt has been asked not to touch the code box-Monique,RN

## 2021-02-20 NOTE — ED Notes (Signed)
Behavorial health called tts to chat with pt

## 2021-02-20 NOTE — ED Notes (Signed)
Pt up out of room stating that there is a "big shot supposed to be coming to see him". This RN informed pt that there is no one coming to see him tonight and pt was oriented to time of day. Pt laid back down in bed.

## 2021-02-20 NOTE — ED Notes (Signed)
Pt appears agitated, out of room pacing. Upset due to wanting to shower. This RN explained that pt has already taken a shower today and will get another one again tomorrow.

## 2021-02-21 ENCOUNTER — Other Ambulatory Visit: Payer: Self-pay

## 2021-02-21 ENCOUNTER — Other Ambulatory Visit: Payer: Self-pay | Admitting: Psychiatric/Mental Health

## 2021-02-21 ENCOUNTER — Encounter (HOSPITAL_COMMUNITY): Payer: Self-pay | Admitting: Family

## 2021-02-21 ENCOUNTER — Inpatient Hospital Stay (HOSPITAL_COMMUNITY)
Admission: AD | Admit: 2021-02-21 | Discharge: 2021-02-23 | DRG: 885 | Disposition: A | Discharge status: Home / Self Care | Payer: Medicare Other | Source: Intra-hospital | Attending: Psychiatry | Admitting: Psychiatry

## 2021-02-21 DIAGNOSIS — Z818 Family history of other mental and behavioral disorders: Secondary | ICD-10-CM | POA: Diagnosis not present

## 2021-02-21 DIAGNOSIS — G4700 Insomnia, unspecified: Secondary | ICD-10-CM | POA: Diagnosis present

## 2021-02-21 DIAGNOSIS — Z79899 Other long term (current) drug therapy: Secondary | ICD-10-CM

## 2021-02-21 DIAGNOSIS — R45850 Homicidal ideations: Secondary | ICD-10-CM | POA: Diagnosis not present

## 2021-02-21 DIAGNOSIS — R739 Hyperglycemia, unspecified: Secondary | ICD-10-CM | POA: Diagnosis present

## 2021-02-21 DIAGNOSIS — Z20822 Contact with and (suspected) exposure to covid-19: Secondary | ICD-10-CM | POA: Diagnosis not present

## 2021-02-21 DIAGNOSIS — Z820 Family history of epilepsy and other diseases of the nervous system: Secondary | ICD-10-CM

## 2021-02-21 DIAGNOSIS — Z9151 Personal history of suicidal behavior: Secondary | ICD-10-CM

## 2021-02-21 DIAGNOSIS — E569 Vitamin deficiency, unspecified: Secondary | ICD-10-CM | POA: Diagnosis present

## 2021-02-21 DIAGNOSIS — Z8249 Family history of ischemic heart disease and other diseases of the circulatory system: Secondary | ICD-10-CM | POA: Diagnosis not present

## 2021-02-21 DIAGNOSIS — I10 Essential (primary) hypertension: Secondary | ICD-10-CM | POA: Diagnosis not present

## 2021-02-21 DIAGNOSIS — F909 Attention-deficit hyperactivity disorder, unspecified type: Secondary | ICD-10-CM | POA: Diagnosis present

## 2021-02-21 DIAGNOSIS — Z888 Allergy status to other drugs, medicaments and biological substances status: Secondary | ICD-10-CM | POA: Diagnosis not present

## 2021-02-21 DIAGNOSIS — F1290 Cannabis use, unspecified, uncomplicated: Secondary | ICD-10-CM | POA: Diagnosis present

## 2021-02-21 DIAGNOSIS — F319 Bipolar disorder, unspecified: Secondary | ICD-10-CM | POA: Diagnosis present

## 2021-02-21 DIAGNOSIS — F17210 Nicotine dependence, cigarettes, uncomplicated: Secondary | ICD-10-CM | POA: Diagnosis not present

## 2021-02-21 DIAGNOSIS — Z9114 Patient's other noncompliance with medication regimen: Secondary | ICD-10-CM

## 2021-02-21 DIAGNOSIS — M549 Dorsalgia, unspecified: Secondary | ICD-10-CM | POA: Diagnosis present

## 2021-02-21 DIAGNOSIS — F3163 Bipolar disorder, current episode mixed, severe, without psychotic features: Secondary | ICD-10-CM | POA: Diagnosis not present

## 2021-02-21 DIAGNOSIS — Z87442 Personal history of urinary calculi: Secondary | ICD-10-CM

## 2021-02-21 DIAGNOSIS — F419 Anxiety disorder, unspecified: Secondary | ICD-10-CM | POA: Diagnosis present

## 2021-02-21 DIAGNOSIS — F4310 Post-traumatic stress disorder, unspecified: Secondary | ICD-10-CM | POA: Diagnosis not present

## 2021-02-21 LAB — RAPID URINE DRUG SCREEN, HOSP PERFORMED
Amphetamines: NOT DETECTED
Amphetamines: NOT DETECTED
Barbiturates: NOT DETECTED
Barbiturates: NOT DETECTED
Benzodiazepines: POSITIVE — AB
Benzodiazepines: NOT DETECTED
Cocaine: NOT DETECTED
Cocaine: NOT DETECTED
Opiates: NOT DETECTED
Opiates: NOT DETECTED
Tetrahydrocannabinol: NOT DETECTED
Tetrahydrocannabinol: NOT DETECTED

## 2021-02-21 LAB — POC SARS CORONAVIRUS 2 AG -  ED: SARS Coronavirus 2 Ag: NEGATIVE

## 2021-02-21 MED ORDER — HYDROXYZINE HCL 25 MG PO TABS: 25.0000 mg | ORAL_TABLET | Freq: Four times a day (QID) | ORAL | Status: DC | PRN

## 2021-02-21 MED ORDER — ONDANSETRON 4 MG PO TBDP: 4.0000 mg | ORAL_TABLET | Freq: Four times a day (QID) | ORAL | Status: DC | PRN

## 2021-02-21 MED ORDER — LORAZEPAM 1 MG PO TABS: 1.0000 mg | ORAL_TABLET | Freq: Once | ORAL | Status: DC | PRN

## 2021-02-21 MED ORDER — ZIPRASIDONE MESYLATE 20 MG IM SOLR: 20.0000 mg | Freq: Four times a day (QID) | INTRAMUSCULAR | Status: DC | PRN

## 2021-02-21 MED ORDER — ALUM & MAG HYDROXIDE-SIMETH 200-200-20 MG/5ML PO SUSP: 30.0000 mL | ORAL | Status: DC | PRN

## 2021-02-21 MED ORDER — LORAZEPAM 1 MG PO TABS: 2.0000 mg | ORAL_TABLET | Freq: Four times a day (QID) | ORAL | Status: DC | PRN

## 2021-02-21 MED ORDER — LORAZEPAM 2 MG/ML IJ SOLN: 1.0000 mg | Freq: Four times a day (QID) | INTRAMUSCULAR | Status: DC | PRN

## 2021-02-21 MED ORDER — THIAMINE HCL 100 MG PO TABS
100.0000 mg | ORAL_TABLET | Freq: Every day | ORAL | Status: DC
  Administered 2021-02-22 – 2021-02-23 (×2): 100 mg via ORAL
  Filled 2021-02-21 (×4): qty 1

## 2021-02-21 MED ORDER — QUETIAPINE FUMARATE 300 MG PO TABS
300.0000 mg | ORAL_TABLET | Freq: Every day | ORAL | Status: DC
  Filled 2021-02-21: qty 1

## 2021-02-21 MED ORDER — CLONIDINE HCL 0.1 MG PO TABS
0.1000 mg | ORAL_TABLET | Freq: Three times a day (TID) | ORAL | Status: DC | PRN
  Administered 2021-02-21 – 2021-02-23 (×2): 0.1 mg via ORAL
  Filled 2021-02-21 (×2): qty 1

## 2021-02-21 MED ORDER — TRAZODONE HCL 50 MG PO TABS
50.0000 mg | ORAL_TABLET | Freq: Every evening | ORAL | Status: DC | PRN
  Administered 2021-02-21 – 2021-02-22 (×2): 50 mg via ORAL
  Filled 2021-02-21 (×2): qty 1

## 2021-02-21 MED ORDER — NICOTINE 21 MG/24HR TD PT24
21.0000 mg | MEDICATED_PATCH | Freq: Every day | TRANSDERMAL | Status: DC
  Filled 2021-02-21: qty 1

## 2021-02-21 MED ORDER — LORAZEPAM 1 MG PO TABS
2.0000 mg | ORAL_TABLET | ORAL | Status: AC
  Administered 2021-02-21: 2 mg via ORAL
  Filled 2021-02-21: qty 2

## 2021-02-21 MED ORDER — OXCARBAZEPINE 150 MG PO TABS
150.0000 mg | ORAL_TABLET | Freq: Two times a day (BID) | ORAL | Status: DC
  Administered 2021-02-22 – 2021-02-23 (×3): 150 mg via ORAL
  Filled 2021-02-21 (×6): qty 1

## 2021-02-21 MED ORDER — AMLODIPINE BESYLATE 10 MG PO TABS
10.0000 mg | ORAL_TABLET | Freq: Every day | ORAL | Status: DC
  Administered 2021-02-22 – 2021-02-23 (×2): 10 mg via ORAL
  Filled 2021-02-21: qty 1
  Filled 2021-02-21: qty 2
  Filled 2021-02-21 (×2): qty 1

## 2021-02-21 MED ORDER — QUETIAPINE FUMARATE 25 MG PO TABS
25.0000 mg | ORAL_TABLET | Freq: Two times a day (BID) | ORAL | Status: DC
  Filled 2021-02-21 (×2): qty 1

## 2021-02-21 MED ORDER — NICOTINE 21 MG/24HR TD PT24
21.0000 mg | MEDICATED_PATCH | Freq: Once | TRANSDERMAL | Status: DC
  Administered 2021-02-21: 21 mg via TRANSDERMAL
  Filled 2021-02-21: qty 1

## 2021-02-21 MED ORDER — MAGNESIUM HYDROXIDE 400 MG/5ML PO SUSP: 30.0000 mL | Freq: Every day | ORAL | Status: DC | PRN

## 2021-02-21 MED ORDER — ZIPRASIDONE MESYLATE 20 MG IM SOLR: 20.0000 mg | Freq: Once | INTRAMUSCULAR | Status: DC | PRN

## 2021-02-21 MED ORDER — OLANZAPINE 5 MG PO TBDP
15.0000 mg | ORAL_TABLET | Freq: Every day | ORAL | Status: DC
  Administered 2021-02-21 – 2021-02-22 (×2): 15 mg via ORAL
  Filled 2021-02-21 (×4): qty 1

## 2021-02-21 MED ORDER — LORAZEPAM 1 MG PO TABS: 1.0000 mg | ORAL_TABLET | Freq: Four times a day (QID) | ORAL | Status: DC | PRN

## 2021-02-21 MED ORDER — HYDROXYZINE HCL 25 MG PO TABS
25.0000 mg | ORAL_TABLET | Freq: Four times a day (QID) | ORAL | Status: DC | PRN
  Administered 2021-02-21 – 2021-02-23 (×4): 25 mg via ORAL
  Filled 2021-02-21 (×4): qty 1

## 2021-02-21 MED ORDER — GABAPENTIN 100 MG PO CAPS
100.0000 mg | ORAL_CAPSULE | Freq: Three times a day (TID) | ORAL | Status: DC
  Administered 2021-02-21 – 2021-02-23 (×6): 100 mg via ORAL
  Filled 2021-02-21 (×10): qty 1

## 2021-02-21 MED ORDER — LOPERAMIDE HCL 2 MG PO CAPS: 2.0000 mg | ORAL_CAPSULE | ORAL | Status: DC | PRN

## 2021-02-21 MED ORDER — ACETAMINOPHEN 325 MG PO TABS
650.0000 mg | ORAL_TABLET | Freq: Four times a day (QID) | ORAL | Status: DC | PRN
  Administered 2021-02-21 – 2021-02-23 (×4): 650 mg via ORAL
  Filled 2021-02-21 (×4): qty 2

## 2021-02-21 MED ORDER — ADULT MULTIVITAMIN W/MINERALS CH
1.0000 | ORAL_TABLET | Freq: Every day | ORAL | Status: DC
  Administered 2021-02-21 – 2021-02-23 (×3): 1 via ORAL
  Filled 2021-02-21 (×6): qty 1

## 2021-02-21 MED ORDER — OLANZAPINE 10 MG PO TBDP: 10.0000 mg | ORAL_TABLET | Freq: Three times a day (TID) | ORAL | Status: DC | PRN

## 2021-02-21 MED ORDER — LORAZEPAM 1 MG PO TABS
1.0000 mg | ORAL_TABLET | Freq: Four times a day (QID) | ORAL | Status: DC | PRN
  Administered 2021-02-22: 1 mg via ORAL
  Filled 2021-02-21: qty 1

## 2021-02-21 MED ORDER — OLANZAPINE 5 MG PO TBDP
5.0000 mg | ORAL_TABLET | ORAL | Status: AC
  Administered 2021-02-21: 5 mg via ORAL
  Filled 2021-02-21 (×2): qty 1

## 2021-02-21 MED ORDER — LORAZEPAM 2 MG/ML IJ SOLN: 1.0000 mg | Freq: Once | INTRAMUSCULAR | Status: DC | PRN

## 2021-02-21 MED ORDER — NICOTINE POLACRILEX 2 MG MT GUM
2.0000 mg | CHEWING_GUM | OROMUCOSAL | Status: DC | PRN
  Filled 2021-02-21: qty 10

## 2021-02-21 NOTE — ED Notes (Signed)
Pt given crackers and a drink 

## 2021-02-21 NOTE — Tx Team (Signed)
clary Initial Treatment Plan 02/21/2021 4:50 PM Stephen Delacruz ZPS:[id number]    PATIENT STRESSORS: Health problems Substance abuse   PATIENT STRENGTHS: Ability for insight Active sense of humor Motivation for treatment/growth Supportive family/friends   PATIENT IDENTIFIED PROBLEMS: anxiety  Substance use  Homicidal ideations                 DISCHARGE CRITERIA:  Ability to meet basic life and health needs Improved stabilization in mood, thinking, and/or behavior Medical problems require only outpatient monitoring Need for constant or close observation no longer present  PRELIMINARY DISCHARGE PLAN: Attend aftercare/continuing care group Attend 12-step recovery group Return to previous living arrangement  PATIENT/FAMILY INVOLVEMENT: This treatment plan has been presented to and reviewed with the patient, Stephen Delacruz.  The patient and family have been given the opportunity to ask questions and make suggestions.  Raylene Miyamoto, RN 02/21/2021, 4:50 PM

## 2021-02-21 NOTE — ED Notes (Addendum)
Pt asking to make a phone call. This RN agreed. Pt attempted to call wife and sister. Neither family member answered. Pt asking if he can try again later. This RN informed him that he would only be allowed one more phone call for the day.

## 2021-02-21 NOTE — Progress Notes (Signed)
Patient ID: Stephen Delacruz, male   DOB: [birth year], 65 y.o.   MRN: [id number] Admission Note  Pt is a 65 yo male that presents voluntarily on 02/21/2021 with worsening anxiety, depression, and homicidal ideations. Pt states they have been going through a depressive phase for the past five years, has been noncompliant with medications, and felt like they needed help. Pt denies substance use. Pt is a ppd smoker. 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 Pt is pleasant. Pt endorses homicidal ideations with no person specific as victim. Pt denies si. Pt is vague about avh but states they live with ghosts. Pt denies current si/hi/ah/vh and verbally agrees to approach staff if these become apparent and/or before harming themselves/others. Consents signed, handbook detailing the patient's rights, responsibilities, and visitor guidelines provided. Skin/belongings search completed and patient oriented to unit. Patient stable at this time. Patient given the opportunity to express concerns and ask questions. Patient given toiletries. Will continue to monitor.    [facility] Assessment 02/19/2021:  Stephen Delacruz is a 65 yo male presenting as a walk in to [facility] for psychiatric support--pt states that he wants medication urgently. Pt states that he is a patient of Dr. Evelene Croon and has not had an appt with Dr. Evelene Croon since 2015. Pt states that he made an appt with Dr. Evelene Croon and could not see her until September 2022. Pt states that he has a therapist and has not seen his therapist since 2011.Pt reports that he has been diagnosed with the following diagnoses in the past: "borderline schizophrenic"--"nobody has formally diagnosed me with that, but I can use that to get out of a lot of things". Bipolar disorder "I call it manic depressive", OCD, ADHD, and PTSD. Pt initially reported that he did not have SI, but made the following statements "every day I go to bed and pray that I  don't wake up" "I have nothing to live for". Pt also denies HI but made the following statements "I can order an airstrike right here in [place] and blow this motherfucker up" "you can't make me stay here or I will rain fire and brimstone on your ass" "If you hold me against my will, I would hate to have to take someone out"--statement directed towards security officer and nurse practitioner--and gestured with his hands "I don't need any weapons--I feel sorry for anyone that tries to fuck with me". Pt made threatening statements to both security guard and nurse practitioner completing the assessment. Pt admits that he has both visual and auditory hallucinations. Pt did not disclose whether hallucinations were commanding him to engage in any type of behavior. Pt has a history of daily diazepam use and daily etoh use. Pt reports that he has smoked crack in the past. Pt is presenting as very angry, irritable, and labile throughout assessment. Pt admits that he is non compliant with all medications other than diazepam. Pt reports that he has been to "mental health court" and was assisted by someone with the last name "Henson".

## 2021-02-21 NOTE — ED Notes (Signed)
Lunch Tray Ordered @ 1001. 

## 2021-02-21 NOTE — Progress Notes (Signed)
Pt accepted to [facility] room 504-1  Meets inpatient criteria per Berneice Heinrich, NP.   Dr.Clary is the attending provider.    Call report to [phone]    Jamey Ripa, RN @ [facility] ED notified via secure chat.     Pt is IVC.    Pt may be transported by [facility]   Pt scheduled  to arrive at [facility] at 1500 PM.  Signed:  Corky Crafts, MSW, [place], LCASA 02/21/2021 1:58 PM

## 2021-02-21 NOTE — ED Notes (Signed)
Pt coming out of the room getting loud wanting to hang out in the hallway. This RN asked pt to go back into the room that I understand the night shift RN allowed you to hang out, out here but there is a lot going on this morning so I need you to please have a seat in your room. The pt stated No I want pain medicine. Officer Thad Ranger was able to talk pt down and have him go back to this room. Will continue to monitor.

## 2021-02-21 NOTE — Progress Notes (Addendum)
Pt stated he was feeling better since coming in.  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 Denies  Danger to Others  Danger to Others None reported or observed

## 2021-02-21 NOTE — ED Notes (Signed)
Pt asked for coffee. Pt calm and cooperative.Will continue to monitor

## 2021-02-21 NOTE — ED Provider Notes (Signed)
Emergency Medicine Observation Re-evaluation Note  Stephen Delacruz is a 65 y.o. male, seen on rounds today.  Pt initially presented to the ED for complaints of Homicidal Currently, the patient is eating.  Physical Exam  BP (!) 149/90 (BP Location: Right Arm)   Pulse 98   Temp (!) 97.5 F (36.4 C) (Oral)   Resp 16   SpO2 97%  Physical Exam General: Alert Cardiac: Normal rate and rhythm Lungs: Clear Psych: Cooperative at this time  ED Course / MDM  EKG:  Clinical Course as of 02/21/21 1025  Sat Feb 19, 2021  617 65 year old male prior psych history sent over from [facility] for medical evaluation after being placed on an IVC for homicidal behavior. Patient is quite loud and aggressive. Police are staying with him. Lab work showing some mild elevations in transaminases and an elevated white count. No indications for medical admission. Will review with behavioral health as they are anticipating inpatient psychiatric admission. [MB]    Clinical Course User Index [MB] Terrilee Files, MD   I have reviewed the labs performed to date as well as medications administered while in observation.  Recent changes in the last 24 hours include N/A. Pt has been able to be redirected when he becomes agitated.  Plan  Current plan is for inpatient treatment. Patient is under full IVC at this time.   Tanda Rockers, PA-C 02/21/21 1026    Blane Ohara, MD 02/26/21 [phone]

## 2021-02-21 NOTE — ED Notes (Signed)
Patient was given a Radio broadcast assistant and Cup of Decaf Coffee and Cup of Water.

## 2021-02-21 NOTE — Progress Notes (Signed)
Adult Psychoeducational Group Note  Date:  02/21/2021 Time:  11:36 PM  Group Topic/Focus:  Wrap-Up Group:   The focus of this group is to help patients review their daily goal of treatment and discuss progress on daily workbooks.  Participation Level:  Did Not Attend  Participation Quality:  Did Not Attend  Affect:  Did Not Attend  Cognitive:  Did Not Attend  Insight: None  Engagement in Group:  Did Not Attend  Modes of Intervention:  Did Not Attend  Additional Comments:  Pt did not attend evening wrap up group tonight.  Felipa Furnace 02/21/2021, 11:36 PM

## 2021-02-21 NOTE — ED Notes (Addendum)
Pt asking to call his Brother. Pt left message with his Brother. His brother called back. Pt currently talking to his brother on the phone. This RN informed him that, that would be his final phone call for the day. Pt no agreeing to that but this RN will continue to monitor.

## 2021-02-21 NOTE — ED Provider Notes (Signed)
BH team indicates pt accepted to [facility], Dr Jola Babinski.   Pt is alert, content appearing. No acute distress.  Pt currently appear stable for movement to [facility].     Cathren Laine, MD 02/21/21 [phone]

## 2021-02-21 NOTE — Progress Notes (Signed)
Patient information has been sent to [facility] via secure chat to review for potential admission. Patient meets inpatient criteria per Berneice Heinrich, NP.   Situation ongoing, CSW will continue to monitor progress.    Signed:  Corky Crafts, MSW, [place], LCASA 02/21/2021 11:53 AM

## 2021-02-21 NOTE — ED Notes (Signed)
Pt up out of room sitting in chair near nurses station. Pt & I having small conversation. He is very pleasant this morning. Asked for pain meds. Pain meds given.

## 2021-02-22 DIAGNOSIS — F3163 Bipolar disorder, current episode mixed, severe, without psychotic features: Secondary | ICD-10-CM | POA: Diagnosis not present

## 2021-02-22 LAB — CBC WITH DIFFERENTIAL/PLATELET
Abs Immature Granulocytes: 0.06 10*3/uL (ref 0.00–0.07)
Basophils Absolute: 0.1 10*3/uL (ref 0.0–0.1)
Basophils Relative: 1 %
Eosinophils Absolute: 0.3 10*3/uL (ref 0.0–0.5)
Eosinophils Relative: 3 %
HCT: 46.9 % (ref 39.0–52.0)
Hemoglobin: 15.4 g/dL (ref 13.0–17.0)
Immature Granulocytes: 1 %
Lymphocytes Relative: 23 %
Lymphs Abs: 1.8 10*3/uL (ref 0.7–4.0)
MCH: 31.5 pg (ref 26.0–34.0)
MCHC: 32.8 g/dL (ref 30.0–36.0)
MCV: 95.9 fL (ref 80.0–100.0)
Monocytes Absolute: 0.6 10*3/uL (ref 0.1–1.0)
Monocytes Relative: 8 %
Neutro Abs: 4.9 10*3/uL (ref 1.7–7.7)
Neutrophils Relative %: 64 %
Platelets: 381 10*3/uL (ref 150–400)
RBC: 4.89 MIL/uL (ref 4.22–5.81)
RDW: 13.5 % (ref 11.5–15.5)
WBC: 7.7 10*3/uL (ref 4.0–10.5)
nRBC: 0 % (ref 0.0–0.2)

## 2021-02-22 LAB — COMPREHENSIVE METABOLIC PANEL
ALT: 66 U/L — ABNORMAL HIGH (ref 0–44)
AST: 48 U/L — ABNORMAL HIGH (ref 15–41)
Albumin: 4 g/dL (ref 3.5–5.0)
Alkaline Phosphatase: 90 U/L (ref 38–126)
Anion gap: 10 (ref 5–15)
BUN: 14 mg/dL (ref 8–23)
CO2: 23 mmol/L (ref 22–32)
Calcium: 9 mg/dL (ref 8.9–10.3)
Chloride: 104 mmol/L (ref 98–111)
Creatinine, Ser: 1.21 mg/dL (ref 0.61–1.24)
GFR, Estimated: >60 mL/min (ref >60)
Glucose, Bld: 131 mg/dL — ABNORMAL HIGH (ref 70–99)
Potassium: 4 mmol/L (ref 3.5–5.1)
Sodium: 137 mmol/L (ref 135–145)
Total Bilirubin: 0.7 mg/dL (ref 0.3–1.2)
Total Protein: 6.9 g/dL (ref 6.5–8.1)

## 2021-02-22 LAB — LIPID PANEL
Cholesterol: 192 mg/dL (ref 0–200)
HDL: 34 mg/dL — ABNORMAL LOW (ref >40)
LDL Cholesterol: 118 mg/dL — ABNORMAL HIGH (ref 0–99)
Total CHOL/HDL Ratio: 5.6 RATIO
Triglycerides: 202 mg/dL — ABNORMAL HIGH (ref <150)
VLDL: 40 mg/dL (ref 0–40)

## 2021-02-22 LAB — TSH: TSH: 2.356 u[IU]/mL (ref 0.350–4.500)

## 2021-02-22 LAB — HEMOGLOBIN A1C
Hgb A1c MFr Bld: 5.3 % (ref 4.8–5.6)
Mean Plasma Glucose: 105.41 mg/dL

## 2021-02-22 MED ORDER — IBUPROFEN 400 MG PO TABS
400.0000 mg | ORAL_TABLET | Freq: Four times a day (QID) | ORAL | Status: DC | PRN
  Administered 2021-02-22: 400 mg via ORAL

## 2021-02-22 MED ORDER — METHOCARBAMOL 500 MG PO TABS
500.0000 mg | ORAL_TABLET | Freq: Three times a day (TID) | ORAL | Status: DC | PRN
Start: 2021-02-22 — End: 2021-02-23
  Administered 2021-02-22: 500 mg via ORAL

## 2021-02-22 NOTE — Progress Notes (Signed)
Adult Psychoeducational Group Note  Date:  02/22/2021 Time:  10:34 PM  Group Topic/Focus:  Wrap-Up Group:   The focus of this group is to help patients review their daily goal of treatment and discuss progress on daily workbooks.  Participation Level:  Active  Participation Quality:  Appropriate  Affect:  Appropriate  Cognitive:  Appropriate  Insight: Appropriate  Engagement in Group:  Developing/Improving  Modes of Intervention:  Discussion  Additional Comments:  Pt stated his goal for today was to focus on his treatment plan. Pt stated he accomplished his goal today. Pt stated he was able to talk with his doctor and social worker about his care today. Pt rated his overall day a 7 out of 10. Pt stated been able to contact his brother and sister today improved his day. Pt stated he felt better about himself today. Pt stated his relationship with his family and support system needs to be improved. Pt stated he was able to attend all meals. Pt stated he took all medications provided today. Pt stated his appetite was pretty good today. Pt rated sleep last night was pretty good. Pt stated the goal for tonight was to get some rest. Pt stated he was some physical pain today. Pt stated his lower back was in severe pain. Pt rated the pain level in his lower back a 10 on the pain level scale. Pt admitted to dealing with some  auditory and visual hallucinations issues this afternoon. Pt nurse was updated on situation. Pt denies thoughts of harming himself or others. Pt stated he would alert staff if anything changed.  Felipa Furnace 02/22/2021, 10:34 PM

## 2021-02-22 NOTE — Plan of Care (Signed)
  Problem: Education: Goal: Ability to state activities that reduce stress will improve Outcome: Progressing   Problem: Coping: Goal: Ability to identify and develop effective coping behavior will improve Outcome: Progressing   Problem: Self-Concept: Goal: Ability to identify factors that promote anxiety will improve Outcome: Progressing   

## 2021-02-22 NOTE — Progress Notes (Signed)
Recreation Therapy Notes  Date: 02/22/2021 Time: 10:00a Location: [address] Dayroom                                                  Group Topic/Focus: Emotional Expression    Goal Area(s) Addresses:  Patient will be able to identify a variety of emotions.. Patient will successfully share benefits of healthy expression of emotions. Patient will successfully follow instructions on 1st prompt.      Behavioral Response: Active, Engaged   Intervention: Artistic Self-expression   Activity: Emotions in Color. Patient provided a simplistic emotions worksheet, displaying 16 blank squares with varying emotions labeled beside them (for example happiness, sadness, anxiety, love and pride). Patient was asked to identify a color they felt represented each emotion listed. Patient was then provided the 'Emotions Within Me' worksheet, that has a large blank heart on it. Patients were asked to fill in the heart shaped worksheet in the colors identifies on the emotions worksheet to represent the colors within them. Patients were given colored pencils and markers to complete the assignment. Patients were given the opportunity to share their completed assignment with each other. Patients were debriefed on the concept of having accurate words to described their emotions, and knowing what makes them feel a certain way so they can communicate effectively with others.   Education: Tax inspector, Communication, Discharge planning   Education Outcome: Acknowledges understanding  Clinical Observations/Feedback: Pt in good spirits when entering dayroom eager to begin group. Flirtatious with LRT and male patient but, responded positively to verbal redirection. Called out of dayroom to meet with MD returning after consultation. Pt actively began selecting colors to represent emotions although distracted by conversation, at times tangential when contributing to discussion and talking over others. Pt did not  color in the heart template provided electing to write various feelings and words. Pt often ruminated on relationship with father and its impact on their adult life.    Ilsa Iha, LRT/CTRS Benito Mccreedy Melissa Pulido 02/22/2021, 3:49 PM
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
## 2021-02-22 NOTE — Progress Notes (Addendum)
   02/22/21 0500  Sleep  Number of Hours 5.75   EKG  Completed

## 2021-02-22 NOTE — BHH Suicide Risk Assessment (Signed)
[facility] Admission Suicide Risk Assessment   Nursing information obtained from:  Patient Demographic factors:  Living alone,Caucasian,Unemployed,Low socioeconomic status Current Mental Status:  Thoughts of violence towards others Loss Factors:  Decline in physical health Historical Factors:  Impulsivity Risk Reduction Factors:  Positive social support,Positive coping skills or problem solving skills,Positive therapeutic relationship  Total Time spent with patient: 1 hour Principal Problem: Bipolar 1 disorder, mixed, severe (HCC) Diagnosis:  Principal Problem:   Bipolar 1 disorder, mixed, severe (HCC) Active Problems:   HTN (hypertension)  Subjective Data: Patient is a 65 year old male who presented to the [facility] agitated and stating that he was going to make an extra criteria in [place] patient was agitated threatening staff, reporting both auditory and visual hallucinations  Today on presentation, patient reports that he had not slept, was using alcohol, was struggling with his mood, has been a spending a lot of time on the Internet, and has not taken his medications as prescribed.  Patient reports in the past he was seeing Dr. Milagros Evener, has not seen her for many years, had a lot of substance use and now is trying to get his life back on track.  For details please see H&P  Continued Clinical Symptoms:  Alcohol Use Disorder Identification Test Final Score (AUDIT): 32 The "Alcohol Use Disorders Identification Test", Guidelines for Use in Primary Care, Second Edition.  World Science writer [facility]). Score between 0-7:  no or low risk or alcohol related problems. Score between 8-15:  moderate risk of alcohol related problems. Score between 16-19:  high risk of alcohol related problems. Score 20 or above:  warrants further diagnostic evaluation for alcohol dependence and treatment.   CLINICAL FACTORS:   Bipolar Disorder:   Mixed State Alcohol/Substance Abuse/Dependencies More than  one psychiatric diagnosis Unstable or Poor Therapeutic Relationship Previous Psychiatric Diagnoses and Treatments   Musculoskeletal: Strength & Muscle Tone: within normal limits Gait & Station: normal Patient leans: N/A  Psychiatric Specialty Exam: Physical Exam  Review of Systems  Blood pressure (!) 144/93, pulse (!) 102, temperature 97.9 F (36.6 C), temperature source Oral, resp. rate 20, height 5' 6.14" (1.68 m), weight 85.3 kg, SpO2 99 %.Body mass index is 30.21 kg/m.  General Appearance: Casual  Eye Contact:  Fair  Speech:  Clear and Coherent and Normal Rate  Volume:  Normal  Mood:  Depressed, Dysphoric and Irritable  Affect:  Congruent and Labile  Thought Process:  Linear and Descriptions of Associations: Circumstantial  Orientation:  Full (Time, Place, and Person)  Thought Content:  Illogical, Hallucinations: None and Rumination  Suicidal Thoughts:  No  Homicidal Thoughts:  Yes.  without intent/plan  Memory:  Immediate;   Fair Recent;   Fair Remote;   Fair  Judgement:  Impaired  Insight:  Shallow  Psychomotor Activity:  Increased and Mannerisms  Concentration:  Concentration: Fair and Attention Span: Fair  Recall:  [employer] of Knowledge:  Fair  Language:  Fair  Akathisia:  No  Handed:  Right  AIMS (if indicated):     Assets:  Financial Resources/Insurance Housing Social Support  ADL's:  Impaired  Cognition:  WNL  Sleep:  Number of Hours: 5.75      COGNITIVE FEATURES THAT CONTRIBUTE TO RISK:  Loss of executive function    SUICIDE RISK:   Mild:  Suicidal ideation of limited frequency, intensity, duration, and specificity.  There are no identifiable plans, no associated intent, mild dysphoria and related symptoms, good self-control (both objective and subjective assessment),  few other risk factors, and identifiable protective factors, including available and accessible social support.  PLAN OF CARE: While here patient will undergo cognitive behavioral  therapy, c substance abuse education and communication skills training.  Also patient has a history of noncompliance with medication, needs to be able to identify his triggers and safely and effectively participate in outpatient treatment on discharge  I certify that inpatient services furnished can reasonably be expected to improve the patient's condition.   Nelly Rout, MD 02/22/2021, 3:33 PM

## 2021-02-22 NOTE — BHH Counselor (Signed)
CSW attempted to complete PSA with this patient, however patient was sleeping and could not be woken.    Ruthann Cancer MSW, LCSW Clincal Social Worker  [facility]

## 2021-02-22 NOTE — Progress Notes (Signed)
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 Denies  Danger to Others  Danger to Others None reported or observed

## 2021-02-22 NOTE — Progress Notes (Signed)
   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 Denies  Danger to Others  Danger to Others None reported or observed

## 2021-02-23 DIAGNOSIS — F3163 Bipolar disorder, current episode mixed, severe, without psychotic features: Secondary | ICD-10-CM | POA: Diagnosis not present

## 2021-02-23 MED ORDER — CLONIDINE HCL 0.1 MG PO TABS: 0.1000 mg | ORAL_TABLET | ORAL | Status: DC

## 2021-02-23 MED ORDER — CLONIDINE HCL 0.1 MG PO TABS: 0.1000 mg | ORAL_TABLET | Freq: Once | ORAL | Status: DC

## 2021-02-23 MED ORDER — OXCARBAZEPINE 150 MG PO TABS: 150.0000 mg | ORAL_TABLET | Freq: Two times a day (BID) | ORAL | 0 refills | Status: DC

## 2021-02-23 MED ORDER — OLANZAPINE 10 MG PO TBDP
20.0000 mg | ORAL_TABLET | Freq: Every day | ORAL | Status: DC
  Filled 2021-02-23: qty 2

## 2021-02-23 MED ORDER — AMLODIPINE BESYLATE 10 MG PO TABS
10.0000 mg | ORAL_TABLET | Freq: Every day | ORAL | 0 refills | Status: DC
Start: 2021-02-23 — End: 2022-09-18

## 2021-02-23 MED ORDER — NICOTINE 21 MG/24HR TD PT24
21.0000 mg | MEDICATED_PATCH | Freq: Every day | TRANSDERMAL | Status: DC
  Administered 2021-02-23: 21 mg via TRANSDERMAL
  Filled 2021-02-23 (×2): qty 1

## 2021-02-23 MED ORDER — GABAPENTIN 100 MG PO CAPS: 100.0000 mg | ORAL_CAPSULE | Freq: Three times a day (TID) | ORAL | 0 refills | Status: DC

## 2021-02-23 MED ORDER — CLONIDINE HCL 0.1 MG PO TABS
0.1000 mg | ORAL_TABLET | Freq: Once | ORAL | Status: AC
  Administered 2021-02-23: 0.1 mg via ORAL
  Filled 2021-02-23: qty 1

## 2021-02-23 MED ORDER — OLANZAPINE 10 MG PO TBDP
10.0000 mg | ORAL_TABLET | Freq: Three times a day (TID) | ORAL | 0 refills | Status: DC | PRN
Start: 2021-02-23 — End: 2021-04-18

## 2021-02-23 MED ORDER — OLANZAPINE 20 MG PO TBDP
20.0000 mg | ORAL_TABLET | Freq: Every day | ORAL | 0 refills | Status: DC
Start: 2021-02-23 — End: 2021-04-18

## 2021-02-23 NOTE — BHH Suicide Risk Assessment (Signed)
[facility] Discharge Suicide Risk Assessment   Principal Problem: Bipolar 1 disorder, mixed, severe (HCC) Discharge Diagnoses: Principal Problem:   Bipolar 1 disorder, mixed, severe (HCC) Active Problems:   HTN (hypertension)   Total Time spent with patient: 15 minutes  Musculoskeletal: Strength & Muscle Tone: within normal limits Gait & Station: normal Patient leans: N/A  Psychiatric Specialty Exam: Review of Systems  All other systems reviewed and are negative.   Blood pressure (!) 154/95, pulse (!) 103, temperature (!) 97.3 F (36.3 C), temperature source Oral, resp. rate 18, height 5' 6.14" (1.68 m), weight 85.3 kg, SpO2 98 %.Body mass index is 30.21 kg/m.  General Appearance: Casual  Eye Contact::  Good  Speech:  Normal Rate409  Volume:  Normal  Mood:  Euthymic  Affect:  Congruent  Thought Process:  Coherent and Descriptions of Associations: Circumstantial  Orientation:  Full (Time, Place, and Person)  Thought Content:  Logical  Suicidal Thoughts:  No  Homicidal Thoughts:  No  Memory:  Immediate;   Fair Recent;   Fair Remote;   Fair  Judgement:  Intact  Insight:  Lacking  Psychomotor Activity:  Normal  Concentration:  Good  Recall:  Fair  Fund of Knowledge:Fair  Language: Good  Akathisia:  Negative  Handed:  Right  AIMS (if indicated):     Assets:  Desire for Improvement Resilience  Sleep:  Number of Hours: 1.75  Cognition: WNL  ADL's:  Intact   Mental Status Per Nursing Assessment::   On Admission:  Thoughts of violence towards others  Demographic Factors:  Male, Caucasian, Low socioeconomic status, Living alone and Unemployed  Loss Factors: Financial problems/change in socioeconomic status  Historical Factors: NA  Risk Reduction Factors:   NA  Continued Clinical Symptoms:  Depression:   Impulsivity Insomnia  Cognitive Features That Contribute To Risk:  Thought constriction (tunnel vision)    Suicide Risk:  Minimal: No identifiable suicidal  ideation.  Patients presenting with no risk factors but with morbid ruminations; may be classified as minimal risk based on the severity of the depressive symptoms    Plan Of Care/Follow-up recommendations:  Activity:  ad lib  Antonieta Pert, MD 02/23/2021, 9:37 AM

## 2021-02-23 NOTE — BHH Group Notes (Signed)
The focus of this group is to help patients establish daily goals to achieve during treatment and discuss how the patient can incorporate goal setting into their daily lives to aide in recovery.      Pt participated in group, pt discussed his goals and excitement about leaving today.

## 2021-02-23 NOTE — Progress Notes (Signed)
  [facility] Adult Case Management Discharge Plan :  Will you be returning to the same living situation after discharge:  Yes,  to home At discharge, do you have transportation home?: No. Safe Transport to be arranged. Do you have the ability to pay for your medications: Yes,  has insurance  Release of information consent forms completed and in the chart;  Patient's signature needed at discharge.  Patient to Follow up at:  Follow-up Information    [facility] Go on 03/18/2021.   Why: You have an appointment for medication management on 03/18/21 at 11:00 am.  This appointment will be held in person.  Contact information: [address] [place] [address] [phone]               Next level of care provider has access to [facility] Link:no  Safety Planning and Suicide Prevention discussed: Yes,  with patient  Have you used any form of tobacco in the last 30 days? (Cigarettes, Smokeless Tobacco, Cigars, and/or Pipes): Yes  Has patient been referred to the Quitline?: Patient refused referral  Patient has been referred for addiction treatment: Pt. refused referral  Otelia Santee, LCSW 02/23/2021, 11:00 AM

## 2021-02-23 NOTE — Progress Notes (Signed)
D: Pt A & O X 3. Presents anxious, animated and hyperactive but cooperative with care. Denies SI, HI, AVH and pain at this time. D/C home as ordered. Picked up in front of facility by [employer]. A: D/C instructions reviewed with pt including prescriptions,  and follow up appointment; compliance encouraged. All belongings from locker 40 given to pt at time of departure. Scheduled and PRN medications given with verbal education and effects monitored. Safety checks maintained without incident till time of d/c.  R: Pt receptive to care. Compliant with medications when offered. Denies adverse drug reactions when assessed. Verbalized understanding related to d/c instructions. Signed belonging sheet in agreement with items received from locker. Ambulatory with a steady gait. Appears to be in no physical distress at time of departure.

## 2021-02-23 NOTE — Progress Notes (Signed)
   02/23/21 0530  Sleep  Number of Hours 1.75  CIWA-Ar  Nausea and Vomiting 0  Tactile Disturbances 0  Tremor 0  Auditory Disturbances 0  Paroxysmal Sweats 0  Visual Disturbances 0  Anxiety 2  Headache, Fullness in Head 0  Agitation 0  Orientation and Clouding of Sensorium 0  CIWA-Ar Total 2

## 2021-02-23 NOTE — Progress Notes (Signed)
Recreation Therapy Notes  Date: 02/23/2021 Time: 1005a Location: [address] Dayroom  Group Topic: Leisure Education   Goal Area(s) Addresses:  Patient will successfully identify positive leisure and recreation activities.  Patient will acknowlege benefits of participation in healthy leisure activities post discharge.  Patient will actively work with peers toward a shared goal.   Behavioral Response: Active   Intervention: Competitive Group Game   Activity: Leisure Facilities manager. In teams of 2, patients were asked to create a list of leisure activities to correspond with a letter of the alphabet selected by LRT. Time limit of 1 minute and 30 seconds per round. Points were awarded for each unique answer identified by a team. After several rounds of game play, using different letters, the team with the most points were declared winners.    Education:  Leisure Education, Pharmacologist, Discharge Planning  Education Outcome: Acknowledges education  Clinical Observations/Feedback: Pt required coaching to maintain appropriate interactions during competitive game play. Pt intrinsically motivated to win but, ultimately congratulated opposing team with fist bumps acknowledging their creativity and effort. Pt worked cooperatively with Engineer, manufacturing as Retail buyer. Pt thanked LRT for session at conclusion of group and reported that they had more "stuff to try" after they leave today, referring to recreation pursuits.   Ilsa Iha, LRT/CTRS Benito Mccreedy Morgen Linebaugh 02/23/2021, 2:47 PM
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
## 2021-02-23 NOTE — Tx Team (Addendum)
Interdisciplinary Treatment and Diagnostic Plan Update  02/23/2021 Time of Session: 9:45am Arkeem Harts MRN: [id number]  Principal Diagnosis: Bipolar 1 disorder, mixed, severe (HCC)  Secondary Diagnoses: Principal Problem:   Bipolar 1 disorder, mixed, severe (HCC) Active Problems:   HTN (hypertension)   Current Medications:  Current Facility-Administered Medications  Medication Dose Route Frequency Provider Last Rate Last Admin  . acetaminophen (TYLENOL) tablet 650 mg  650 mg Oral Q6H PRN Antonieta Pert, MD   650 mg at 02/23/21 0753  . alum & mag hydroxide-simeth (MAALOX/MYLANTA) 200-200-20 MG/5ML suspension 30 mL  30 mL Oral Q4H PRN Antonieta Pert, MD      . amLODipine (NORVASC) tablet 10 mg  10 mg Oral Daily Antonieta Pert, MD   10 mg at 02/23/21 0749  . cloNIDine (CATAPRES) tablet 0.1 mg  0.1 mg Oral TID PRN Antonieta Pert, MD   0.1 mg at 02/23/21 1118  . gabapentin (NEURONTIN) capsule 100 mg  100 mg Oral TID Antonieta Pert, MD   100 mg at 02/23/21 1205  . hydrOXYzine (ATARAX/VISTARIL) tablet 25 mg  25 mg Oral Q6H PRN Antonieta Pert, MD   25 mg at 02/23/21 1119  . ibuprofen (ADVIL) tablet 400 mg  400 mg Oral Q6H PRN Laveda Abbe, NP   400 mg at 02/22/21 1317  . loperamide (IMODIUM) capsule 2-4 mg  2-4 mg Oral PRN Antonieta Pert, MD      . LORazepam (ATIVAN) tablet 1 mg  1 mg Oral Q6H PRN Antonieta Pert, MD   1 mg at 02/22/21 0750   Or  . LORazepam (ATIVAN) injection 1 mg  1 mg Intramuscular Q6H PRN Antonieta Pert, MD      . magnesium hydroxide (MILK OF MAGNESIA) suspension 30 mL  30 mL Oral Daily PRN Antonieta Pert, MD      . methocarbamol (ROBAXIN) tablet 500 mg  500 mg Oral Q8H PRN Laveda Abbe, NP   500 mg at 02/22/21 1317  . multivitamin with minerals tablet 1 tablet  1 tablet Oral Daily Antonieta Pert, MD   1 tablet at 02/23/21 0749  . nicotine (NICODERM CQ - dosed in mg/24 hours) patch 21 mg  21 mg Transdermal Daily  Antonieta Pert, MD   21 mg at 02/23/21 4696  . OLANZapine zydis (ZYPREXA) disintegrating tablet 10 mg  10 mg Oral TID PRN Antonieta Pert, MD      . OLANZapine zydis [facility]) disintegrating tablet 20 mg  20 mg Oral QHS Antonieta Pert, MD      . ondansetron (ZOFRAN-ODT) disintegrating tablet 4 mg  4 mg Oral Q6H PRN Antonieta Pert, MD      . OXcarbazepine (TRILEPTAL) tablet 150 mg  150 mg Oral BID Antonieta Pert, MD   150 mg at 02/23/21 0749  . thiamine tablet 100 mg  100 mg Oral Daily Antonieta Pert, MD   100 mg at 02/23/21 0749  . traZODone (DESYREL) tablet 50 mg  50 mg Oral QHS PRN Antonieta Pert, MD   50 mg at 02/22/21 2104  . ziprasidone (GEODON) injection 20 mg  20 mg Intramuscular Q6H PRN Antonieta Pert, MD       Current Outpatient Medications  Medication Sig Dispense Refill  . amLODipine (NORVASC) 10 MG tablet Take 1 tablet (10 mg total) by mouth daily. 30 tablet 0  . gabapentin (NEURONTIN) 100 MG capsule Take 1 capsule (100 mg total) by  mouth 3 (three) times daily. 90 capsule 0  . Multiple Vitamin (MULTIVITAMIN WITH MINERALS) TABS tablet Take 1 tablet by mouth daily. (Patient not taking: Reported on 02/19/2021)    . OLANZapine zydis (ZYPREXA) 10 MG disintegrating tablet Take 1 tablet (10 mg total) by mouth 3 (three) times daily as needed (agitation). 30 tablet 0  . OLANZapine zydis (ZYPREXA) 20 MG disintegrating tablet Take 1 tablet (20 mg total) by mouth at bedtime. 30 tablet 0  . OXcarbazepine (TRILEPTAL) 150 MG tablet Take 1 tablet (150 mg total) by mouth 2 (two) times daily. 60 tablet 0  . thiamine 100 MG tablet Take 1 tablet (100 mg total) by mouth daily. (Patient not taking: Reported on 02/19/2021)     PTA Medications: No medications prior to admission.    Patient Stressors: Health problems Substance abuse  Patient Strengths: Ability for insight Active sense of humor Motivation for treatment/growth Supportive family/friends  Treatment  Modalities: Medication Management, Group therapy, Case management,  1 to 1 session with clinician, Psychoeducation, Recreational therapy.   Physician Treatment Plan for Primary Diagnosis: Bipolar 1 disorder, mixed, severe (HCC) Long Term Goal(s): Improvement in symptoms so as ready for discharge Improvement in symptoms so as ready for discharge   Short Term Goals: Ability to verbalize feelings will improve Ability to identify and develop effective coping behaviors will improve Ability to maintain clinical measurements within normal limits will improve Compliance with prescribed medications will improve Ability to identify triggers associated with substance abuse/mental health issues will improve Ability to identify changes in lifestyle to reduce recurrence of condition will improve Ability to verbalize feelings will improve Ability to demonstrate self-control will improve Ability to identify and develop effective coping behaviors will improve Compliance with prescribed medications will improve Ability to identify triggers associated with substance abuse/mental health issues will improve  Medication Management: Evaluate patient's response, side effects, and tolerance of medication regimen.  Therapeutic Interventions: 1 to 1 sessions, Unit Group sessions and Medication administration.  Evaluation of Outcomes: Adequate for Discharge  Physician Treatment Plan for Secondary Diagnosis: Principal Problem:   Bipolar 1 disorder, mixed, severe (HCC) Active Problems:   HTN (hypertension)  Long Term Goal(s): Improvement in symptoms so as ready for discharge Improvement in symptoms so as ready for discharge   Short Term Goals: Ability to verbalize feelings will improve Ability to identify and develop effective coping behaviors will improve Ability to maintain clinical measurements within normal limits will improve Compliance with prescribed medications will improve Ability to identify  triggers associated with substance abuse/mental health issues will improve Ability to identify changes in lifestyle to reduce recurrence of condition will improve Ability to verbalize feelings will improve Ability to demonstrate self-control will improve Ability to identify and develop effective coping behaviors will improve Compliance with prescribed medications will improve Ability to identify triggers associated with substance abuse/mental health issues will improve     Medication Management: Evaluate patient's response, side effects, and tolerance of medication regimen.  Therapeutic Interventions: 1 to 1 sessions, Unit Group sessions and Medication administration.  Evaluation of Outcomes: Adequate for Discharge   RN Treatment Plan for Primary Diagnosis: Bipolar 1 disorder, mixed, severe (HCC) Long Term Goal(s): Knowledge of disease and therapeutic regimen to maintain health will improve  Short Term Goals: Ability to demonstrate self-control, Ability to participate in decision making will improve and Ability to verbalize feelings will improve  Medication Management: RN will administer medications as ordered by provider, will assess and evaluate patient's response and provide education to  patient for prescribed medication. RN will report any adverse and/or side effects to prescribing provider.  Therapeutic Interventions: 1 on 1 counseling sessions, Psychoeducation, Medication administration, Evaluate responses to treatment, Monitor vital signs and CBGs as ordered, Perform/monitor CIWA, COWS, AIMS and Fall Risk screenings as ordered, Perform wound care treatments as ordered.  Evaluation of Outcomes: Adequate for Discharge   LCSW Treatment Plan for Primary Diagnosis: Bipolar 1 disorder, mixed, severe (HCC) Long Term Goal(s): Safe transition to appropriate next level of care at discharge, Engage patient in therapeutic group addressing interpersonal concerns.  Short Term Goals: Engage  patient in aftercare planning with referrals and resources, Increase social support and Increase ability to appropriately verbalize feelings  Therapeutic Interventions: Assess for all discharge needs, 1 to 1 time with Social worker, Explore available resources and support systems, Assess for adequacy in community support network, Educate family and significant other(s) on suicide prevention, Complete Psychosocial Assessment, Interpersonal group therapy.  Evaluation of Outcomes: Adequate for Discharge   Progress in Treatment: Attending groups: Yes. Participating in groups: Yes. Taking medication as prescribed: Yes. Toleration medication: Yes. Family/Significant other contact made: Yes, individual(s) contacted:  Brother Patient understands diagnosis: Yes. Discussing patient identified problems/goals with staff: Yes. Medical problems stabilized or resolved: Yes. Denies suicidal/homicidal ideation: Yes. Issues/concerns per patient self-inventory: No. Other: None  New problem(s) identified: No, Describe:  ready for d/c  New Short Term/Long Term Goal(s):medication stabilization, elimination of SI thoughts, development of comprehensive mental wellness plan.  Patient Goals:  "to get out and keep taking medications."  Discharge Plan or Barriers: Patient recently admitted. CSW will continue to follow and assess for appropriate referrals and possible discharge planning.  Reason for Continuation of Hospitalization: Medication stabilization  Estimated Length of Stay: 3-5 days  Attendees: Patient: Drago Hammonds 02/23/2021   Physician: Landry Mellow, MD 02/23/2021   Nursing:  02/23/2021   RN Care Manager: 02/23/2021   Social Worker: Fredirick Lathe, LCSWA 02/23/2021   Recreational Therapist:  02/23/2021   Other:  02/23/2021   Other:  02/23/2021   Other: 02/23/2021       Scribe for Treatment Team: Felizardo Hoffmann, Theresia Majors 02/23/2021 3:35 PM

## 2021-02-23 NOTE — [facility] Suicide Risk Assessment (Signed)
[facility] INPATIENT:  Family/Significant Other Suicide Prevention Education    Suicide Prevention Education:  Contact Attempts: Bother, Aqil Goetting [phone]), has been identified by the patient as the family member/significant other with whom the patient will be residing, and identified as the person(s) who will aid the patient in the event of a mental health crisis.  With written consent from the patient, two attempts were made to provide suicide prevention education, prior to and/or following the patient's discharge.  We were unsuccessful in providing suicide prevention education.  A suicide education pamphlet was given to the patient to share with family/significant other.   Date and time of first attempt: 02/23/2021 at 10:45am CSW unable to leave a HIPAA compliant voicemail for pt's brother due to number continuing to ring.  Date and time of second attempt: Due to pt being discharged on this date, second attempt unable to be completed.   Unable to reach supports, SPE pamphlet placed on chart for patient to share with supports at discharge.     Ruthann Cancer MSW, LCSW Clincal Social Worker  [facility]
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
## 2021-03-25 ENCOUNTER — Emergency Department (HOSPITAL_COMMUNITY)
Admission: EM | Admit: 2021-03-25 | Discharge: 2021-03-30 | Disposition: A | Discharge status: Home / Self Care | Payer: Medicare Other | Attending: Emergency Medicine | Admitting: Emergency Medicine

## 2021-03-25 ENCOUNTER — Other Ambulatory Visit: Payer: Self-pay

## 2021-03-25 DIAGNOSIS — R4689 Other symptoms and signs involving appearance and behavior: Secondary | ICD-10-CM

## 2021-03-25 DIAGNOSIS — F1220 Cannabis dependence, uncomplicated: Secondary | ICD-10-CM | POA: Diagnosis not present

## 2021-03-25 DIAGNOSIS — F17210 Nicotine dependence, cigarettes, uncomplicated: Secondary | ICD-10-CM | POA: Diagnosis not present

## 2021-03-25 DIAGNOSIS — F12280 Cannabis dependence with cannabis-induced anxiety disorder: Secondary | ICD-10-CM | POA: Insufficient documentation

## 2021-03-25 DIAGNOSIS — F332 Major depressive disorder, recurrent severe without psychotic features: Secondary | ICD-10-CM | POA: Diagnosis present

## 2021-03-25 DIAGNOSIS — F0634 Mood disorder due to known physiological condition with mixed features: Secondary | ICD-10-CM | POA: Insufficient documentation

## 2021-03-25 DIAGNOSIS — F3164 Bipolar disorder, current episode mixed, severe, with psychotic features: Secondary | ICD-10-CM | POA: Diagnosis not present

## 2021-03-25 DIAGNOSIS — R45851 Suicidal ideations: Secondary | ICD-10-CM

## 2021-03-25 DIAGNOSIS — I10 Essential (primary) hypertension: Secondary | ICD-10-CM | POA: Insufficient documentation

## 2021-03-25 DIAGNOSIS — Z20822 Contact with and (suspected) exposure to covid-19: Secondary | ICD-10-CM | POA: Insufficient documentation

## 2021-03-25 DIAGNOSIS — F1420 Cocaine dependence, uncomplicated: Secondary | ICD-10-CM | POA: Diagnosis present

## 2021-03-25 DIAGNOSIS — F250 Schizoaffective disorder, bipolar type: Secondary | ICD-10-CM | POA: Diagnosis present

## 2021-03-25 DIAGNOSIS — R456 Violent behavior: Secondary | ICD-10-CM | POA: Diagnosis present

## 2021-03-25 DIAGNOSIS — F1010 Alcohol abuse, uncomplicated: Secondary | ICD-10-CM | POA: Diagnosis present

## 2021-03-25 DIAGNOSIS — F1410 Cocaine abuse, uncomplicated: Secondary | ICD-10-CM

## 2021-03-25 DIAGNOSIS — F1994 Other psychoactive substance use, unspecified with psychoactive substance-induced mood disorder: Secondary | ICD-10-CM

## 2021-03-25 LAB — CBC WITH DIFFERENTIAL/PLATELET
Abs Immature Granulocytes: 0.11 10*3/uL — ABNORMAL HIGH (ref 0.00–0.07)
Basophils Absolute: 0.1 10*3/uL (ref 0.0–0.1)
Basophils Relative: 1 %
Eosinophils Absolute: 0.3 10*3/uL (ref 0.0–0.5)
Eosinophils Relative: 2 %
HCT: 44.7 % (ref 39.0–52.0)
Hemoglobin: 14.7 g/dL (ref 13.0–17.0)
Immature Granulocytes: 1 %
Lymphocytes Relative: 14 %
Lymphs Abs: 2 10*3/uL (ref 0.7–4.0)
MCH: 32.1 pg (ref 26.0–34.0)
MCHC: 32.9 g/dL (ref 30.0–36.0)
MCV: 97.6 fL (ref 80.0–100.0)
Monocytes Absolute: 1.3 10*3/uL — ABNORMAL HIGH (ref 0.1–1.0)
Monocytes Relative: 9 %
Neutro Abs: 10.5 10*3/uL — ABNORMAL HIGH (ref 1.7–7.7)
Neutrophils Relative %: 73 %
Platelets: 406 10*3/uL — ABNORMAL HIGH (ref 150–400)
RBC: 4.58 MIL/uL (ref 4.22–5.81)
RDW: 13.8 % (ref 11.5–15.5)
WBC: 14.2 10*3/uL — ABNORMAL HIGH (ref 4.0–10.5)
nRBC: 0 % (ref 0.0–0.2)

## 2021-03-25 LAB — RESP PANEL BY RT-PCR (FLU A&B, COVID) ARPGX2
Influenza A by PCR: NEGATIVE
Influenza B by PCR: NEGATIVE
SARS Coronavirus 2 by RT PCR: NEGATIVE

## 2021-03-25 LAB — COMPREHENSIVE METABOLIC PANEL
ALT: 28 U/L (ref 0–44)
AST: 27 U/L (ref 15–41)
Albumin: 3.9 g/dL (ref 3.5–5.0)
Alkaline Phosphatase: 118 U/L (ref 38–126)
Anion gap: 8 (ref 5–15)
BUN: 14 mg/dL (ref 8–23)
CO2: 25 mmol/L (ref 22–32)
Calcium: 9.1 mg/dL (ref 8.9–10.3)
Chloride: 103 mmol/L (ref 98–111)
Creatinine, Ser: 1.17 mg/dL (ref 0.61–1.24)
GFR, Estimated: >60 mL/min (ref >60)
Glucose, Bld: 91 mg/dL (ref 70–99)
Potassium: 3.9 mmol/L (ref 3.5–5.1)
Sodium: 136 mmol/L (ref 135–145)
Total Bilirubin: 1 mg/dL (ref 0.3–1.2)
Total Protein: 6.7 g/dL (ref 6.5–8.1)

## 2021-03-25 LAB — ACETAMINOPHEN LEVEL: Acetaminophen (Tylenol), Serum: <10 ug/mL — ABNORMAL LOW (ref 10–30)

## 2021-03-25 LAB — ETHANOL: Alcohol, Ethyl (B): <10 mg/dL (ref <10)

## 2021-03-25 LAB — SALICYLATE LEVEL: Salicylate Lvl: <7 mg/dL — ABNORMAL LOW (ref 7.0–30.0)

## 2021-03-25 MED ORDER — DIPHENHYDRAMINE HCL 50 MG/ML IJ SOLN
50.0000 mg | Freq: Once | INTRAMUSCULAR | Status: DC
  Filled 2021-03-25: qty 1

## 2021-03-25 MED ORDER — IBUPROFEN 400 MG PO TABS
600.0000 mg | ORAL_TABLET | Freq: Three times a day (TID) | ORAL | Status: DC | PRN
  Administered 2021-03-25 – 2021-03-30 (×5): 600 mg via ORAL
  Filled 2021-03-25 (×5): qty 1

## 2021-03-25 MED ORDER — LORAZEPAM 2 MG/ML IJ SOLN
2.0000 mg | Freq: Once | INTRAMUSCULAR | Status: DC
  Filled 2021-03-25: qty 1

## 2021-03-25 MED ORDER — ALUM & MAG HYDROXIDE-SIMETH 200-200-20 MG/5ML PO SUSP: 30.0000 mL | Freq: Four times a day (QID) | ORAL | Status: DC | PRN

## 2021-03-25 MED ORDER — DIPHENHYDRAMINE HCL 50 MG/ML IJ SOLN
50.0000 mg | Freq: Once | INTRAMUSCULAR | Status: AC
  Administered 2021-03-25: 50 mg via INTRAVENOUS

## 2021-03-25 MED ORDER — NICOTINE 21 MG/24HR TD PT24
21.0000 mg | MEDICATED_PATCH | Freq: Every day | TRANSDERMAL | Status: DC | PRN
  Administered 2021-03-25 – 2021-03-28 (×2): 21 mg via TRANSDERMAL
  Filled 2021-03-25 (×2): qty 1

## 2021-03-25 MED ORDER — HALOPERIDOL LACTATE 5 MG/ML IJ SOLN: 5.0000 mg | Freq: Once | INTRAMUSCULAR | Status: DC

## 2021-03-25 MED ORDER — ZIPRASIDONE MESYLATE 20 MG IM SOLR
20.0000 mg | Freq: Once | INTRAMUSCULAR | Status: DC
  Filled 2021-03-25: qty 20

## 2021-03-25 MED ORDER — ZIPRASIDONE MESYLATE 20 MG IM SOLR
20.0000 mg | Freq: Once | INTRAMUSCULAR | Status: AC
  Administered 2021-03-25: 20 mg via INTRAMUSCULAR

## 2021-03-25 MED ORDER — LORAZEPAM 2 MG/ML IJ SOLN
2.0000 mg | Freq: Once | INTRAMUSCULAR | Status: AC
  Administered 2021-03-25: 2 mg via INTRAVENOUS

## 2021-03-25 NOTE — ED Notes (Signed)
Dr. Jacqulyn Bath made aware of IV started. Gave verbal order to push IV

## 2021-03-25 NOTE — ED Notes (Signed)
Pt reports back pain.  

## 2021-03-25 NOTE — ED Notes (Signed)
Pt refused Im medication. Pt states this RN can start IV

## 2021-03-25 NOTE — ED Notes (Signed)
Pt changed into appropriate clothes for IVC. Pt wanded and belongings wanded. Belongings inventoried.

## 2021-03-25 NOTE — ED Notes (Signed)
Pt pulled off all monitoring devices. Will attempt to replace after pt eats dinner.

## 2021-03-25 NOTE — ED Notes (Signed)
Pt sleeping. 

## 2021-03-25 NOTE — ED Notes (Signed)
Pt verbally aggressive to staff. Cursing and yelling. Security at bedside.
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
## 2021-03-25 NOTE — ED Notes (Signed)
Pt continues to sleep. Sitters switching out.

## 2021-03-25 NOTE — ED Notes (Signed)
Pt showering. Calm and cooperative. Sitter present.

## 2021-03-25 NOTE — ED Notes (Signed)
Pt sitting up . Alert and eating dinner tray.

## 2021-03-25 NOTE — BH Assessment (Addendum)
  Tressia Miners, PA-C recommends inpatient psychiatric treatment. [facility] at capacity, per [facility] Randa Evens, RN). Other facilities will be contacted for placement as advised by Dr. Lucianne Muss earlier today.       Details [facility]        [facility] Details       [facility] Details       [facility] [facility] Details       [facility] Details       [facility] Details       [facility] Details       [facility] Details       [facility] Details       [facility] Details       [facility] Details       [facility] Details       [facility] Details       [facility] Details       [facility] Details       [facility] Details       [facility] Details       [facility] Details       [facility] Details       [facility] Details       [facility] Details       [facility] Details       [facility] Details       [facility] Details       [facility] Details       [facility] Details       [facility]

## 2021-03-25 NOTE — ED Notes (Signed)
Pt stood up got out of bed and urinated over himself, floor and bed. Pt cleaned up and placed back in bed. Pt was calm and cooperative at this time.

## 2021-03-25 NOTE — ED Notes (Signed)
Dr Long at bedside
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
## 2021-03-25 NOTE — ED Notes (Signed)
TTS in progress 
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
## 2021-03-26 LAB — RAPID URINE DRUG SCREEN, HOSP PERFORMED
Amphetamines: NOT DETECTED
Barbiturates: NOT DETECTED
Benzodiazepines: NOT DETECTED
Cocaine: POSITIVE — AB
Opiates: NOT DETECTED
Tetrahydrocannabinol: POSITIVE — AB

## 2021-03-26 MED ORDER — ZIPRASIDONE MESYLATE 20 MG IM SOLR
20.0000 mg | Freq: Two times a day (BID) | INTRAMUSCULAR | Status: AC | PRN
  Administered 2021-03-26 – 2021-03-27 (×2): 20 mg via INTRAMUSCULAR
  Filled 2021-03-26 (×2): qty 20

## 2021-03-26 MED ORDER — OLANZAPINE 5 MG PO TBDP
10.0000 mg | ORAL_TABLET | Freq: Three times a day (TID) | ORAL | Status: DC | PRN
  Administered 2021-03-26: 10 mg via ORAL
  Filled 2021-03-26: qty 2

## 2021-03-26 MED ORDER — DIPHENHYDRAMINE HCL 50 MG/ML IJ SOLN
50.0000 mg | Freq: Two times a day (BID) | INTRAMUSCULAR | Status: AC | PRN
  Administered 2021-03-26: 50 mg via INTRAMUSCULAR
  Filled 2021-03-26: qty 1

## 2021-03-26 MED ORDER — AMLODIPINE BESYLATE 5 MG PO TABS
10.0000 mg | ORAL_TABLET | Freq: Every day | ORAL | Status: DC
  Administered 2021-03-26 – 2021-03-30 (×5): 10 mg via ORAL
  Filled 2021-03-26 (×5): qty 2

## 2021-03-26 MED ORDER — ZIPRASIDONE MESYLATE 20 MG IM SOLR: 20.0000 mg | Freq: Once | INTRAMUSCULAR | Status: DC

## 2021-03-26 MED ORDER — OXCARBAZEPINE 150 MG PO TABS
150.0000 mg | ORAL_TABLET | Freq: Two times a day (BID) | ORAL | Status: DC
  Administered 2021-03-26 – 2021-03-29 (×8): 150 mg via ORAL
  Filled 2021-03-26 (×12): qty 1

## 2021-03-26 MED ORDER — LORAZEPAM 2 MG/ML IJ SOLN: 1.0000 mg | Freq: Once | INTRAMUSCULAR | Status: DC

## 2021-03-26 MED ORDER — THIAMINE HCL 100 MG PO TABS
100.0000 mg | ORAL_TABLET | Freq: Every day | ORAL | Status: DC
  Administered 2021-03-26 – 2021-03-30 (×5): 100 mg via ORAL
  Filled 2021-03-26 (×5): qty 1

## 2021-03-26 MED ORDER — OLANZAPINE 5 MG PO TBDP: 20.0000 mg | ORAL_TABLET | Freq: Every day | ORAL | Status: DC | PRN

## 2021-03-26 MED ORDER — STERILE WATER FOR INJECTION IJ SOLN
INTRAMUSCULAR | Status: AC
  Filled 2021-03-26: qty 10

## 2021-03-26 MED ORDER — LORAZEPAM 2 MG/ML IJ SOLN
INTRAMUSCULAR | Status: AC
  Administered 2021-03-26: 1 mg via INTRAMUSCULAR
  Filled 2021-03-26: qty 1

## 2021-03-26 MED ORDER — OLANZAPINE 5 MG PO TBDP
30.0000 mg | ORAL_TABLET | Freq: Every day | ORAL | Status: DC
  Administered 2021-03-26 – 2021-03-29 (×4): 30 mg via ORAL
  Filled 2021-03-26 (×4): qty 6

## 2021-03-26 MED ORDER — GABAPENTIN 100 MG PO CAPS
100.0000 mg | ORAL_CAPSULE | Freq: Three times a day (TID) | ORAL | Status: DC
  Administered 2021-03-26 (×3): 100 mg via ORAL
  Filled 2021-03-26 (×3): qty 1

## 2021-03-26 MED ORDER — LORAZEPAM 2 MG/ML IJ SOLN: 1.0000 mg | Freq: Once | INTRAMUSCULAR | Status: AC

## 2021-03-26 NOTE — Progress Notes (Signed)
Per Ophelia Shoulder, NP, patient continues to meets criteria for inpatient treatment. There are no available or appropriate beds at [facility] today. CSW re-faxed referrals to the following facilities for review:  [facility]  TTS will continue to seek bed placement.  Crissie Reese, MSW, LCSW-A, LCAS-A Phone: [phone] Disposition/TOC

## 2021-03-26 NOTE — ED Notes (Signed)
Pt used phone for the second time today

## 2021-03-26 NOTE — ED Notes (Signed)
Pt awake and pacing around unit, states he needs to get some exercise.  Given juice and snacks, upset with staff because he was told he could only have water till breakfast arrived after this snack.

## 2021-03-26 NOTE — ED Provider Notes (Signed)
Emergency Medicine Observation Re-evaluation Note  Stephen Delacruz is a 65 y.o. male, seen on rounds today.  Pt initially presented to the ED for complaints of Fall Currently, the patient is awaiting inpatient psych placement. He is sitting up on the side of his bed in NAD.   Physical Exam  BP 138/72 (BP Location: Right Arm)   Pulse (!) 102   Temp 98.7 F (37.1 C) (Oral)   Resp 18   Ht 5\' 8"  (1.727 m)   Wt 77.1 kg   SpO2 98%   BMI 25.85 kg/m  Physical Exam General: nad Cardiac: slightly tachycardic Lungs: respirations even and unladored Psych: delusions of grandiosity, flight of ideas  ED Course / MDM  EKG:EKG Interpretation  Date/Time:  Friday March 25 2021 08:48:02 EDT Ventricular Rate:  108 PR Interval:  182 QRS Duration: 88 QT Interval:  333 QTC Calculation: 447 R Axis:   106 Text Interpretation: Sinus tachycardia Consider right atrial enlargement Right axis deviation Confirmed by 02-14-1973 [phone]) on 03/25/2021 9:08:27 AM   I have reviewed the labs performed to date as well as medications administered while in observation.  Recent changes in the last 24 hours include no acute changes.  Plan  Current plan is for inpatient psych treatment . Patient is under full IVC at this time.   05/25/2021 03/26/21 1016    05/26/21, MD 03/26/21 2115

## 2021-03-26 NOTE — ED Notes (Signed)
I have been medicating pt to get him calm so we can remove the dog tags from around his neck. Still waiting on pt to get more relaxed.

## 2021-03-26 NOTE — BH Assessment (Signed)
Patient was seen this date by TTS to assess current mental health status. Patient continues to be agitated and refuses to answer any of this writer's questions in reference to S/I or H/I. Patient states, "I guess you don't know who I am you piece of s#it." Patient ruminates on a maltese cross he is wearing on a chain which he refuses to turn over to security speaking at length about how his "forefathers were rulers of the world." Case was staffed with Arvilla Market NP who recommends a continued inpatient admission as appropriate bed placement is investigated.

## 2021-03-26 NOTE — ED Notes (Signed)
Pt provided with hygiene items and had shower.

## 2021-03-26 NOTE — ED Notes (Signed)
Pt used phone to call family

## 2021-03-26 NOTE — ED Notes (Signed)
Pt laying in bed with eyes closed. Respirations are even and non-labored

## 2021-03-26 NOTE — ED Notes (Signed)
I noticed pt still had his dog tags on and when I told him he needed to take them off, he has refused and started to yell and curse and get verbally abusive and threat of violence. I called for security.

## 2021-03-27 MED ORDER — GABAPENTIN 100 MG PO CAPS
200.0000 mg | ORAL_CAPSULE | Freq: Three times a day (TID) | ORAL | Status: DC
  Administered 2021-03-27 – 2021-03-30 (×11): 200 mg via ORAL
  Filled 2021-03-27 (×11): qty 2

## 2021-03-27 MED ORDER — STERILE WATER FOR INJECTION IJ SOLN
INTRAMUSCULAR | Status: AC
  Filled 2021-03-27: qty 10

## 2021-03-27 NOTE — ED Notes (Signed)
Pt being verbally abusive to staff and slamming doors. Verbal de-escalation methods were not successful. Geodon preparing to be given.

## 2021-03-27 NOTE — ED Notes (Signed)
Pt lunch tray delivered.

## 2021-03-27 NOTE — ED Notes (Signed)
Pt provided graham crackers and juice as a snack

## 2021-03-27 NOTE — ED Notes (Signed)
Pt provided hygiene items per his request, pt is now taking a shower. Will continue to monitor, sitter present
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
## 2021-03-27 NOTE — ED Notes (Signed)
Pt urinated on floor. RN and NT cleaned pt's floor up. Educated pt on notifying us when he needed to use the bathroom and urinal provided.

## 2021-03-27 NOTE — Progress Notes (Signed)
Per Shnese Mills, NP, patient continues to meets criteria for inpatient treatment. There are no available or appropriate beds at [facility] today. CSW re-faxed referrals to the following facilities for review:  [facility]  TTS will continue to seek bed placement.  Myrtle Haller, MSW, LCSW-A, LCAS-A Phone: [phone] Disposition/TOC  

## 2021-03-27 NOTE — ED Notes (Signed)
Pt took shower °

## 2021-03-27 NOTE — ED Notes (Signed)
Tele psych machine to bedside  

## 2021-03-28 ENCOUNTER — Encounter (HOSPITAL_COMMUNITY): Payer: Self-pay | Admitting: Registered Nurse

## 2021-03-28 DIAGNOSIS — F1420 Cocaine dependence, uncomplicated: Secondary | ICD-10-CM

## 2021-03-28 DIAGNOSIS — F1220 Cannabis dependence, uncomplicated: Secondary | ICD-10-CM

## 2021-03-28 DIAGNOSIS — F1010 Alcohol abuse, uncomplicated: Secondary | ICD-10-CM

## 2021-03-28 DIAGNOSIS — R4689 Other symptoms and signs involving appearance and behavior: Secondary | ICD-10-CM | POA: Insufficient documentation

## 2021-03-28 DIAGNOSIS — F3164 Bipolar disorder, current episode mixed, severe, with psychotic features: Secondary | ICD-10-CM

## 2021-03-28 MED ORDER — ZIPRASIDONE MESYLATE 20 MG IM SOLR: 20.0000 mg | Freq: Two times a day (BID) | INTRAMUSCULAR | Status: DC | PRN

## 2021-03-28 NOTE — BH Assessment (Addendum)
[facility] provider Assunta Found, NP, recommended psychiatric Inpatient admission when medically cleared.   Followed up with [facility] to inquire about bed availability. Awaiting updates.   Yesterday, patient was noted to be faxed out to multiple hospitals for consideration of a bed admission.   Upon today's chart review LCSW received a call from Rosalita Chessman stating that patient was under review.   Followed with [facility] and was told to call back in the morning after 8am to inquire about patient's status.   Re-faxed referrals to the following facilities for consideration of bed placement:  [facility] Details [facility] Details [facility] Details [facility] Details [facility] Details [facility] Details [facility] Details [facility] Details [facility] Details [facility]

## 2021-03-28 NOTE — ED Provider Notes (Signed)
Emergency Medicine Observation Re-evaluation Note  Melchor Kirchgessner is a 65 y.o. male, seen on rounds today.  Pt initially presented to the ED for complaints of Fall Currently, the patient is calm and resting.  Physical Exam  BP (!) 159/100 (BP Location: Left Arm)   Pulse (!) 104   Temp 98.2 F (36.8 C)   Resp (!) 22   Ht 1.727 m (5\' 8" )   Wt 77.1 kg   SpO2 96%   BMI 25.85 kg/m  Physical Exam General: no acute distress - sitting naked in his room - asking for food and discharge Cardiac: heart rate of 100 on my exam Lungs: clear lungs - no tachypnea, sat's 96% Psych: mildly agitated - not violent with this examiner  ED Course / MDM  EKG:EKG Interpretation  Date/Time:  Friday March 25 2021 08:48:02 EDT Ventricular Rate:  108 PR Interval:  182 QRS Duration: 88 QT Interval:  333 QTC Calculation: 447 R Axis:   106 Text Interpretation: Sinus tachycardia Consider right atrial enlargement Right axis deviation Confirmed by [id number] [phone]) on 03/25/2021 9:08:27 AM   I have reviewed the labs performed to date as well as medications administered while in observation.  Recent changes in the last 24 hours include awaiting placement at multiple facilities.  Plan  Current plan is for transfer for inpatient psych. Patient is under full IVC at this time.   05/25/2021, MD 03/28/21 [phone]

## 2021-03-28 NOTE — ED Notes (Signed)
Patient was given a 2 Cups of Ginger Ale and Graham Crackers w/ Peanut Butter.

## 2021-03-28 NOTE — ED Notes (Signed)
Pt on phone with his 2nd phone call

## 2021-03-28 NOTE — Progress Notes (Signed)
Pt review at [employer].

## 2021-03-28 NOTE — Progress Notes (Signed)
CSW received a return a call from Suzanne at [facility], she reports that the patient is under review and if accepted CSW will receive a call back.  Stephen Delacruz, MSW, LCSW 03/28/2021 8:22 AM    
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
## 2021-03-28 NOTE — ED Notes (Signed)
PT started to get agitated and RN called security.

## 2021-03-29 MED ORDER — LORAZEPAM 2 MG/ML IJ SOLN
1.0000 mg | Freq: Once | INTRAMUSCULAR | Status: AC
  Administered 2021-03-29: 1 mg via INTRAMUSCULAR
  Filled 2021-03-29: qty 1

## 2021-03-29 NOTE — ED Notes (Signed)
Patient was given Snacks and Apple Juice.

## 2021-03-29 NOTE — BH Assessment (Signed)
[facility] is reviewing th patient for possible placement.

## 2021-03-29 NOTE — ED Notes (Signed)
IVC Breakfast orders placed

## 2021-03-29 NOTE — ED Notes (Signed)
Pt's speech continues to be rapid with flight of ideas intermingled with vulgarity. Pt speaking excessively loud while in hall.

## 2021-03-29 NOTE — ED Notes (Signed)
PT sleeping will check vitals when he wakes up

## 2021-03-29 NOTE — ED Provider Notes (Signed)
Emergency Medicine Observation Re-evaluation Note  Stephen Delacruz is a 65 y.o. male, seen on rounds today.  Pt initially presented to the ED for complaints of Fall Currently, the patient is sleeping.  Physical Exam  BP (!) 185/89   Pulse (!) 104   Temp 98.2 F (36.8 C)   Resp 20   Ht 5\' 8"  (1.727 m)   Wt 77.1 kg   SpO2 96%   BMI 25.85 kg/m  Physical Exam General: No acute distress Cardiac: Well-perfused Lungs: Nonlabored Psych: Cooperative  ED Course / MDM  EKG:EKG Interpretation  Date/Time:  Friday March 25 2021 08:48:02 EDT Ventricular Rate:  108 PR Interval:  182 QRS Duration: 88 QT Interval:  333 QTC Calculation: 447 R Axis:   106 Text Interpretation: Sinus tachycardia Consider right atrial enlargement Right axis deviation Confirmed by 02-14-1973 [phone]) on 03/25/2021 9:08:27 AM   I have reviewed the labs performed to date as well as medications administered while in observation.  Recent changes in the last 24 hours include psychiatric evaluation recommending inpatient.  Plan  Current plan is for inpatient bed search. Patient is under full IVC at this time.   05/25/2021, MD 03/29/21 05/29/21

## 2021-03-29 NOTE — Progress Notes (Signed)
CSW received call from [place] and Mannie Stabile, pt remains under review at this time.  Penni Homans, MSW, LCSW 03/29/2021 8:39 AM

## 2021-03-29 NOTE — ED Notes (Signed)
Patient was given a Cup of Coffee. 

## 2021-03-29 NOTE — ED Notes (Signed)
Pt sitting chair in door way to his room speaking excessively loud with flight of ideas intermixed  with vulgarity . Pt requesting food and eating non stop.

## 2021-03-29 NOTE — ED Notes (Signed)
Pt manic , talking in a fast streaming fashion. Pt pacing in hall. Pt asking for  Ativan because it works better.

## 2021-03-29 NOTE — ED Notes (Signed)
Pt manic speech rapid with flight of ideas.  Pt pacing in hall.

## 2021-03-29 NOTE — ED Notes (Signed)
TC from [facility]  Checking on possible Pt placement.

## 2021-03-29 NOTE — Progress Notes (Signed)
Pt under review at [facility]. 

## 2021-03-30 ENCOUNTER — Encounter (HOSPITAL_COMMUNITY): Payer: Self-pay | Admitting: Registered Nurse

## 2021-03-30 MED ORDER — LORAZEPAM 1 MG PO TABS
1.0000 mg | ORAL_TABLET | Freq: Once | ORAL | Status: AC
  Administered 2021-03-30: 1 mg via ORAL
  Filled 2021-03-30: qty 1

## 2021-03-30 NOTE — ED Notes (Signed)
PT stated that he could not locate his phone and wallet.  No phone or wallet was noted on the inventory sheet and there was not yellow security sheet.

## 2021-03-30 NOTE — ED Notes (Signed)
Called staffing. Sitter switched from 50 to 52.

## 2021-03-30 NOTE — Discharge Instructions (Addendum)
It was our pleasure to provide your ER care today - we hope that you feel better.  Take your meds as prescribed. Avoid any drug use. Use resource guide provided for treatment/rehab options in the community.   Follow up with primary care doctor in 1-2 weeks - also have blood pressure rechecked then, as it is high.   For mental health issues and/or crisis, you may go directly to the [facility]al [facility] Center - it is open 24/7 and walk-ins are welcome.   Return to ER if worse, new symptoms, fevers, new or severe pain, trouble breathing, or other concern.

## 2021-03-30 NOTE — Progress Notes (Signed)
Patient was provided with a MH resource list and substance abuse resource list.

## 2021-03-30 NOTE — Progress Notes (Signed)
Pt was referred out to the following facilities due to needing a geropsychiatric, no appropriate beds at [facility].   [facility]  [address], [place] [address] [phone]  [facility]  [address] [place] [address] [phone]  [facility]  [address] [place] [address] [phone]  [facility]  [address] [place], [place] [address] [phone]  [facility]  [address] [place] [address] [phone]  [facility]  [address] [place], [place] [address] [phone]  [facility]  [address] [place] [address] [phone]  [facility]  [address] [place] [address] [phone]  [facility]  [address] [place] [place] [address] [phone]  [facility]  [address], [place] [address] [phone]  [facility]  [address] [place], [place] [address] [phone]  [facility]  [address] [place] [address] [phone]  [facility]  [address], [place] [address] [phone]  [facility]  [address], [place] [address] [phone]  [facility]  [address] [place] [address] [phone]  [facility]  [address] [place] [address] [phone]  [facility]  [address], [place] [address] [phone]  [facility]  [address] [place] [address] [phone]  [facility]  [address]  [place] [address] [phone]  [facility]  [address], [place] [address] [phone]  [facility]  [address], [place] [address] [phone]  [facility]  [address] [place], [place] [address] [phone]  [facility]  [address], [place] [address] [phone]  [facility] [address]  [address], [place] [address] [phone]  [facility]  [address] [place] [address] [phone]  [facility]  [address] [place] [address] [phone]  [facility]  [address] [place], [place] [address] [phone]  [facility]  [address], [place] [address] [phone]  [facility]  [address] [place] [address] [phone]  [facility]  [address] [place] [address] [phone]  [facility]  [address], [place] [address] [phone]  [facility] [address] [place]  [address], [place] [address] [phone]   Situation ongoing CSW will follow.

## 2021-03-30 NOTE — ED Provider Notes (Addendum)
Emergency Medicine Observation Re-evaluation Note  Stephen Delacruz is a 65 y.o. male, seen on rounds today.  Pt initially presented to the ED for complaints of mental health issues. Pt with delusion thoughts. Currently calm and alert, awaiting BH placement.   Physical Exam  BP 134/66 (BP Location: Right Arm)   Pulse 84   Temp 98.1 F (36.7 C) (Oral)   Resp 18   Ht 1.727 m (5\' 8" )   Wt 77.1 kg   SpO2 98%   BMI 25.85 kg/m  Physical Exam General: alert, content Cardiac: regular rate Lungs: breathing comfortably Psych: alert, content. Does not currently appear to be responding to internal stimuli.   ED Course / MDM  EKG:EKG Interpretation  Date/Time:  Friday March 25 2021 08:48:02 EDT Ventricular Rate:  108 PR Interval:  182 QRS Duration: 88 QT Interval:  333 QTC Calculation: 447 R Axis:   106 Text Interpretation: Sinus tachycardia Consider right atrial enlargement Right axis deviation Confirmed by 02-14-1973 [phone]) on 03/25/2021 9:08:27 AM   I have reviewed the labs performed to date as well as medications administered while in observation.  Recent changes in the last 24 hours include stabilization on meds, BH reassessment and placement.  Plan  Current plan is for [facility] team to arrange [facility] placement ? Possibly to [facility] or [facility].      [facility], MD 03/30/21 [phone]  Pt team has reassessed and indicates pt psych clear for d/c:     On reassessment, no SI or HI. No thoughts of any harm to self or others. No active hallucinations or delusions. Pt has eaten/drank, ambulatory about ED, and currently appears stable for d/c.       0240, MD 03/30/21 1438

## 2021-03-30 NOTE — Progress Notes (Signed)
CSW received a call from [place], pt remains under review.   Penni Homans, MSW, LCSW 03/30/2021 8:26 AM
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
## 2021-03-30 NOTE — ED Notes (Signed)
Pt requesting ativan . MD notified.

## 2021-04-06 ENCOUNTER — Emergency Department (HOSPITAL_COMMUNITY)
Admission: EM | Admit: 2021-04-06 | Discharge: 2021-04-06 | Disposition: A | Discharge status: Home / Self Care | Payer: Medicare Other | Attending: Emergency Medicine | Admitting: Emergency Medicine

## 2021-04-06 DIAGNOSIS — R456 Violent behavior: Secondary | ICD-10-CM | POA: Insufficient documentation

## 2021-04-06 DIAGNOSIS — I10 Essential (primary) hypertension: Secondary | ICD-10-CM | POA: Insufficient documentation

## 2021-04-06 DIAGNOSIS — F17210 Nicotine dependence, cigarettes, uncomplicated: Secondary | ICD-10-CM | POA: Diagnosis not present

## 2021-04-06 DIAGNOSIS — Z79899 Other long term (current) drug therapy: Secondary | ICD-10-CM | POA: Diagnosis not present

## 2021-04-06 DIAGNOSIS — R4689 Other symptoms and signs involving appearance and behavior: Secondary | ICD-10-CM

## 2021-04-06 DIAGNOSIS — Z8659 Personal history of other mental and behavioral disorders: Secondary | ICD-10-CM | POA: Diagnosis not present

## 2021-04-06 MED ORDER — LORAZEPAM 2 MG/ML IJ SOLN
2.0000 mg | INTRAMUSCULAR | Status: DC | PRN
  Administered 2021-04-06: 2 mg via INTRAMUSCULAR
  Filled 2021-04-06: qty 1

## 2021-04-06 MED ORDER — STERILE WATER FOR INJECTION IJ SOLN
INTRAMUSCULAR | Status: AC
  Administered 2021-04-06: 1.2 mL
  Filled 2021-04-06: qty 10

## 2021-04-06 MED ORDER — ZIPRASIDONE MESYLATE 20 MG IM SOLR
20.0000 mg | INTRAMUSCULAR | Status: DC | PRN
  Administered 2021-04-06: 20 mg via INTRAMUSCULAR
  Filled 2021-04-06: qty 20
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
## 2021-04-08 ENCOUNTER — Other Ambulatory Visit (HOSPITAL_COMMUNITY): Payer: Self-pay | Admitting: Psychiatry

## 2021-04-10 ENCOUNTER — Emergency Department (HOSPITAL_COMMUNITY): Payer: Medicare Other

## 2021-04-10 ENCOUNTER — Emergency Department (HOSPITAL_COMMUNITY)
Admission: EM | Admit: 2021-04-10 | Discharge: 2021-04-14 | Disposition: A | Discharge status: Home / Self Care | Payer: Medicare Other | Attending: Emergency Medicine | Admitting: Emergency Medicine

## 2021-04-10 DIAGNOSIS — S0083XA Contusion of other part of head, initial encounter: Secondary | ICD-10-CM | POA: Diagnosis not present

## 2021-04-10 DIAGNOSIS — K8020 Calculus of gallbladder without cholecystitis without obstruction: Secondary | ICD-10-CM | POA: Diagnosis not present

## 2021-04-10 DIAGNOSIS — Z79899 Other long term (current) drug therapy: Secondary | ICD-10-CM | POA: Insufficient documentation

## 2021-04-10 DIAGNOSIS — R4689 Other symptoms and signs involving appearance and behavior: Secondary | ICD-10-CM | POA: Diagnosis not present

## 2021-04-10 DIAGNOSIS — T1490XA Injury, unspecified, initial encounter: Secondary | ICD-10-CM

## 2021-04-10 DIAGNOSIS — S62201A Unspecified fracture of first metacarpal bone, right hand, initial encounter for closed fracture: Secondary | ICD-10-CM | POA: Diagnosis not present

## 2021-04-10 DIAGNOSIS — R45851 Suicidal ideations: Secondary | ICD-10-CM

## 2021-04-10 DIAGNOSIS — S62501A Fracture of unspecified phalanx of right thumb, initial encounter for closed fracture: Secondary | ICD-10-CM

## 2021-04-10 DIAGNOSIS — F1420 Cocaine dependence, uncomplicated: Secondary | ICD-10-CM | POA: Diagnosis not present

## 2021-04-10 DIAGNOSIS — F19950 Other psychoactive substance use, unspecified with psychoactive substance-induced psychotic disorder with delusions: Secondary | ICD-10-CM | POA: Diagnosis not present

## 2021-04-10 DIAGNOSIS — I10 Essential (primary) hypertension: Secondary | ICD-10-CM | POA: Insufficient documentation

## 2021-04-10 DIAGNOSIS — K5730 Diverticulosis of large intestine without perforation or abscess without bleeding: Secondary | ICD-10-CM | POA: Insufficient documentation

## 2021-04-10 DIAGNOSIS — R000 Tachycardia, unspecified: Secondary | ICD-10-CM | POA: Diagnosis not present

## 2021-04-10 DIAGNOSIS — F17210 Nicotine dependence, cigarettes, uncomplicated: Secondary | ICD-10-CM | POA: Diagnosis not present

## 2021-04-10 DIAGNOSIS — F1220 Cannabis dependence, uncomplicated: Secondary | ICD-10-CM | POA: Diagnosis present

## 2021-04-10 DIAGNOSIS — I700 Atherosclerosis of aorta: Secondary | ICD-10-CM | POA: Insufficient documentation

## 2021-04-10 DIAGNOSIS — Z20822 Contact with and (suspected) exposure to covid-19: Secondary | ICD-10-CM | POA: Insufficient documentation

## 2021-04-10 DIAGNOSIS — F319 Bipolar disorder, unspecified: Secondary | ICD-10-CM | POA: Insufficient documentation

## 2021-04-10 DIAGNOSIS — F250 Schizoaffective disorder, bipolar type: Secondary | ICD-10-CM | POA: Diagnosis present

## 2021-04-10 DIAGNOSIS — S6991XA Unspecified injury of right wrist, hand and finger(s), initial encounter: Secondary | ICD-10-CM | POA: Diagnosis present

## 2021-04-10 LAB — COMPREHENSIVE METABOLIC PANEL
ALT: 20 U/L (ref 0–44)
AST: 25 U/L (ref 15–41)
Albumin: 3.5 g/dL (ref 3.5–5.0)
Alkaline Phosphatase: 106 U/L (ref 38–126)
Anion gap: 8 (ref 5–15)
BUN: 17 mg/dL (ref 8–23)
CO2: 22 mmol/L (ref 22–32)
Calcium: 8.7 mg/dL — ABNORMAL LOW (ref 8.9–10.3)
Chloride: 111 mmol/L (ref 98–111)
Creatinine, Ser: 1.1 mg/dL (ref 0.61–1.24)
GFR, Estimated: >60 mL/min (ref >60)
Glucose, Bld: 105 mg/dL — ABNORMAL HIGH (ref 70–99)
Potassium: 4.3 mmol/L (ref 3.5–5.1)
Sodium: 141 mmol/L (ref 135–145)
Total Bilirubin: 0.4 mg/dL (ref 0.3–1.2)
Total Protein: 6.1 g/dL — ABNORMAL LOW (ref 6.5–8.1)

## 2021-04-10 LAB — CBC
HCT: 40.9 % (ref 39.0–52.0)
Hemoglobin: 13.5 g/dL (ref 13.0–17.0)
MCH: 32.1 pg (ref 26.0–34.0)
MCHC: 33 g/dL (ref 30.0–36.0)
MCV: 97.1 fL (ref 80.0–100.0)
Platelets: 394 10*3/uL (ref 150–400)
RBC: 4.21 MIL/uL — ABNORMAL LOW (ref 4.22–5.81)
RDW: 14.1 % (ref 11.5–15.5)
WBC: 12.9 10*3/uL — ABNORMAL HIGH (ref 4.0–10.5)
nRBC: 0 % (ref 0.0–0.2)

## 2021-04-10 LAB — ETHANOL: Alcohol, Ethyl (B): <10 mg/dL (ref <10)

## 2021-04-10 LAB — PROTIME-INR
INR: 1 (ref 0.8–1.2)
Prothrombin Time: 13.3 seconds (ref 11.4–15.2)

## 2021-04-10 LAB — LACTIC ACID, PLASMA: Lactic Acid, Venous: 1.4 mmol/L (ref 0.5–1.9)

## 2021-04-10 MED ORDER — HALOPERIDOL LACTATE 5 MG/ML IJ SOLN
10.0000 mg | Freq: Once | INTRAMUSCULAR | Status: AC
  Administered 2021-04-10: 10 mg via INTRAVENOUS
  Filled 2021-04-10: qty 2

## 2021-04-10 MED ORDER — LORAZEPAM 2 MG/ML IJ SOLN: 2.0000 mg | Freq: Once | INTRAMUSCULAR | Status: AC

## 2021-04-10 MED ORDER — SODIUM CHLORIDE 0.9 % IV BOLUS
1000.0000 mL | Freq: Once | INTRAVENOUS | Status: AC
  Administered 2021-04-10: 1000 mL via INTRAVENOUS

## 2021-04-10 MED ORDER — IOHEXOL 300 MG/ML  SOLN
100.0000 mL | Freq: Once | INTRAMUSCULAR | Status: AC | PRN
  Administered 2021-04-10: 100 mL via INTRAVENOUS

## 2021-04-10 MED ORDER — LORAZEPAM 2 MG/ML IJ SOLN
INTRAMUSCULAR | Status: AC
  Administered 2021-04-10: 2 mg via INTRAVENOUS
  Filled 2021-04-10: qty 1

## 2021-04-10 MED ORDER — ZIPRASIDONE MESYLATE 20 MG IM SOLR: 10.0000 mg | Freq: Once | INTRAMUSCULAR | Status: DC

## 2021-04-10 MED ORDER — IOHEXOL 300 MG/ML  SOLN: 100.0000 mL | Freq: Once | INTRAMUSCULAR | Status: DC | PRN

## 2021-04-10 NOTE — ED Notes (Signed)
IVC paperwork completed.  

## 2021-04-10 NOTE — BH Assessment (Signed)
Received TTS consult order. Spoke to RN who said Pt has been given medication and is currently unable to participate in a tele-assessment. TTS will complete assessment when Pt is able to participate.   Pamalee Leyden, [facility], [facility] Triage Specialist [phone]
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
## 2021-04-10 NOTE — ED Notes (Signed)
Pt becoming increasingly verbally abusive

## 2021-04-10 NOTE — ED Notes (Signed)
Patient changed into gown, placed on monitor. Belongings collected, inventoried, and placed in locker #[id number].

## 2021-04-10 NOTE — ED Notes (Signed)
It was best determined that the thumb spica should wait until the patient is conscious alert and oriented for application in hopes of emphasizing safety concerns upon application.
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
## 2021-04-11 ENCOUNTER — Encounter (HOSPITAL_COMMUNITY): Payer: Self-pay | Admitting: Registered Nurse

## 2021-04-11 LAB — RESP PANEL BY RT-PCR (FLU A&B, COVID) ARPGX2
Influenza A by PCR: NEGATIVE
Influenza B by PCR: NEGATIVE
SARS Coronavirus 2 by RT PCR: NEGATIVE

## 2021-04-11 MED ORDER — ZIPRASIDONE MESYLATE 20 MG IM SOLR
INTRAMUSCULAR | Status: AC
  Filled 2021-04-11: qty 20

## 2021-04-11 MED ORDER — STERILE WATER FOR INJECTION IJ SOLN
INTRAMUSCULAR | Status: AC
  Administered 2021-04-11: 1 mL
  Filled 2021-04-11: qty 10

## 2021-04-11 MED ORDER — ZIPRASIDONE MESYLATE 20 MG IM SOLR
20.0000 mg | Freq: Once | INTRAMUSCULAR | Status: AC
  Administered 2021-04-11: 20 mg via INTRAMUSCULAR

## 2021-04-11 MED ORDER — OXCARBAZEPINE 150 MG PO TABS
150.0000 mg | ORAL_TABLET | Freq: Two times a day (BID) | ORAL | Status: DC
  Administered 2021-04-11 – 2021-04-14 (×6): 150 mg via ORAL
  Filled 2021-04-11 (×10): qty 1

## 2021-04-11 MED ORDER — AMLODIPINE BESYLATE 5 MG PO TABS
10.0000 mg | ORAL_TABLET | Freq: Every day | ORAL | Status: DC
Start: 2021-04-11 — End: 2021-04-14
  Administered 2021-04-11 – 2021-04-14 (×3): 10 mg via ORAL
  Filled 2021-04-11 (×3): qty 2

## 2021-04-11 MED ORDER — OLANZAPINE 5 MG PO TBDP
30.0000 mg | ORAL_TABLET | Freq: Every day | ORAL | Status: DC
  Administered 2021-04-11 – 2021-04-13 (×3): 30 mg via ORAL
  Filled 2021-04-11 (×3): qty 6

## 2021-04-11 MED ORDER — OLANZAPINE 5 MG PO TBDP
10.0000 mg | ORAL_TABLET | Freq: Once | ORAL | Status: AC
  Administered 2021-04-11: 10 mg via ORAL
  Filled 2021-04-11: qty 2

## 2021-04-11 MED ORDER — THIAMINE HCL 100 MG PO TABS
100.0000 mg | ORAL_TABLET | Freq: Every day | ORAL | Status: DC
  Administered 2021-04-11 – 2021-04-14 (×3): 100 mg via ORAL
  Filled 2021-04-11 (×3): qty 1

## 2021-04-11 MED ORDER — LORAZEPAM 2 MG/ML IJ SOLN
2.0000 mg | Freq: Once | INTRAMUSCULAR | Status: AC
  Administered 2021-04-11: 2 mg via INTRAMUSCULAR
  Filled 2021-04-11: qty 1

## 2021-04-11 MED ORDER — GABAPENTIN 100 MG PO CAPS
100.0000 mg | ORAL_CAPSULE | Freq: Three times a day (TID) | ORAL | Status: DC
  Administered 2021-04-11 – 2021-04-14 (×9): 100 mg via ORAL
  Filled 2021-04-11 (×9): qty 1

## 2021-04-11 NOTE — ED Notes (Signed)
Pt awake and eating breakfast.

## 2021-04-11 NOTE — ED Notes (Signed)
Placed Breakfast order 

## 2021-04-11 NOTE — ED Provider Notes (Signed)
Patient is agitated, walking into other rooms and threatening staff. He is under IVC. Will give IM Roger Shelter, MD 04/11/21 1318
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
## 2021-04-11 NOTE — BH Assessment (Addendum)
Disposition: Per Assunta Found, NP inpatient treatment is recommended. Patient under review at [facility] Followed up with [facility] AC Richelle Ito, RN) @ [phone] via secure chat to inquire about bed availability. Disposition pending.  Linsey reported back no appropriate bed availability at [facility]. Patient faxed to the following hospitals for consideration of bed placement:   [facility] Details  [facility] Details  [facility] Details  [facility] Details  [facility] Details  [facility] Details  [facility] Details  [facility] Details  [facility] Details  [facility] Details  [facility] Details  [facility] Details  [facility] Details  [facility] Details  [facility] Details  [facility] Details  [facility] Details  [facility] Details  [facility] Details  [facility] Details  [facility] Details  [facility] Details  [facility] Details  [facility] Details  [facility] Details  [facility] Details  [facility] Details  [facility] Details  [facility] Details  [facility]

## 2021-04-11 NOTE — ED Notes (Signed)
Inadvertently completed the violent restraint order.  Pt is not in violent restraints.  PT is sleeping after administration of haldol and ativan.

## 2021-04-11 NOTE — ED Notes (Addendum)
Patient arrived to the psych safe treatment purple zone and this RN's care with sitter via stretcher. Patient drowsy, but rousing. A few minutes after arriving, patient out of bed and ambulated to the rest room. Steady and even gait. Patient then went into another patient's room and started talking to her aggressively about taking care of her and calling her sweetheart. Sitter and this RN stood between male patient and Mr Tavella. This patient was able to be returned to his room when security arrived. Patient making sexual harassment type conversation about making sitters and RN and male patients his 17th and so on wives. Patient given lunch tray and started to eat, while one security officer remained on unit. Patient out of his room again and into male patient's doorway again. This patient raised his arm to swing at the sitter and this RN. Patient did not respond to humor or to calm redirection. Patient's door held while another patient walked past to go to the bathroom. Patient pulled code alarm in his room and was calling the security "candy ass officer's" Patient's door handle released when other patient returned to her room. Medication given as ordered. Patient in his room talking aggressively at this time to the officers, but sitting on his bed. Officers remain in unit at this time.

## 2021-04-11 NOTE — Progress Notes (Signed)
Orthopedic Tech Progress Note Patient Details:  Stephen Delacruz Feb 26, 1956 [id number]  Ortho Devices Type of Ortho Device: Thumb velcro splint Ortho Device/Splint Location: RUE Ortho Device/Splint Interventions: Ordered,Application,Adjustment   Post Interventions Patient Tolerated: Well Instructions Provided: Care of device   [name] 04/11/2021, 9:55 AM

## 2021-04-12 ENCOUNTER — Encounter (HOSPITAL_COMMUNITY): Payer: Self-pay | Admitting: Registered Nurse

## 2021-04-12 DIAGNOSIS — F19950 Other psychoactive substance use, unspecified with psychoactive substance-induced psychotic disorder with delusions: Secondary | ICD-10-CM | POA: Diagnosis present

## 2021-04-12 DIAGNOSIS — R4689 Other symptoms and signs involving appearance and behavior: Secondary | ICD-10-CM

## 2021-04-12 DIAGNOSIS — F1220 Cannabis dependence, uncomplicated: Secondary | ICD-10-CM

## 2021-04-12 DIAGNOSIS — F1420 Cocaine dependence, uncomplicated: Secondary | ICD-10-CM | POA: Diagnosis not present

## 2021-04-12 MED ORDER — HYDROXYZINE HCL 50 MG PO TABS
50.0000 mg | ORAL_TABLET | Freq: Once | ORAL | Status: AC
  Administered 2021-04-12: 50 mg via ORAL
  Filled 2021-04-12: qty 1

## 2021-04-12 MED ORDER — LORAZEPAM 2 MG/ML IJ SOLN
2.0000 mg | Freq: Once | INTRAMUSCULAR | Status: AC
  Administered 2021-04-12: 2 mg via INTRAMUSCULAR
  Filled 2021-04-12: qty 1

## 2021-04-12 MED ORDER — ZIPRASIDONE MESYLATE 20 MG IM SOLR
20.0000 mg | Freq: Once | INTRAMUSCULAR | Status: AC
  Administered 2021-04-12: 20 mg via INTRAMUSCULAR
  Filled 2021-04-12: qty 20

## 2021-04-12 MED ORDER — LORAZEPAM 1 MG PO TABS
2.0000 mg | ORAL_TABLET | Freq: Once | ORAL | Status: AC
  Administered 2021-04-12: 2 mg via ORAL
  Filled 2021-04-12: qty 2

## 2021-04-12 MED ORDER — HALOPERIDOL LACTATE 5 MG/ML IJ SOLN
5.0000 mg | Freq: Once | INTRAMUSCULAR | Status: AC
  Administered 2021-04-12: 5 mg via INTRAMUSCULAR
  Filled 2021-04-12: qty 1

## 2021-04-12 NOTE — ED Notes (Signed)
Pt out of room,attempted to redirected numerous times, pt uncooperative, verbally aggressive towards staff. Pt took dirty linen cart and pin sitter again door. Security called via radio. Was able to redirect pt away from sitter. Pt pushed cart towards this nurse and was swinging arms at staff. He went back into room and started to slam door repeatedly, while punch the glass.     Security to bedside pt placed back into 4 point restraints and Dr Effie Shy aware.

## 2021-04-12 NOTE — ED Provider Notes (Addendum)
Emergency Medicine Observation Re-evaluation Note  Stephen Delacruz is a 65 y.o. male, seen on rounds today.  Pt initially presented to the ED for complaints of Trauma and Aggressive Behavior Currently, the patient is standing in his room, pressing the call button.  Security officers are at his side.  The patient had to be redirected to his room by security, because of wandering the hallways and sexually inappropriate statements made to staff.  The patient is conversant and wonders if I am going to give him an injection of Geodon.  He snickered  as he told me that.  I asked him to stay in his room and remain cooperative and respectful.  Physical Exam  BP (!) 149/92   Pulse 94   Temp 98 F (36.7 C) (Oral)   Resp 16   SpO2 95%  Physical Exam General: Mildly agitated Cardiac: Normal heart rate Lungs: Normal respiratory rate Psych: Confabulating, inattentive, redirectable  ED Course / MDM  EKG:EKG Interpretation  Date/Time:  Sunday April 10 2021 20:49:25 EDT Ventricular Rate:  107 PR Interval:  176 QRS Duration: 85 QT Interval:  320 QTC Calculation: 427 R Axis:   98 Text Interpretation: Sinus tachycardia Right axis deviation Confirmed by Virgina Norfolk ([id number]) on 04/10/2021 9:35:19 PM Also confirmed by Virgina Norfolk [phone]), editor Elita Quick ([id number])  on 04/11/2021 9:34:57 AM   I have reviewed the labs performed to date as well as medications administered while in observation.  Recent changes in the last 24 hours include he is required sedation, with IM Ativan and IM Geodon.  He took Zyprexa last night as scheduled.  He does not have other medications scheduled.  Plan  Current plan is for psychiatric placement.  I will give him a dose of hydroxyzine and Ativan, hopefully orally, but IM if he will not take it orally.  I will ask psychiatry to evaluate him for medication recommendations. Patient is under full IVC at this time.   Mancel Bale, MD 04/12/21 [phone]   Clinical Course  as of 04/12/21 1034  Tue Apr 12, 2021  1023 He has become more agitated, and difficult to redirect.  I have ordered Geodon and restraints if needed to control the situation.  Currently, he is a danger to staff and himself. [EW]    Clinical Course User Index [EW] Mancel Bale, MD      Mancel Bale, MD 04/12/21 1034
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
## 2021-04-12 NOTE — ED Notes (Signed)
Pt continually coming out of his room making sexually inappropriate commands to nurse.

## 2021-04-12 NOTE — ED Notes (Signed)
S Rankin NP secured messaged that pt is now awake

## 2021-04-12 NOTE — ED Notes (Signed)
Pt is slamming door repetitively, punching glass on door. Security at bedside Dr Effie Shy aware

## 2021-04-12 NOTE — Consult Note (Signed)
  Attempted to set up tele health assessment for psychiatric consult.  Spoke with Gigi Gin, RN patients' nurse stated that patient was in 4 point restraints and sleeping.     Will attempt to assess at later time or tomorrow.    Patient psychotropic medications have been stated  . amLODipine  10 mg Oral Daily  . gabapentin  100 mg Oral TID  . olanzapine zydis  30 mg Oral QHS  . OXcarbazepine  150 mg Oral BID  . thiamine  100 mg Oral Daily   Labs review.  No UDS collected at this time.  Also ordered urinalysis.    Will continue with current disposition.  Disposition: Recommend psychiatric Inpatient admission when medically cleared.
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
## 2021-04-12 NOTE — ED Notes (Signed)
Pt verbally aggressive toward security and staff

## 2021-04-12 NOTE — ED Notes (Signed)
Pt removed call light strap from bathroom and placed it in pocket. Pt refused to give strap to nursing staff. Security called, pt is verbally aggressive and agitated at this time. Pt removed strap from pt pocket and hid it under bed. Strap removed from pt room. Pt pulled down his pants and started to masturbate. Pt was told to stop and behavior was not appropriate at this time. Pt now pacing around room, yelling at staff.

## 2021-04-12 NOTE — ED Notes (Signed)
Dr Effie Shy aware of pt behavior

## 2021-04-12 NOTE — ED Notes (Signed)
Pt walked out of room, pt stated he had to use bathroom. Pt walked back to his room. Pt given urinal and assisted back into stretcher.

## 2021-04-12 NOTE — ED Notes (Signed)
Pt was put on restraints at 10:39
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
## 2021-04-12 NOTE — ED Notes (Signed)
This RN and Archivist staff changed pt and bed sheets per urinary incontinence.

## 2021-04-12 NOTE — ED Notes (Addendum)
RN spoke with main pharmacy tech for pt's due zyprexa tablets. None left in ED pyxis. Main pharmacy technician said that tablets will be sent down
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
## 2021-04-12 NOTE — ED Notes (Signed)
Pt out of room ambulating in hallway without pants on, pt attempting to go into other pt rooms. Pt redirect into room, pants placed on pt and linen changed. Pt is being verbally aggressive towards staff.

## 2021-04-12 NOTE — ED Notes (Signed)
Restrains removed from pt x 4

## 2021-04-12 NOTE — ED Notes (Signed)
Dr Effie Shy aware of pt behavior and staff concerns. Security called and Dr Effie Shy will be over to assess pt.

## 2021-04-12 NOTE — Progress Notes (Addendum)
CSW referred patient out again to the following facilities at this time. No facilities have expressed interested since referral on 04/11/21.   CSW will continue to pursue inpatient [facility] admission.   Destination  Service Provider Address Phone Fax  [facility]  [address] [place] [address] [phone]  [facility]  [address] [place] [address] [phone]  [facility]  [address] [place] [address] [phone]  [facility]  [address] [place], [place] [address] [phone]  [facility] [address] [place]  [address] [place] [address] [phone]  [facility]  [address] [place] [address] [phone]  [facility]  [address] [place], [place] [address] [phone]  [facility]  [address] [place] [address] [phone]  [facility]  [address] [place] [address] [phone]  [facility]  [address] [place] [address] [phone]  [facility]  [address] [place], [place] [address] [phone]  [facility]  [address], [place] [address] [phone]  [facility]  [address] [place] [address] [phone]  [facility]  [address] [place] [address] [phone]  [facility]  [address] [place] [address] [phone]  [facility]  [address] [place] [address] [phone]  [facility]  [address], [place] [address] [phone]  [facility]  [address], [place] [address] [phone]  [facility]  [address], [place] [address] [phone]  [facility]  [address], [place] [address] [phone]  [facility]  [address] [place] [address] [phone]  [facility]  [address] [place], [place] [address] [phone]  [facility]  [address] [place], [place] [address] [phone]  [facility]  [address] [place] [address] [phone]  [facility]  [address], [place] [address] [phone]  [facility] [address]  [address] [place] [address] [phone]  [facility]  [address] [place] [address] [phone]  [facility]  [address], [place] [address] [phone]  [facility]  [address] [place] [address] [phone]  [facility]  [address] [place] [address] [phone]    Signed:  Corky Crafts, MSW, [place], Bridget Hartshorn 04/12/2021 12:34 PM
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
## 2021-04-12 NOTE — ED Notes (Signed)
TTS machine at pt bedside for evaluation

## 2021-04-12 NOTE — ED Notes (Signed)
Dr Effie Shy aware that pt restraint order needs renew as he is still aggressive and agitated

## 2021-04-12 NOTE — ED Notes (Signed)
Pt awoke started to yell and urinated over side of bed.

## 2021-04-12 NOTE — ED Notes (Signed)
Pt is awake sitting at end of bed, redirected to sit back in the bed

## 2021-04-13 NOTE — ED Notes (Signed)
Bilateral Wrist restraints discontinued at this time. Patient verbalized understanding regarding behavior

## 2021-04-13 NOTE — ED Provider Notes (Signed)
Emergency Medicine Observation Re-evaluation Note  Stephen Delacruz is a 65 y.o. male, seen on rounds today.  Pt initially presented to the ED for complaints of Trauma and Aggressive Behavior Currently, the patient is eating breakfast.  He apparently spilled his first 1 and was given a second breakfast.  Overnight he had an episode of urinary incontinence.  He has been intermittently placed into restraints over the last 24 hours, for violent behavior.  At this time he is somewhat sedated, and calm.  Physical Exam  BP (!) 180/96 (BP Location: Right Arm)   Pulse 99   Temp 97.8 F (36.6 C) (Oral)   Resp 16   SpO2 97%  Physical Exam General: Nontoxic appearance Cardiac: Normal heart rate Lungs: Normal respiratory rate Psych: He appears responding to internal stimuli, gesticulating, and looking into the corners of the room.  ED Course / MDM  EKG:EKG Interpretation  Date/Time:  Sunday April 10 2021 20:49:25 EDT Ventricular Rate:  107 PR Interval:  176 QRS Duration: 85 QT Interval:  320 QTC Calculation: 427 R Axis:   98 Text Interpretation: Sinus tachycardia Right axis deviation Confirmed by Virgina Norfolk ([id number]) on 04/10/2021 9:35:19 PM Also confirmed by Virgina Norfolk [phone]), editor Elita Quick ([id number])  on 04/11/2021 9:34:57 AM   I have reviewed the labs performed to date as well as medications administered while in observation.  Recent changes in the last 24 hours include ongoing agitation requiring restraints, currently being treated with oral psychiatric medication, for the last 12 hours, but did require IM medication prior to that.  Per note from psychiatric provider yesterday they plan on attempting to treat him with oral medications primarily.  They did not recommend any other medication changes at this time.  Plan  Current plan is for psychiatric hospitalization. Patient is under full IVC at this time.   Mancel Bale, MD 04/13/21 [phone]

## 2021-04-13 NOTE — Progress Notes (Addendum)
ADDENDUM  Per [facility] AC/RN, no appropriate beds at this time. Patient has been referred out and CSW will continue to follow disposition.   Signed:  Corky Crafts, MSW, [place], LCASA 04/13/2021 1:12 PM  Patient [facility] been [facility] to [facility] via secure chat to review for potential admission. Patient meets inpatient criteria per Assunta Found, NP.   Situation ongoing, CSW will continue to monitor progress.    Signed:  Corky Crafts, MSW, [place], LCASA 04/13/2021 9:32 AM

## 2021-04-13 NOTE — ED Notes (Signed)
RN removed ankle cuffs. Discussed with pt that cuffs can stay off if he remains cooperative, does not threaten staff, and does not attempt to harm or attack staff or self. Pt acknowledged this and stated his agreement.

## 2021-04-13 NOTE — ED Notes (Addendum)
Pt calling out to staff from bed. Stated that he needed to urinate. RN assisted pt with using urinal in bed. Not enough urine output to use for the ordered urinalysis or rapid urine drug screen.

## 2021-04-13 NOTE — Progress Notes (Addendum)
ADDENDUM  Patient declined by [facility] at this time. CSW will continue to search for placement.   Signed:  Corky Crafts, MSW, [place], LCASA 04/13/2021 3:04 PM  No appropriate beds available at [facility], Patient referred out at this time. See list below for facilities patient was referred.   Destination  Service Provider Address Phone Fax  [facility]  [address], [place] [address] [phone]  [facility]  [address] [place] [address] [phone]  [facility]  [address] [place] [address] [phone]  [facility]  [address] [place], [place] [address] [phone]  [facility] [address] [place]  [address], [place] [address] [phone]  [facility]  [address] [place] [address] [phone]  [facility]  [address] [place], [place] [address] [phone]  [facility]  [address] [place] [address] [phone]  [facility]  [address] [place] [address] [place] [address] [phone]  [facility]  [address] [place] [address] [phone]  [facility]  [address] [place], [place] [address] [phone]  [facility]  [address], [place] [address] [phone]  [facility]  [address] [place] [address] [phone]  [facility]  [address] [place] [address] [phone]  [facility]  [address], [place] [address] [phone]  [facility]  [address] [place] [address]  [phone]  [facility]  [address], [place] [address] [phone]  [facility]  [address], [place] [address] [phone]  [facility]  [address], [place] [address] [phone]  [facility]  [address] [place] [address] [phone]  [facility]  [address], [place] [address] [phone]  [facility]  [address], [place] [address] [phone]  [facility]  [address] [place], [place] [address] [phone]  [facility]  [address], [place] [address] [phone]  [facility]  [address], [place] [address] [phone]  [facility] [address] [place] [address] [phone]  [facility]  [address] [place] [address] [phone]  [facility]  [address] [place] [address] [phone]  [facility]  [address] [place] [address] [phone]  [facility]  [address] [place] [address] [phone]   Signed:  Corky Crafts, MSW, [place], Bridget Hartshorn 04/13/2021 2:29 PM

## 2021-04-13 NOTE — ED Notes (Signed)
Pt repositioned in bed by this RN and clinical sitter

## 2021-04-13 NOTE — ED Notes (Signed)
Pt urinary incontinent in bed. Pt cleaned, purple scrub pants changed, and linens changed by this RN and Ecologist.

## 2021-04-13 NOTE — ED Notes (Signed)
Pt is in shower 

## 2021-04-13 NOTE — BH Assessment (Addendum)
Disposition:   CSW noted at 1426 no appropriate beds available at [facility], Patient referred out to other hospitals today.    Disposition Counselor followed back up with [facility] AC Synthia Innocent, RN) @ 2009 to inquire about any changes regarding bed capacity at [facility] via secure chat.    Disposition CSW/Counselor will continue to seek bed placement.

## 2021-04-14 NOTE — ED Provider Notes (Signed)
Emergency Medicine Observation Re-evaluation Note  Stephen Delacruz is a 65 y.o. male, seen on rounds today.  Pt initially presented to the ED for complaints of Trauma and Aggressive Behavior Currently, the patient is watching TV in his room, no concerns at this tim.  Physical Exam  BP (!) 163/93 (BP Location: Left Arm)   Pulse 96   Temp 98 F (36.7 C) (Oral)   Resp 18   SpO2 96%  Physical Exam General: well-appearing Cardiac: normal rate Lungs: respirations even, unlabored Psych: Expressing concern for things "popping off around the world - [place], [place], even here in the [place]", appears uneasy. Not currently responding to internal stimuli  ED Course / MDM  EKG:EKG Interpretation  Date/Time:  Sunday April 10 2021 20:49:25 EDT Ventricular Rate:  107 PR Interval:  176 QRS Duration: 85 QT Interval:  320 QTC Calculation: 427 R Axis:   98 Text Interpretation: Sinus tachycardia Right axis deviation Confirmed by Virgina Norfolk ([id number]) on 04/10/2021 9:35:19 PM Also confirmed by Virgina Norfolk [phone]), editor Elita Quick ([id number])  on 04/11/2021 9:34:57 AM   I have reviewed the labs performed to date as well as medications administered while in observation.  Recent changes in the last 24 hours include none.  Plan  Current plan is for inpatient treatment, awaiting placement. Patient is under full IVC at this time.   Paris Lore, PA-C 04/14/21 0920    Margarita Grizzle, MD 04/14/21 [phone]
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
## 2021-04-14 NOTE — ED Notes (Signed)
PT Reports feeling comfortable for discharge, IVC Papers resent for discontinuation. Dr Madilyn Hook at bedside. Pt provided belongings and bus pass, no further questions at this time. NADN.

## 2021-04-14 NOTE — ED Provider Notes (Signed)
Patient has been psych cleared with recommendation to rescind IVC.  On eval pt is calm and appropriate.  Denies SI/HI.  IVC rescinded.    Tilden Fossa, MD 04/14/21 [phone]

## 2021-04-14 NOTE — ED Notes (Signed)
Pt provided bus ticket.

## 2021-04-18 ENCOUNTER — Ambulatory Visit (HOSPITAL_COMMUNITY)
Admission: EM | Admit: 2021-04-18 | Discharge: 2021-04-18 | Disposition: A | Discharge status: Home / Self Care | Payer: Medicare Other | Attending: Family | Admitting: Family

## 2021-04-18 ENCOUNTER — Other Ambulatory Visit: Payer: Self-pay

## 2021-04-18 DIAGNOSIS — Z5900 Homelessness unspecified: Secondary | ICD-10-CM | POA: Diagnosis not present

## 2021-04-18 DIAGNOSIS — F3162 Bipolar disorder, current episode mixed, moderate: Secondary | ICD-10-CM | POA: Diagnosis present

## 2021-04-18 DIAGNOSIS — F4310 Post-traumatic stress disorder, unspecified: Secondary | ICD-10-CM | POA: Insufficient documentation
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
## 2021-04-18 NOTE — Discharge Instructions (Signed)
Take all medications as prescribed. Keep all follow-up appointments as scheduled.  Do not consume alcohol or use illegal drugs while on prescription medications. Report any adverse effects from your medications to your primary care provider promptly.  In the event of recurrent symptoms or worsening symptoms, call 911, a crisis hotline, or go to the nearest emergency department for evaluation.   

## 2021-06-11 ENCOUNTER — Emergency Department (HOSPITAL_COMMUNITY)
Admission: EM | Admit: 2021-06-11 | Discharge: 2021-06-15 | Disposition: A | Discharge status: Home / Self Care | Payer: Medicare Other | Attending: Emergency Medicine | Admitting: Emergency Medicine

## 2021-06-11 ENCOUNTER — Ambulatory Visit (INDEPENDENT_AMBULATORY_CARE_PROVIDER_SITE_OTHER)
Admission: EM | Admit: 2021-06-11 | Discharge: 2021-06-11 | Disposition: A | Discharge status: Other Acute Inpatient Hospital | Payer: Medicare Other | Source: Home / Self Care

## 2021-06-11 ENCOUNTER — Other Ambulatory Visit: Payer: Self-pay

## 2021-06-11 DIAGNOSIS — R45851 Suicidal ideations: Secondary | ICD-10-CM | POA: Insufficient documentation

## 2021-06-11 DIAGNOSIS — Z20822 Contact with and (suspected) exposure to covid-19: Secondary | ICD-10-CM | POA: Insufficient documentation

## 2021-06-11 DIAGNOSIS — F1220 Cannabis dependence, uncomplicated: Secondary | ICD-10-CM | POA: Diagnosis not present

## 2021-06-11 DIAGNOSIS — I10 Essential (primary) hypertension: Secondary | ICD-10-CM | POA: Insufficient documentation

## 2021-06-11 DIAGNOSIS — F19150 Other psychoactive substance abuse with psychoactive substance-induced psychotic disorder with delusions: Secondary | ICD-10-CM | POA: Insufficient documentation

## 2021-06-11 DIAGNOSIS — F99 Mental disorder, not otherwise specified: Secondary | ICD-10-CM

## 2021-06-11 DIAGNOSIS — Z046 Encounter for general psychiatric examination, requested by authority: Secondary | ICD-10-CM | POA: Diagnosis present

## 2021-06-11 DIAGNOSIS — F250 Schizoaffective disorder, bipolar type: Secondary | ICD-10-CM

## 2021-06-11 DIAGNOSIS — F19950 Other psychoactive substance use, unspecified with psychoactive substance-induced psychotic disorder with delusions: Secondary | ICD-10-CM | POA: Diagnosis present

## 2021-06-11 DIAGNOSIS — R45850 Homicidal ideations: Secondary | ICD-10-CM | POA: Insufficient documentation

## 2021-06-11 DIAGNOSIS — F10139 Alcohol abuse with withdrawal, unspecified: Secondary | ICD-10-CM | POA: Insufficient documentation

## 2021-06-11 DIAGNOSIS — F17210 Nicotine dependence, cigarettes, uncomplicated: Secondary | ICD-10-CM | POA: Insufficient documentation

## 2021-06-11 DIAGNOSIS — Z7901 Long term (current) use of anticoagulants: Secondary | ICD-10-CM | POA: Insufficient documentation

## 2021-06-11 DIAGNOSIS — Z5900 Homelessness unspecified: Secondary | ICD-10-CM | POA: Insufficient documentation

## 2021-06-11 DIAGNOSIS — R4689 Other symptoms and signs involving appearance and behavior: Secondary | ICD-10-CM | POA: Diagnosis present

## 2021-06-11 DIAGNOSIS — Z79899 Other long term (current) drug therapy: Secondary | ICD-10-CM | POA: Insufficient documentation

## 2021-06-11 LAB — COMPREHENSIVE METABOLIC PANEL
ALT: 22 U/L (ref 0–44)
AST: 24 U/L (ref 15–41)
Albumin: 3.6 g/dL (ref 3.5–5.0)
Alkaline Phosphatase: 86 U/L (ref 38–126)
Anion gap: 5 (ref 5–15)
BUN: 18 mg/dL (ref 8–23)
CO2: 24 mmol/L (ref 22–32)
Calcium: 8.7 mg/dL — ABNORMAL LOW (ref 8.9–10.3)
Chloride: 108 mmol/L (ref 98–111)
Creatinine, Ser: 0.87 mg/dL (ref 0.61–1.24)
GFR, Estimated: >60 mL/min (ref >60)
Glucose, Bld: 114 mg/dL — ABNORMAL HIGH (ref 70–99)
Potassium: 3.8 mmol/L (ref 3.5–5.1)
Sodium: 137 mmol/L (ref 135–145)
Total Bilirubin: 0.7 mg/dL (ref 0.3–1.2)
Total Protein: 6.3 g/dL — ABNORMAL LOW (ref 6.5–8.1)

## 2021-06-11 LAB — CBC WITH DIFFERENTIAL/PLATELET
Abs Immature Granulocytes: 0.03 10*3/uL (ref 0.00–0.07)
Basophils Absolute: 0.1 10*3/uL (ref 0.0–0.1)
Basophils Relative: 1 %
Eosinophils Absolute: 0.7 10*3/uL — ABNORMAL HIGH (ref 0.0–0.5)
Eosinophils Relative: 8 %
HCT: 39.6 % (ref 39.0–52.0)
Hemoglobin: 13.2 g/dL (ref 13.0–17.0)
Immature Granulocytes: 0 %
Lymphocytes Relative: 28 %
Lymphs Abs: 2.6 10*3/uL (ref 0.7–4.0)
MCH: 31.8 pg (ref 26.0–34.0)
MCHC: 33.3 g/dL (ref 30.0–36.0)
MCV: 95.4 fL (ref 80.0–100.0)
Monocytes Absolute: 0.8 10*3/uL (ref 0.1–1.0)
Monocytes Relative: 9 %
Neutro Abs: 5.3 10*3/uL (ref 1.7–7.7)
Neutrophils Relative %: 54 %
Platelets: 369 10*3/uL (ref 150–400)
RBC: 4.15 MIL/uL — ABNORMAL LOW (ref 4.22–5.81)
RDW: 13.6 % (ref 11.5–15.5)
WBC: 9.6 10*3/uL (ref 4.0–10.5)
nRBC: 0 % (ref 0.0–0.2)

## 2021-06-11 LAB — ETHANOL: Alcohol, Ethyl (B): <10 mg/dL (ref <10)

## 2021-06-11 MED ORDER — OLANZAPINE 5 MG PO TABS
15.0000 mg | ORAL_TABLET | Freq: Every day | ORAL | Status: DC
  Administered 2021-06-11 – 2021-06-13 (×3): 15 mg via ORAL
  Filled 2021-06-11 (×3): qty 3
  Filled 2021-06-11: qty 1

## 2021-06-11 MED ORDER — ACETAMINOPHEN 325 MG PO TABS
650.0000 mg | ORAL_TABLET | Freq: Four times a day (QID) | ORAL | Status: DC | PRN
  Administered 2021-06-13 – 2021-06-15 (×3): 650 mg via ORAL
  Filled 2021-06-11 (×3): qty 2

## 2021-06-11 MED ORDER — AMLODIPINE BESYLATE 5 MG PO TABS
10.0000 mg | ORAL_TABLET | Freq: Every day | ORAL | Status: DC
  Administered 2021-06-12 – 2021-06-15 (×4): 10 mg via ORAL
  Filled 2021-06-11 (×4): qty 2

## 2021-06-11 MED ORDER — OXCARBAZEPINE 150 MG PO TABS
150.0000 mg | ORAL_TABLET | Freq: Two times a day (BID) | ORAL | Status: DC
  Administered 2021-06-11 – 2021-06-15 (×5): 150 mg via ORAL
  Filled 2021-06-11 (×9): qty 1

## 2021-06-11 NOTE — ED Notes (Signed)
Report called to Charge Nurse Pattricia Boss, [facility].   Pt HI.  Pending transfer to [facility] via [employer] transport.

## 2021-06-11 NOTE — Discharge Instructions (Addendum)
Patient to be transferred to [name] ED for safety monitoring/stabilization until patient can be placed for inpatient psychiatric treatment.
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
## 2021-06-11 NOTE — ED Provider Notes (Signed)
Behavioral Health Urgent Care Medical Screening Exam  Patient Name: Stephen Delacruz MRN: [id number] Date of Evaluation: 06/11/21 Chief Complaint:  Suicidal, Homicidal  Diagnosis:  Final diagnoses:  Schizoaffective disorder, bipolar type (HCC)    Disposition: Based on patient's current presentation and my personal assessment of the patient, believe that the patient is a serious threat to himself and others and recommend inpatient psychiatric treatment for the patient.  Due to patient's current aggressive behavior, patient is not appropriate for the [facility] continuous assessment unit.  Thus, will transfer the patient to [name] ED for further safety monitoring/stabilization while CSW seeks placement for inpatient psychiatric treatment for the patient.  Provider report given to Dr. Renaye Rakers and Dr. Renaye Rakers has agreed to accept the patient.  Notified Dr. Renaye Rakers that patient is currently voluntary and the patient begins to request to leave the ED, then the patient will need to be placed under IVC at that time. Dr. Renaye Rakers verbalizes understanding and agreement of this plan.  Patient states that he has not been taking any of his home medications for the past 2 weeks.  Recommend that the following medications be restarted in the emergency department:  -Amlodipine 10 mg p.o. daily for hypertension  -Gabapentin 100 mg p.o. 3 times daily for alcohol abuse/withdrawal symptoms  -Zyprexa Zydis disintegrating tablet 30 mg p.o. nightly for hypomanic episode of bipolar disorder/psychosis  -Trileptal 150 mg p.o. twice daily for mood stability  -Thiamine 100 mg p.o. daily for thiamine supplementation  -Multivitamin with minerals tablet: 1 tablet p.o. daily for vitamin mineral supplementation  History of Present illness: [name] is a 65 y.o. 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  Patient reports active SI with plan to "blow my fucking brains out".  Patient states that he has access to many firearms (patient states that he owns at least 200 guns including multiple machine guns and bazookas).  Patient also endorses active HI directed towards "anybody that tries to get in my way" with plan to "blow them the fuck up", referring to shooting somebody with a firearm.  Patient states "if anybody tries to get in my way, they're dead".  Patient is hostile and verbally aggressive and making serious verbal threats towards [facility] staff.  Per nursing staff, patient stated to TTS counselor "I'll kill you and kill everyone in here".  Patient reports that he had thoughts of wanting to kill himself last night and earlier today but he states that he decided not to end decided to come to [facility] to try and get some help.  Patient denies any recent self-injurious behavior via intentionally cutting or burning himself.  Patient denies AVH currently on exam.  Patient states that he has not slept for the past 3 weeks.  Patient reports that he has been homeless for the past month and slept in a graveyard last night.  Patient states that he has had "multiple threats on my life" recently.  Patient is significantly paranoid, psychotic, manic, and delusional on exam.  Patient reports drinking "as much as I can" of alcohol daily, but patient refuses to provide further details regarding quantity of alcohol use or when he last drank alcohol.  Patient denies any history of alcohol withdrawal symptoms or seizures.  Patient states that he uses cocaine and acid on a daily basis, but patient refuses to provide details regarding  quantity of use or how long he has been using.  Patient also states that he uses peyote, but refuses to provide further details regarding this.  Patient states that yesterday on 06/10/2021, he used "4 hits of windowpane acid, smoked an 8 ball of  crystal meth, snorted an 8 ball of cocaine, and smoked 5 8 balls of opium".  Patient reports smoking 5 packs/day since the age of 65 years old.  Patient also states that he is in the [place] mob and that he has personally killed 1 million people.  Patient also states that he makes $1 billion per day and that he owns a jet.  Patient states that he is currently in the [employer] and that he is about to be promoted to general [employer].  Per chart review, patient was admitted to [facility] from 02/21/2021 to 02/23/2021 and was discharged on gabapentin 100 mg p.o. 3 times daily, Zyprexa Zydis 20 mg disintegrating tablet at bedtime, Zyprexa Zydis disintegrating tablet 10 mg 3 times daily as needed, and Trileptal 150 mg twice daily.  Per chart review, it appears that patient's Zyprexa was changed to 30 mg p.o. nightly within the past few months.  Chart review shows that patient has also presented to the emergency department multiple times for psychiatric reasons since this behavioral health hospital admission mentioned above.  Patient denies any additional psychiatric hospitalizations since the behavioral health hospital admission mentioned above.  Patient states that he has not been taking any of his medications for at least the past 2 weeks.  Psychiatric Specialty Exam  Presentation  General Appearance:Disheveled; Bizarre  Eye Contact:Fair; Fleeting  Speech:Pressured  Speech Volume:Increased  Handedness:Left   Mood and Affect  Mood:Irritable  Affect:Congruent; Labile; Inappropriate   Thought Process  Thought Processes:Disorganized  Descriptions of Associations:Tangential  Orientation:Full (Time, Place and Person)  Thought Content:Illogical; Tangential; Delusions; Scattered; Paranoid Ideation  Diagnosis of Schizophrenia or Schizoaffective disorder in past: Yes  Duration of Psychotic Symptoms: Greater than six months  Hallucinations:None "last night I saw something coming at  me"  Ideas of Reference:Delusions; Paranoia  Suicidal Thoughts:Yes, Active (See HPI for details) With Intent; With Plan; With Means to Carry Out; With Access to Means Without Intent; Without Plan  Homicidal Thoughts:Yes, Active (See HPI for details.) With Intent; With Plan; With Means to Carry Out; With Access to Means   Sensorium  Memory:Immediate Fair; Recent Fair; Remote Fair  Judgment:Poor  Insight:Poor   Executive Functions  Concentration:Fair  Attention Span:Fair  Recall:Fair  Fund of Knowledge:Fair  Language:Fair   Psychomotor Activity  Psychomotor Activity:Normal   Assets  Assets:Communication Skills; Financial Resources/Insurance; Leisure Time; Physical Health   Sleep  Sleep:Poor  Number of hours:  No data recorded    Physical Exam: Physical Exam Vitals reviewed.  Constitutional:      Appearance: He is not ill-appearing, toxic-appearing or diaphoretic.  HENT:     Head: Normocephalic and atraumatic.     Right Ear: External ear normal.     Left Ear: External ear normal.     Nose: Nose normal.  Eyes:     General:        Right eye: No discharge.        Left eye: No discharge.     Conjunctiva/sclera: Conjunctivae normal.  Cardiovascular:     Rate and Rhythm: Normal rate.  Pulmonary:     Effort: Pulmonary effort is normal. No respiratory distress.  Musculoskeletal:        General: Normal  range of motion.     Cervical back: Normal range of motion.  Neurological:     General: No focal deficit present.     Mental Status: He is alert and oriented to person, place, and time.     Comments: No tremor noted.   Psychiatric:        Attention and Perception: He does not perceive auditory or visual hallucinations.        Speech: Speech is rapid and pressured.        Behavior: Behavior is agitated and aggressive. Behavior is not slowed or withdrawn.        Thought Content: Thought content is paranoid and delusional. Thought content includes  homicidal and suicidal ideation. Thought content includes homicidal and suicidal plan.     Comments: Mood is irritable with congruent, labile, and inappropriate affect.    Review of Systems  Constitutional:  Positive for malaise/fatigue. Negative for chills, diaphoresis, fever and weight loss.  HENT:  Negative for congestion.   Respiratory:  Negative for cough and shortness of breath.   Cardiovascular:  Negative for chest pain and palpitations.  Gastrointestinal:  Negative for abdominal pain, constipation, diarrhea, nausea and vomiting.  Musculoskeletal:  Negative for joint pain and myalgias.  Neurological:  Negative for dizziness, tremors, seizures and headaches.  Psychiatric/Behavioral:  Positive for substance abuse and suicidal ideas. Negative for depression and hallucinations. The patient has insomnia.   All other systems reviewed and are negative.  Vitals: Blood pressure (!) 163/90, pulse (!) 109, temperature 99.5 F (37.5 C), resp. rate 18, SpO2 98 %. There is no height or weight on file to calculate BMI.  Musculoskeletal: Strength & Muscle Tone: within normal limits Gait & Station: normal Patient leans: N/A   [facility] MSE Discharge Disposition for Follow up and Recommendations: Based on my evaluation, the patient does not appear to have an emergency medical condition. Patient is psychotic, delusional, paranoid, and exhibiting signs of mania.  Patient is also exhibiting severely aggressive behavior.  Based on patient's current presentation and my personal assessment of the patient, believe that the patient is a serious threat to himself and others and recommend inpatient psychiatric treatment for the patient.  Due to patient's current aggressive behavior, patient is not appropriate for the [facility] continuous assessment unit.  Thus, will transfer the patient to [name] ED for further safety monitoring/stabilization while CSW seeks placement for inpatient psychiatric treatment for the  patient.  Provider report given to Dr. Renaye Rakers and Dr. Renaye Rakers has agreed to accept the patient.  Notified Dr. Renaye Rakers that patient is currently voluntary and the patient begins to request to leave the ED, then the patient will need to be placed under IVC at that time. Dr. Renaye Rakers verbalizes understanding and agreement of this plan.  Patient states that he has not been taking any of his home medications for the past 2 weeks.  Recommend that the following medications be restarted in the emergency department:  -Amlodipine 10 mg p.o. daily for hypertension  -Gabapentin 100 mg p.o. 3 times daily for alcohol abuse/withdrawal symptoms  -Zyprexa Zydis disintegrating tablet 30 mg p.o. nightly for hypomanic episode of bipolar disorder/psychosis  -Trileptal 150 mg p.o. twice daily for mood stability  -Thiamine 100 mg p.o. daily for thiamine supplementation  -Multivitamin with minerals tablet: 1 tablet p.o. daily for vitamin mineral supplementation   Jaclyn Shaggy, PA-C 06/11/2021, 8:12 PM

## 2021-06-11 NOTE — BH Assessment (Signed)
Patient presents to [facility] requeste
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
## 2021-06-11 NOTE — ED Notes (Signed)
Pt is refusing to answer questions.

## 2021-06-11 NOTE — BH Assessment (Signed)
Patient presents to [facility] via [employer] reporting SI / w plans to blow his brains out , HI with plans to kill everybody even nursing staff . 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 Patient reports he has not slept in 3 weeks. Patient reports using  Acid, 8 Ball of Meth, Cocaine , Opium , and drinking 2 1/5 ths of liquor .Patient reports smoking 5 packs a cigarettes a day.  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 Patient verbal threatened staff and was aggressive during triage. Patient was transferred to [facility].

## 2021-06-12 LAB — RAPID URINE DRUG SCREEN, HOSP PERFORMED
Amphetamines: NOT DETECTED
Barbiturates: NOT DETECTED
Benzodiazepines: NOT DETECTED
Cocaine: NOT DETECTED
Opiates: NOT DETECTED
Tetrahydrocannabinol: POSITIVE — AB

## 2021-06-12 LAB — RESP PANEL BY RT-PCR (FLU A&B, COVID) ARPGX2
Influenza A by PCR: NEGATIVE
Influenza B by PCR: NEGATIVE
SARS Coronavirus 2 by RT PCR: NEGATIVE

## 2021-06-12 MED ORDER — LORAZEPAM 2 MG/ML IJ SOLN
2.0000 mg | Freq: Once | INTRAMUSCULAR | Status: AC
  Administered 2021-06-12: 2 mg via INTRAMUSCULAR
  Filled 2021-06-12: qty 1

## 2021-06-12 MED ORDER — STERILE WATER FOR INJECTION IJ SOLN
INTRAMUSCULAR | Status: AC
  Filled 2021-06-12: qty 10

## 2021-06-12 MED ORDER — DIPHENHYDRAMINE HCL 50 MG/ML IJ SOLN
25.0000 mg | Freq: Two times a day (BID) | INTRAMUSCULAR | Status: DC | PRN
  Administered 2021-06-13: 25 mg via INTRAMUSCULAR
  Filled 2021-06-12: qty 1

## 2021-06-12 MED ORDER — LORAZEPAM 2 MG/ML IJ SOLN
1.0000 mg | Freq: Two times a day (BID) | INTRAMUSCULAR | Status: DC | PRN
  Administered 2021-06-13: 1 mg via INTRAMUSCULAR
  Filled 2021-06-12: qty 1

## 2021-06-12 MED ORDER — OLANZAPINE 10 MG IM SOLR
10.0000 mg | Freq: Once | INTRAMUSCULAR | Status: AC
  Administered 2021-06-12: 10 mg via INTRAMUSCULAR
  Filled 2021-06-12: qty 10

## 2021-06-12 MED ORDER — HALOPERIDOL LACTATE 5 MG/ML IJ SOLN
10.0000 mg | Freq: Two times a day (BID) | INTRAMUSCULAR | Status: DC | PRN
  Administered 2021-06-13 – 2021-06-14 (×3): 10 mg via INTRAMUSCULAR
  Filled 2021-06-12 (×4): qty 2

## 2021-06-12 MED ORDER — NICOTINE 21 MG/24HR TD PT24
21.0000 mg | MEDICATED_PATCH | Freq: Once | TRANSDERMAL | Status: AC
  Administered 2021-06-12: 21 mg via TRANSDERMAL
  Filled 2021-06-12: qty 1

## 2021-06-12 NOTE — ED Notes (Signed)
Pt brought back to room escorted by security. Pt screaming and yelling obscenities.

## 2021-06-12 NOTE — ED Notes (Signed)
Pt remains asleep at this time 

## 2021-06-12 NOTE — ED Provider Notes (Signed)
Emergency Medicine Observation Re-evaluation Note  Stephen Delacruz is a 65 y.o. male, seen on rounds today.  Pt initially presented to the ED for complaints of Aggressive Behavior Currently, the patient is sleeping.  Physical Exam  BP (!) 169/96   Pulse (!) 55   Temp 97.8 F (36.6 C) (Oral)   Resp 16   SpO2 98%  Physical Exam Vitals and nursing note reviewed.  Constitutional:      Appearance: He is not ill-appearing.  HENT:     Head: Normocephalic and atraumatic.  Eyes:     Conjunctiva/sclera: Conjunctivae normal.  Cardiovascular:     Rate and Rhythm: Normal rate and regular rhythm.  Pulmonary:     Effort: Pulmonary effort is normal.     Breath sounds: Normal breath sounds.  Skin:    General: Skin is warm and dry.     Coloration: Skin is not jaundiced.  Neurological:     Mental Status: He is alert.    ED Course / MDM  EKG:   I have reviewed the labs performed to date as well as medications administered while in observation.  Recent changes in the last 24 hours include N/A.  Plan  Current plan: pending placement. Meets inpatient criteria.  Patient is under full IVC at this time.   Tanda Rockers, PA-C 06/12/21 [id number]    Linwood Dibbles, MD 06/14/21 [phone]

## 2021-06-12 NOTE — ED Notes (Signed)
Pt sleeping comfortably. Respirations even and unlabored.

## 2021-06-12 NOTE — ED Provider Notes (Addendum)
Patient has just escaped from the emergency department.  He became very irritated and hostile when he was awoken for TTS consult.  [facility] Police Department is reviewing security cameras and will attempt to recover the patient.   Koleen Distance, MD 06/12/21 1411  14:23: Patient was found sitting in the orange pod.  He was brought back to the purple pod.  I will medicate him with IM medication.   Koleen Distance, MD 06/12/21 [phone]

## 2021-06-12 NOTE — ED Notes (Signed)
Pt lying in bed watching TV. 
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
## 2021-06-12 NOTE — Progress Notes (Signed)
CSW spoke with [facility] who stated that they did not have records of this pt in their system. They stated that they are unable to see if he is connected at another [place] facility and states that CSW would need to call each individual center in this state or possibly in other states to check. She states pt needs to be asked if he knows what [facility] he has been to or is connected with.     Ruthann Cancer MSW, LCSW Clincal Social Worker  [facility]

## 2021-06-12 NOTE — ED Notes (Signed)
Verbal report received from Crystal B. RN 

## 2021-06-12 NOTE — ED Notes (Addendum)
Pt becoming agitated, yelling and cursing at this RN. Pt upset after this RN told him he could not have his phone out of his belongings, that his belonging were locked up in a locker. Pt then proceeded to leave unit. Security paged. EDP notified.

## 2021-06-13 DIAGNOSIS — F250 Schizoaffective disorder, bipolar type: Secondary | ICD-10-CM

## 2021-06-13 MED ORDER — STERILE WATER FOR INJECTION IJ SOLN
INTRAMUSCULAR | Status: AC
  Filled 2021-06-13: qty 10

## 2021-06-13 MED ORDER — LORAZEPAM 2 MG/ML IJ SOLN
1.0000 mg | Freq: Four times a day (QID) | INTRAMUSCULAR | Status: DC | PRN
  Administered 2021-06-13 – 2021-06-14 (×2): 1 mg via INTRAMUSCULAR
  Filled 2021-06-13 (×2): qty 1

## 2021-06-13 MED ORDER — ZIPRASIDONE MESYLATE 20 MG IM SOLR
10.0000 mg | Freq: Once | INTRAMUSCULAR | Status: AC
  Administered 2021-06-13: 10 mg via INTRAMUSCULAR
  Filled 2021-06-13: qty 20

## 2021-06-13 MED ORDER — DIPHENHYDRAMINE HCL 50 MG/ML IJ SOLN
50.0000 mg | Freq: Four times a day (QID) | INTRAMUSCULAR | Status: DC | PRN
  Administered 2021-06-13 – 2021-06-14 (×2): 50 mg via INTRAMUSCULAR
  Filled 2021-06-13 (×2): qty 1

## 2021-06-13 NOTE — ED Notes (Signed)
[id number]: Pt was walking constantly in the unit and was demanding multiple things. Pressure speech and escalation in behavior noted. Pt was cussing and snaps when he does not get what he wants. Safety precautions maintained. Pt is hard to redirect at this time. PRN meds offered. Pt took it willingly. IM Haldol, IM Benadryl and IM Ativan given at this time. Pt is in his room right now still talking about different things. Will continue to monitor.

## 2021-06-13 NOTE — ED Notes (Signed)
Restraints removed at this time. Pt is sleeping currently. Will continue to monitor.

## 2021-06-13 NOTE — ED Notes (Signed)
Pt is awake and demanding for multiple things. Pt has been wanting to make a phone call, wanting to shower and was talking about different things. Pt has some pressure speech and flights of ideas. Demanding things one after the other. Pt was redirected multiple times. Pt is hard to redirect at times as he starts cussing staff when he doesn't get what he wants. Pacing the room continuously at this time. Pt took a shower and is now starting to demand multiple things again. Pt talks constantly. Safety precautions maintained. Will continue to monitor.

## 2021-06-13 NOTE — ED Notes (Signed)
Pt is up and ad lib at this time. Started cussing staff and wandering in other pts rooms. Pt redirected by staff multiple times. Safety precautions maintained. Will continue to monitor.

## 2021-06-13 NOTE — ED Notes (Signed)
Currently on bed with restraints on. Pt constantly talking and attempting to break out of restraints. Safety precautions maintained. Will continue to monitor.

## 2021-06-13 NOTE — ED Provider Notes (Signed)
Emergency Medicine Observation Re-evaluation Note  Stephen Delacruz is a 65 y.o. male, seen on rounds today.  Pt initially presented to the ED for complaints of Aggressive Behavior Currently, the patient is agitated, aggressive.  Throwing things in room  Physical Exam  BP 117/76 (BP Location: Right Arm)   Pulse (!) 101   Temp 98 F (36.7 C) (Oral)   Resp 18   SpO2 100%  Physical Exam General: Aggressive Cardiac/ Lungs: Speaks in full sentences ABD: No distension Psych: Agitated, aggressive  ED Course / MDM  EKG:EKG Interpretation  Date/Time:  Sunday June 12 2021 20:05:01 EDT Ventricular Rate:  79 PR Interval:  184 QRS Duration: 80 QT Interval:  336 QTC Calculation: 385 R Axis:   56 Text Interpretation: Normal sinus rhythm Nonspecific ST abnormality Abnormal ECG When compared with ECG of 04/10/2021, Nonspecific T wave abnormality is now present Confirmed by Dione Booze ([id number]) on 06/13/2021 12:14:09 AM  I have reviewed the labs performed to date as well as medications administered while in observation.  Recent changes in the last 24 hours include increased agitation and aggression  Plan  Current plan is for psych hold.  Patient with increased agitation, aggression.  Needed to be pulled out of bathroom due to aggressive behavior.  Nursing had already given Haldol, Ativan and Benadryl an hour and a half ago without relief  Orders for Geodon placed.  Per EKG without any prolonged QT interval Patient is under full IVC at this time.   [name], PA-C 06/13/21 0945    [name], MD 06/22/21 [phone]

## 2021-06-13 NOTE — ED Notes (Signed)
Pt out of room asking for meal tray to be heated up

## 2021-06-13 NOTE — ED Notes (Signed)
Bilateral ankle restraints removed at this time. Pt is resting but remains verbally abusive when awake. Currently in/out of sleeping. Will continue to monitor.

## 2021-06-13 NOTE — ED Notes (Signed)
Pt is now escalating walking fast and appears angry. Locked himself in another room and is difficult to redirect. Staff attempted to get pt out of the room multiple times. Pt blocked the door. Security notified. Pt opened the door and walked back to room cussing staff. MD notified.
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
## 2021-06-13 NOTE — Progress Notes (Signed)
Patient information has been sent to [facility] via secure chat to review for potential admission. Patient meets inpatient criteria per Assunta Found, NP.   Situation ongoing, CSW will continue to monitor progress.    Signed:  Damita Dunnings, MSW, LCSW-A  06/13/2021 10:20 AM

## 2021-06-13 NOTE — ED Notes (Signed)
Pt constantly screaming and cussing staff to take off restraints. Pt redirected multiple times regarding criteria for release. Safety precautions maintained. Will continue to monitor.

## 2021-06-13 NOTE — ED Notes (Signed)
Pr demonstrated criteria for release of restraints. Restraints removed at 2000 and patient calm and cooperative. Pt used the restroom and is currently eating.

## 2021-06-13 NOTE — ED Notes (Signed)
Pt ambulated to restroom at this time.

## 2021-06-13 NOTE — ED Notes (Signed)
Pt is upset that he is on restraints. Attempted to break out of restraints multiple times. Pt fed by staff for lunch. Ate 90% of lunch. Pt is hard to redirect at this time. Verbally agitated screaming to get him out of restraints while moving on bed a lot. Safety precautions maintained. RN told pt criteria for release. Pt is still agitated at this time. Will continue to monitor.

## 2021-06-13 NOTE — ED Notes (Addendum)
Pt continues to cuss and is still angry at this time. Currently in front of the nursing station with pressure speech. Cussing security and talking incoherently from one topic to another. Pt is hard to redirect. Constantly paces in the unit still at this time. Pt makes the gun gesture with his hands pointing at staff stating, "I'm going to kill you all. I'm going to shoot you all." 2 security officers on standby in the unit for safety purposes.

## 2021-06-13 NOTE — ED Notes (Signed)
Breakfast orders placed 

## 2021-06-13 NOTE — Progress Notes (Signed)
Patient has been referred out due to [facility] not having bed availability. Patient meets inpatient criteria per [facility] Rankin,NP. Patient referred to the following facilities: [facility]  [address], [place] [address] [phone]  [facility]  [address] [place] [address] [phone]  [facility] [address]  [address] [place] [address] [phone]  [facility]  [address] [place] [address] [phone]  [facility]  [address] [place], [place] [address] [phone]  [facility]  [address], [place] [address] [phone]  [facility]  [address] [place], [place] [address] [phone]  [facility]  [address] [place] [address] [phone]  [facility]  [address], [place] [address] [phone]  [facility]  [address], [place] [address] [phone]  [facility]  [address] [place] [address] [phone]  [facility]  [address] [place] [address] [phone]  [facility]  [address] [place] [address] [phone]  [facility]  [address], [place] [address] [phone]  [facility]  [address] [place], [place] [address] [phone]  [facility]  [address], [place] [address] [phone]  [facility]  [address], [place] [address] [phone]  [facility]  [address], [place] [address] [phone]    CSW will continue to monitor disposition.    Damita Dunnings, MSW, LCSW-A  2:25 PM 06/13/2021
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
## 2021-06-13 NOTE — ED Notes (Signed)
Pt started going to other pts rooms. Pt was hard to redirect as pt behavior escalating. Yelling and cussing at staff. Pt was medicated twice with IM PRN meds for agitation. Pt threatened to kill staff and states he is going to kill himself. Multiple security officers at bedside. 4 point gurney restraints started. Dr.Pfeiffer notified of events. Safety precautions maintained.

## 2021-06-13 NOTE — ED Notes (Signed)
Pt out of room talking to staff. Stating he is going to send money to everyone for Christmas. Asked this RN to go out with him when he gets out. Declined respectfully and he said he was going to keep asking and wear me down.

## 2021-06-13 NOTE — ED Notes (Addendum)
Pt is very disruptive, agitated, and verbally aggressive at this time. Woke up and started demanding multiple things again. Started pacing in the unit. Constantly cussing and screaming at staff when he does not get what he wants. Pt pulled the emergency button in his room and started threatening to kill staff. Pt took off his scrub shirt and went to room 49 where another pt was. Staff tried to redirect pt. Pt cusses staff back. Pt attempted to go to other pts rooms and is currently escalating in behavior. Security called for safety purposes. Pt started [employer]. Pt was placed again on 3 pt restraints. Dr.Wentz notified. Safety precautions maintained.

## 2021-06-13 NOTE — ED Notes (Signed)
Pt decided to lay down on bed in his room at this time. Lights were turned off and the tv volume lowered. Pt appears restless at this time. Continues to cuss staff. Safety precautions maintained. Will continue to monitor.

## 2021-06-13 NOTE — ED Notes (Signed)
Pt took meds willingly at this time. Safety precautions maintained. Will continue to monitor.

## 2021-06-14 MED ORDER — LORAZEPAM 2 MG/ML IJ SOLN
2.0000 mg | Freq: Four times a day (QID) | INTRAMUSCULAR | Status: DC | PRN
  Administered 2021-06-14 – 2021-06-15 (×2): 2 mg via INTRAMUSCULAR
  Filled 2021-06-14 (×2): qty 1
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
## 2021-06-14 NOTE — ED Notes (Signed)
Breakfast Orders placed 

## 2021-06-14 NOTE — ED Notes (Signed)
Pt agitated, attempting to escape in purple zone. Redirected to his room by staff.

## 2021-06-14 NOTE — ED Notes (Signed)
Pt approached nursing station asking to reheat his food that he had already eaten. When advised this was not allowed, pt became irate, smashing food and throwing it across the room and shouting at staff. See MAR for PRN medication administration

## 2021-06-14 NOTE — ED Notes (Signed)
Pt incontinent of urine. Pt assisted to restroom and provided new scrubs. Room cleaned and linens changed during this time

## 2021-06-14 NOTE — Progress Notes (Addendum)
No appropriate beds available at [facility], per [facility], requested that CSW refer out at this time.   Patient meets inpatient criteria per Assunta Found, NP.  Patient was referred to the following facilities:   Destination  Service Provider Address Phone [facility]  [address] [place] [address] [phone]  [facility]  [address] [place] [address] [phone]  [facility] [address]  [address], [place] [address] [phone]  [facility]  [address] [place] [address] [phone]  [facility]  [address] [place], [place] [address] [phone]  [facility]  [address], [place] [address] [phone]  [facility]  [address] [place], [place] [address] [phone]  [facility]  [address], [place] [address] [phone]  [facility]  [address] [place] [address] [phone]  [facility]  [address], [place] [address] [phone]  [facility]  [address] [place] [address] [phone]  [facility]  [address] [place] [address] [phone]  [facility]  [address], [place] [address] [phone]  [facility]  [address], [place] [address] [phone]  [facility]  [address] [place], [place] [address] [phone]  [facility]  [address] [place] [address] [phone]  [facility]   [address], [place] [address] [phone]  [facility]  [address], [place] [address] [phone]  [facility]  [address] [place] [address] [phone]    CSW will continue to monitor for disposition.  Penni Homans, MSW, LCSW 06/14/2021 2:34 PM

## 2021-06-14 NOTE — ED Notes (Signed)
Pt continuing to use profanities and call staff and other patients obscenities.

## 2021-06-14 NOTE — Progress Notes (Signed)
Pt under review at [facility].  Penni Homans, MSW, LCSW 06/14/2021 10:26 AM

## 2021-06-15 ENCOUNTER — Encounter (HOSPITAL_COMMUNITY): Payer: Self-pay | Admitting: Registered Nurse

## 2021-06-15 MED ORDER — OLANZAPINE 5 MG PO TBDP
5.0000 mg | ORAL_TABLET | Freq: Every morning | ORAL | Status: DC
  Administered 2021-06-15: 5 mg via ORAL
  Filled 2021-06-15: qty 1

## 2021-06-15 MED ORDER — OLANZAPINE 5 MG PO TABS: 5.0000 mg | ORAL_TABLET | Freq: Every morning | ORAL | 0 refills | Status: DC

## 2021-06-15 MED ORDER — NICOTINE 21 MG/24HR TD PT24
21.0000 mg | MEDICATED_PATCH | Freq: Once | TRANSDERMAL | Status: DC
  Administered 2021-06-15: 21 mg via TRANSDERMAL
  Filled 2021-06-15: qty 1

## 2021-06-15 MED ORDER — OXCARBAZEPINE 150 MG PO TABS
150.0000 mg | ORAL_TABLET | Freq: Two times a day (BID) | ORAL | 0 refills | Status: DC
Start: 2021-06-15 — End: 2022-09-19

## 2021-06-15 MED ORDER — AMLODIPINE BESYLATE 10 MG PO TABS: 10.0000 mg | ORAL_TABLET | Freq: Every day | ORAL | 0 refills | Status: DC

## 2021-06-15 MED ORDER — OLANZAPINE 15 MG PO TABS: 15.0000 mg | ORAL_TABLET | Freq: Every day | ORAL | 0 refills | Status: DC

## 2021-06-15 NOTE — Progress Notes (Signed)
Patient has been referred out due to no bed availability at [facility]. Patient meets inpatient criteria per [facility],NP. Patient referred to the following facilities:  [facility]  [address] [place] [address] [phone]  [facility]  [address] [place] [address] [phone]  [facility] [address]  [address], [place] [address] [phone]  [facility]  [address] [employer] [place] [employer] [place] [address] [phone]  [facility]  [address] [place], [place] [address] [phone]  [facility]  [address], [place] [address] [phone]  [facility]  [address] [place], [place] [address] [phone]  [facility]  [address] [place] [address] [phone]  [facility]  [address], [place] [address] [phone]  [facility]  [address] [place] [address] [phone]  [facility]  [address] [place] [address] [phone]  [facility]  [address], [place] [address] [phone]  [facility]  [address] [place] [address] [phone]  [facility]  [address], [place] [address] [phone]  [facility]  [address] [place], [place] [address] [phone]  [facility]  [address] [place] [address] [phone]  [facility]  [address] [place], [place] [address] [phone]  [facility]  [address] [place] [address] [phone]  [facility]  [address] [place] [address] [phone]    CSW will continue to monitor disposition.    Damita Dunnings, MSW, LCSW-A  10:19 AM 06/15/2021

## 2021-06-15 NOTE — ED Notes (Addendum)
Belongings given to pt. Valuables returned to pt. Givin Rx x4. Valuables envelope signed. IVC rescinded. Bus pass given. Pt and RN spoke with pt's sister on phone prior to d/c. VSS.
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
## 2021-06-15 NOTE — ED Notes (Signed)
Pt stepping outside the room, talks with security., redirectable

## 2021-06-15 NOTE — ED Notes (Signed)
Denies pain. Back pain feels better. Calmer. Decreased agitation. Up to b/r and shower.

## 2021-06-15 NOTE — ED Provider Notes (Signed)
Patient has been cleared by behavioral health for discharge home.  There were health nurse practitioner provided medication recommendations.  And they have been included in his discharge.  Follow-up as per behavioral health and social worker.   Vanetta Mulders, MD 06/15/21 1324

## 2021-06-15 NOTE — ED Notes (Signed)
Breakfast order placed ?

## 2021-06-15 NOTE — ED Notes (Signed)
TTS complete. Pt notified of sister return call.

## 2021-06-15 NOTE — Progress Notes (Signed)
..  Transition of Care [facility]) - Emergency Department Mini Assessment   Patient Details  Name: Stephen Delacruz MRN: [id number] Date of Birth: [birth year]  [facility] CM/SW Contact:    Jaretzy Lhommedieu C Tarpley-Carter, LCSWA Phone Number: 06/15/2021, 2:00 PM   Clinical Narrative: TOC CSW spoke with pt in regards to needs.  Pt is in need of shelter resources and transportation.  CSW provided pt shelter resources and inquired about his transportation needs.  Deshay Blumenfeld Tarpley-Carter, MSW, LCSW-A Pronouns:  She, Her, Hers                  [facility] ED Transitions of CareClinical Social Worker [email or web address] [phone]    ED Mini Assessment: What brought you to the Emergency Department? : Psychiatric Compliant  Barriers to Discharge: No Barriers Identified     Means of departure: Not know  Interventions which prevented an admission or readmission: Homeless Screening    Patient Contact and Communications       Contact Date: 06/15/21,     Contact Phone Number: Pt is in need of shelter resources and transportation.        Choice offered to / list presented to : NA  Admission diagnosis:  z04.6 Patient Active Problem List   Diagnosis Date Noted   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/2021   Aggressive behavior    Bipolar 1 disorder, mixed, severe (HCC) 02/21/2021   Essential hypertension    HTN (hypertension) 04/30/2015   Bipolar disorder, curr episode mixed, severe, with psychotic features (HCC) 04/22/2015   Alcohol use disorder, moderate, dependence (HCC) 04/22/2015   Cannabis use disorder, moderate, dependence (HCC) 04/22/2015   Cocaine use disorder, moderate, dependence (HCC) 04/22/2015   Schizoaffective disorder, bipolar type (HCC) 04/21/2015   Suicidal ideations 04/21/2015   PCP:  System, Provider Not In Pharmacy:   [id number] [place] [place] - [address] AT [facility] [address] [place] [address] Phone: [phone] Fax: [phone]

## 2021-06-15 NOTE — Discharge Instructions (Addendum)
Follow-up as per behavioral health and social worker.  30-day supply of your medications has been provided as requested by the behavioral health nurse practitioner.

## 2021-06-15 NOTE — ED Notes (Signed)
TTS in progress 

## 2021-06-16 ENCOUNTER — Other Ambulatory Visit: Payer: Self-pay

## 2021-06-16 ENCOUNTER — Encounter (HOSPITAL_COMMUNITY): Payer: Self-pay | Admitting: Emergency Medicine

## 2021-06-16 ENCOUNTER — Emergency Department (HOSPITAL_COMMUNITY): Payer: Medicare Other

## 2021-06-16 ENCOUNTER — Emergency Department (HOSPITAL_COMMUNITY)
Admission: EM | Admit: 2021-06-16 | Discharge: 2021-06-16 | Disposition: A | Discharge status: Home / Self Care | Payer: Medicare Other | Attending: Emergency Medicine | Admitting: Emergency Medicine

## 2021-06-16 DIAGNOSIS — W010XXA Fall on same level from slipping, tripping and stumbling without subsequent striking against object, initial encounter: Secondary | ICD-10-CM | POA: Diagnosis not present

## 2021-06-16 DIAGNOSIS — Z79899 Other long term (current) drug therapy: Secondary | ICD-10-CM | POA: Insufficient documentation

## 2021-06-16 DIAGNOSIS — M545 Low back pain, unspecified: Secondary | ICD-10-CM | POA: Diagnosis not present

## 2021-06-16 DIAGNOSIS — R000 Tachycardia, unspecified: Secondary | ICD-10-CM | POA: Diagnosis not present

## 2021-06-16 DIAGNOSIS — R0602 Shortness of breath: Secondary | ICD-10-CM | POA: Diagnosis not present

## 2021-06-16 DIAGNOSIS — F17210 Nicotine dependence, cigarettes, uncomplicated: Secondary | ICD-10-CM | POA: Diagnosis not present

## 2021-06-16 DIAGNOSIS — I10 Essential (primary) hypertension: Secondary | ICD-10-CM | POA: Diagnosis not present

## 2021-06-16 DIAGNOSIS — M79641 Pain in right hand: Secondary | ICD-10-CM | POA: Insufficient documentation

## 2021-06-16 DIAGNOSIS — R0789 Other chest pain: Secondary | ICD-10-CM | POA: Diagnosis present

## 2021-06-16 DIAGNOSIS — G8929 Other chronic pain: Secondary | ICD-10-CM | POA: Insufficient documentation

## 2021-06-16 DIAGNOSIS — W19XXXA Unspecified fall, initial encounter: Secondary | ICD-10-CM

## 2021-06-16 NOTE — ED Provider Notes (Signed)
MSE was initiated and I personally evaluated the patient and placed orders (if any) at  2:53 AM on June 16, 2021.  Patient to ED after mechanical fall earlier today. He was able to walk to the ED for evaluation. He complains of left chest wall pain that is worse with breathing.   Today's Vitals   06/16/21 0232 06/16/21 0233  BP:  (!) 146/85  Pulse:  (!) 115  Resp:  16  Temp:  98.4 F (36.9 C)  TempSrc:  Oral  SpO2:  96%  Weight: 85 kg   Height: 5\' 6"  (1.676 m)   PainSc: 7     Body mass index is 30.25 kg/m.  No bruising of chest wall Air movement to all fields.  FROM extremities.  Abdomen nontender  The patient appears stable so that the remainder of the MSE may be completed by another provider.   , PA-C 06/16/21 0255    06/18/21, MD 06/16/21 [phone]

## 2021-06-16 NOTE — ED Triage Notes (Signed)
Patient tripped and fell forward this evening , denies LOC /ambulatory , reports pain at lower back ;left ribcage and right hand .

## 2021-06-16 NOTE — Discharge Instructions (Addendum)
You can continue aspirin for discomfort. Suggest warm compresses to the sore areas.   Follow up with your doctor as needed for routine care.
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
## 2021-08-20 ENCOUNTER — Emergency Department (HOSPITAL_COMMUNITY)
Admission: EM | Admit: 2021-08-20 | Discharge: 2021-08-20 | Discharge status: Against Medical Advice | Payer: Medicare Other
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
## 2021-08-20 NOTE — ED Notes (Signed)
Pt stated he would die if he stayed here any longer. Pt left

## 2021-12-28 ENCOUNTER — Other Ambulatory Visit: Payer: Self-pay

## 2021-12-28 ENCOUNTER — Encounter (HOSPITAL_COMMUNITY): Payer: Self-pay | Admitting: Emergency Medicine

## 2021-12-28 ENCOUNTER — Emergency Department (HOSPITAL_COMMUNITY)
Admission: EM | Admit: 2021-12-28 | Discharge: 2021-12-28 | Disposition: A | Discharge status: Home / Self Care | Payer: Medicare Other | Attending: Emergency Medicine | Admitting: Emergency Medicine

## 2021-12-28 DIAGNOSIS — F209 Schizophrenia, unspecified: Secondary | ICD-10-CM | POA: Insufficient documentation

## 2021-12-28 DIAGNOSIS — Z20822 Contact with and (suspected) exposure to covid-19: Secondary | ICD-10-CM | POA: Diagnosis not present

## 2021-12-28 DIAGNOSIS — R45850 Homicidal ideations: Secondary | ICD-10-CM | POA: Insufficient documentation

## 2021-12-28 LAB — COMPREHENSIVE METABOLIC PANEL
ALT: 19 U/L (ref 0–44)
AST: 19 U/L (ref 15–41)
Albumin: 4.3 g/dL (ref 3.5–5.0)
Alkaline Phosphatase: 95 U/L (ref 38–126)
Anion gap: 9 (ref 5–15)
BUN: 20 mg/dL (ref 8–23)
CO2: 25 mmol/L (ref 22–32)
Calcium: 9.1 mg/dL (ref 8.9–10.3)
Chloride: 102 mmol/L (ref 98–111)
Creatinine, Ser: 0.99 mg/dL (ref 0.61–1.24)
GFR, Estimated: >60 mL/min (ref >60)
Glucose, Bld: 97 mg/dL (ref 70–99)
Potassium: 3.6 mmol/L (ref 3.5–5.1)
Sodium: 136 mmol/L (ref 135–145)
Total Bilirubin: 0.9 mg/dL (ref 0.3–1.2)
Total Protein: 7.4 g/dL (ref 6.5–8.1)

## 2021-12-28 LAB — CBC WITH DIFFERENTIAL/PLATELET
Abs Immature Granulocytes: 0.05 10*3/uL (ref 0.00–0.07)
Basophils Absolute: 0.1 10*3/uL (ref 0.0–0.1)
Basophils Relative: 0 %
Eosinophils Absolute: 0.2 10*3/uL (ref 0.0–0.5)
Eosinophils Relative: 2 %
HCT: 45.6 % (ref 39.0–52.0)
Hemoglobin: 15.1 g/dL (ref 13.0–17.0)
Immature Granulocytes: 0 %
Lymphocytes Relative: 20 %
Lymphs Abs: 2.3 10*3/uL (ref 0.7–4.0)
MCH: 32.3 pg (ref 26.0–34.0)
MCHC: 33.1 g/dL (ref 30.0–36.0)
MCV: 97.6 fL (ref 80.0–100.0)
Monocytes Absolute: 0.9 10*3/uL (ref 0.1–1.0)
Monocytes Relative: 8 %
Neutro Abs: 8.1 10*3/uL — ABNORMAL HIGH (ref 1.7–7.7)
Neutrophils Relative %: 70 %
Platelets: 445 10*3/uL — ABNORMAL HIGH (ref 150–400)
RBC: 4.67 MIL/uL (ref 4.22–5.81)
RDW: 13.9 % (ref 11.5–15.5)
WBC: 11.6 10*3/uL — ABNORMAL HIGH (ref 4.0–10.5)
nRBC: 0 % (ref 0.0–0.2)

## 2021-12-28 LAB — RESP PANEL BY RT-PCR (FLU A&B, COVID) ARPGX2
Influenza A by PCR: NEGATIVE
Influenza B by PCR: NEGATIVE
SARS Coronavirus 2 by RT PCR: NEGATIVE

## 2021-12-28 LAB — ETHANOL: Alcohol, Ethyl (B): <10 mg/dL (ref <10)
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
## 2021-12-29 LAB — 10-HYDROXYCARBAZEPINE: Triliptal/MTB(Oxcarbazepin): <1 ug/mL — ABNORMAL LOW (ref 10–35)

## 2022-06-19 ENCOUNTER — Encounter (HOSPITAL_COMMUNITY): Payer: Self-pay

## 2022-06-19 ENCOUNTER — Other Ambulatory Visit: Payer: Self-pay

## 2022-06-19 ENCOUNTER — Emergency Department (HOSPITAL_COMMUNITY)
Admission: EM | Admit: 2022-06-19 | Discharge: 2022-06-19 | Disposition: A | Discharge status: Home / Self Care | Payer: Medicare Other | Attending: Emergency Medicine | Admitting: Emergency Medicine

## 2022-06-19 ENCOUNTER — Emergency Department (HOSPITAL_COMMUNITY): Payer: Medicare Other

## 2022-06-19 DIAGNOSIS — Y9259 Other trade areas as the place of occurrence of the external cause: Secondary | ICD-10-CM | POA: Diagnosis not present

## 2022-06-19 DIAGNOSIS — Z5902 Unsheltered homelessness: Secondary | ICD-10-CM | POA: Diagnosis not present

## 2022-06-19 DIAGNOSIS — Z79899 Other long term (current) drug therapy: Secondary | ICD-10-CM | POA: Insufficient documentation

## 2022-06-19 DIAGNOSIS — S0990XA Unspecified injury of head, initial encounter: Secondary | ICD-10-CM | POA: Diagnosis not present

## 2022-06-19 DIAGNOSIS — R519 Headache, unspecified: Secondary | ICD-10-CM | POA: Diagnosis present

## 2022-06-19 DIAGNOSIS — I10 Essential (primary) hypertension: Secondary | ICD-10-CM | POA: Insufficient documentation

## 2022-06-19 MED ORDER — OLANZAPINE 5 MG PO TABS
15.0000 mg | ORAL_TABLET | Freq: Every day | ORAL | Status: DC
  Administered 2022-06-19: 15 mg via ORAL
  Filled 2022-06-19: qty 1

## 2022-06-19 MED ORDER — GABAPENTIN 100 MG PO CAPS
100.0000 mg | ORAL_CAPSULE | Freq: Three times a day (TID) | ORAL | Status: DC
  Administered 2022-06-19: 100 mg via ORAL
  Filled 2022-06-19: qty 1

## 2022-06-19 MED ORDER — AMLODIPINE BESYLATE 5 MG PO TABS
10.0000 mg | ORAL_TABLET | Freq: Every day | ORAL | Status: DC
  Administered 2022-06-19: 10 mg via ORAL
  Filled 2022-06-19: qty 2

## 2022-06-19 MED ORDER — ACETAMINOPHEN 325 MG PO TABS
650.0000 mg | ORAL_TABLET | Freq: Once | ORAL | Status: AC
  Administered 2022-06-19: 650 mg via ORAL
  Filled 2022-06-19: qty 2

## 2022-06-19 MED ORDER — OLANZAPINE 5 MG PO TABS: 5.0000 mg | ORAL_TABLET | Freq: Every morning | ORAL | Status: DC

## 2022-06-19 MED ORDER — IBUPROFEN 200 MG PO TABS
600.0000 mg | ORAL_TABLET | Freq: Once | ORAL | Status: AC
  Administered 2022-06-19: 600 mg via ORAL
  Filled 2022-06-19: qty 3
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
## 2022-06-30 ENCOUNTER — Other Ambulatory Visit: Payer: Self-pay

## 2022-06-30 ENCOUNTER — Encounter (HOSPITAL_COMMUNITY): Payer: Self-pay | Admitting: Emergency Medicine

## 2022-06-30 ENCOUNTER — Emergency Department (HOSPITAL_COMMUNITY)
Admission: EM | Admit: 2022-06-30 | Discharge: 2022-07-01 | Discharge status: Against Medical Advice | Payer: Medicare Other | Attending: Emergency Medicine | Admitting: Emergency Medicine

## 2022-06-30 DIAGNOSIS — Z5321 Procedure and treatment not carried out due to patient leaving prior to being seen by health care provider: Secondary | ICD-10-CM | POA: Diagnosis not present

## 2022-06-30 DIAGNOSIS — M25521 Pain in right elbow: Secondary | ICD-10-CM | POA: Diagnosis not present

## 2022-06-30 DIAGNOSIS — S0083XA Contusion of other part of head, initial encounter: Secondary | ICD-10-CM | POA: Insufficient documentation

## 2022-06-30 DIAGNOSIS — S0990XA Unspecified injury of head, initial encounter: Secondary | ICD-10-CM | POA: Diagnosis present
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
## 2022-07-01 NOTE — ED Notes (Signed)
Patient refusing vitals at this time.

## 2022-07-01 NOTE — ED Notes (Signed)
Called pt x3 for vitals, no response. 

## 2022-07-01 NOTE — ED Notes (Signed)
Called x 3 no answer 

## 2022-07-19 ENCOUNTER — Encounter (HOSPITAL_COMMUNITY): Payer: Self-pay

## 2022-07-19 ENCOUNTER — Other Ambulatory Visit: Payer: Self-pay

## 2022-07-19 ENCOUNTER — Emergency Department (HOSPITAL_COMMUNITY): Payer: Medicare Other

## 2022-07-19 ENCOUNTER — Emergency Department (HOSPITAL_COMMUNITY)
Admission: EM | Admit: 2022-07-19 | Discharge: 2022-07-19 | Payer: Medicare Other | Attending: Emergency Medicine | Admitting: Emergency Medicine

## 2022-07-19 DIAGNOSIS — S0101XA Laceration without foreign body of scalp, initial encounter: Secondary | ICD-10-CM | POA: Insufficient documentation

## 2022-07-19 DIAGNOSIS — W19XXXA Unspecified fall, initial encounter: Secondary | ICD-10-CM | POA: Diagnosis not present

## 2022-07-19 DIAGNOSIS — I10 Essential (primary) hypertension: Secondary | ICD-10-CM | POA: Diagnosis not present

## 2022-07-19 DIAGNOSIS — Z79899 Other long term (current) drug therapy: Secondary | ICD-10-CM | POA: Insufficient documentation

## 2022-07-19 DIAGNOSIS — S0990XA Unspecified injury of head, initial encounter: Secondary | ICD-10-CM | POA: Diagnosis present

## 2022-07-19 DIAGNOSIS — M542 Cervicalgia: Secondary | ICD-10-CM | POA: Diagnosis not present

## 2022-07-19 DIAGNOSIS — M545 Low back pain, unspecified: Secondary | ICD-10-CM | POA: Diagnosis not present

## 2022-07-19 DIAGNOSIS — Z23 Encounter for immunization: Secondary | ICD-10-CM | POA: Diagnosis not present

## 2022-07-19 MED ORDER — LIDOCAINE-EPINEPHRINE (PF) 2 %-1:200000 IJ SOLN
10.0000 mL | Freq: Once | INTRAMUSCULAR | Status: AC
  Administered 2022-07-19: 2 mL
  Filled 2022-07-19: qty 20

## 2022-07-19 MED ORDER — TETANUS-DIPHTH-ACELL PERTUSSIS 5-2.5-18.5 LF-MCG/0.5 IM SUSY
0.5000 mL | PREFILLED_SYRINGE | Freq: Once | INTRAMUSCULAR | Status: AC
Start: 2022-07-19 — End: 2022-07-19
  Administered 2022-07-19: 0.5 mL via INTRAMUSCULAR
  Filled 2022-07-19: qty 0.5

## 2022-07-19 NOTE — ED Notes (Signed)
I provided reinforced discharge education based off of discharge summary/care provided. Pt acknowledged and understood my education. Pt had no further questions/concerns for provider/myself.   
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
## 2022-07-19 NOTE — Discharge Instructions (Addendum)
The CT scan of your head and neck did not show any acute fractures or brain bleeds.  Your staples will need to come out in 7 days.  Get help right away if: You have: A severe headache that is not helped by medicine. Trouble walking or weakness in your arms and legs. Clear or bloody fluid coming from your nose or ears. Changes in your vision. A seizure. Increased confusion or irritability. Your symptoms get worse. You are sleepier than normal and have trouble staying awake. You lose your balance. Your pupils change size. Your speech is slurred. Your dizziness gets worse. You vomit. You develop severe swelling around the wound. Your pain suddenly increases and is severe. You develop painful lumps near the wound or on skin anywhere else on your body. You have a red streak going away from your wound.

## 2022-07-19 NOTE — ED Notes (Signed)
Pt returned from CT °

## 2022-07-19 NOTE — ED Triage Notes (Signed)
[facility] after unwitnessed fall.   Pt has non-visualized wound on forehead. Bleeding controlled.   Alert, oriented and ambulatory.

## 2022-09-12 ENCOUNTER — Emergency Department (HOSPITAL_COMMUNITY)
Admission: EM | Admit: 2022-09-12 | Discharge: 2022-09-13 | Disposition: A | Discharge status: Other Acute Inpatient Hospital | Payer: Medicare Other | Attending: Emergency Medicine | Admitting: Emergency Medicine

## 2022-09-12 ENCOUNTER — Other Ambulatory Visit: Payer: Self-pay

## 2022-09-12 DIAGNOSIS — F1420 Cocaine dependence, uncomplicated: Secondary | ICD-10-CM | POA: Insufficient documentation

## 2022-09-12 DIAGNOSIS — I10 Essential (primary) hypertension: Secondary | ICD-10-CM | POA: Diagnosis not present

## 2022-09-12 DIAGNOSIS — F259 Schizoaffective disorder, unspecified: Secondary | ICD-10-CM | POA: Diagnosis not present

## 2022-09-12 DIAGNOSIS — Y900 Blood alcohol level of less than 20 mg/100 ml: Secondary | ICD-10-CM | POA: Diagnosis not present

## 2022-09-12 DIAGNOSIS — Z20822 Contact with and (suspected) exposure to covid-19: Secondary | ICD-10-CM | POA: Diagnosis not present

## 2022-09-12 DIAGNOSIS — F332 Major depressive disorder, recurrent severe without psychotic features: Secondary | ICD-10-CM | POA: Diagnosis not present

## 2022-09-12 DIAGNOSIS — Z046 Encounter for general psychiatric examination, requested by authority: Secondary | ICD-10-CM | POA: Diagnosis present

## 2022-09-12 DIAGNOSIS — Z79899 Other long term (current) drug therapy: Secondary | ICD-10-CM | POA: Insufficient documentation

## 2022-09-12 DIAGNOSIS — F1220 Cannabis dependence, uncomplicated: Secondary | ICD-10-CM | POA: Insufficient documentation

## 2022-09-12 DIAGNOSIS — R45851 Suicidal ideations: Secondary | ICD-10-CM | POA: Insufficient documentation

## 2022-09-12 LAB — CBC
HCT: 41.8 % (ref 39.0–52.0)
Hemoglobin: 14.7 g/dL (ref 13.0–17.0)
MCH: 32.7 pg (ref 26.0–34.0)
MCHC: 35.2 g/dL (ref 30.0–36.0)
MCV: 93.1 fL (ref 80.0–100.0)
Platelets: 398 10*3/uL (ref 150–400)
RBC: 4.49 MIL/uL (ref 4.22–5.81)
RDW: 12.8 % (ref 11.5–15.5)
WBC: 16.5 10*3/uL — ABNORMAL HIGH (ref 4.0–10.5)
nRBC: 0 % (ref 0.0–0.2)

## 2022-09-12 LAB — COMPREHENSIVE METABOLIC PANEL
ALT: 17 U/L (ref 0–44)
AST: 25 U/L (ref 15–41)
Albumin: 3.8 g/dL (ref 3.5–5.0)
Alkaline Phosphatase: 77 U/L (ref 38–126)
Anion gap: 14 (ref 5–15)
BUN: 25 mg/dL — ABNORMAL HIGH (ref 8–23)
CO2: 20 mmol/L — ABNORMAL LOW (ref 22–32)
Calcium: 9.2 mg/dL (ref 8.9–10.3)
Chloride: 106 mmol/L (ref 98–111)
Creatinine, Ser: 1.12 mg/dL (ref 0.61–1.24)
GFR, Estimated: >60 mL/min (ref >60)
Glucose, Bld: 84 mg/dL (ref 70–99)
Potassium: 3.4 mmol/L — ABNORMAL LOW (ref 3.5–5.1)
Sodium: 140 mmol/L (ref 135–145)
Total Bilirubin: 1.4 mg/dL — ABNORMAL HIGH (ref 0.3–1.2)
Total Protein: 6.8 g/dL (ref 6.5–8.1)

## 2022-09-12 LAB — RAPID URINE DRUG SCREEN, HOSP PERFORMED
Amphetamines: NOT DETECTED
Barbiturates: NOT DETECTED
Benzodiazepines: NOT DETECTED
Cocaine: POSITIVE — AB
Opiates: NOT DETECTED
Tetrahydrocannabinol: POSITIVE — AB

## 2022-09-12 LAB — ACETAMINOPHEN LEVEL: Acetaminophen (Tylenol), Serum: <10 ug/mL — ABNORMAL LOW (ref 10–30)

## 2022-09-12 LAB — RESP PANEL BY RT-PCR (FLU A&B, COVID) ARPGX2
Influenza A by PCR: NEGATIVE
Influenza B by PCR: NEGATIVE
SARS Coronavirus 2 by RT PCR: NEGATIVE

## 2022-09-12 LAB — SALICYLATE LEVEL: Salicylate Lvl: <7 mg/dL — ABNORMAL LOW (ref 7.0–30.0)

## 2022-09-12 LAB — ETHANOL: Alcohol, Ethyl (B): <10 mg/dL (ref <10)

## 2022-09-12 MED ORDER — HYDROXYZINE HCL 10 MG PO TABS
10.0000 mg | ORAL_TABLET | Freq: Once | ORAL | Status: DC
  Filled 2022-09-12: qty 1

## 2022-09-12 NOTE — BH Assessment (Signed)
TTS spoke to [place] CN, to put pt in a private room to complete TTS assessment. Clinician to call the cart.

## 2022-09-12 NOTE — ED Provider Triage Note (Signed)
Emergency Medicine Provider Triage Evaluation Note  Stephen Delacruz , a 66 y.o. male  was evaluated in triage.  Pt complains of suicidal ideation.  Patient states that he has been depressed for about 1 week and has thoughts about hurting himself.  He states that he would jump off a bridge.  States that recently became homeless about 1 week ago which is likely a stressor for him.  Denies homicidal ideation, AVH.  He takes Zyprexa and states that he has been compliant on this..  Review of Systems  Positive: See above Negative:   Physical Exam  BP 127/83 (BP Location: Right Arm)   Pulse (!) 112   Temp 98.3 F (36.8 C) (Oral)   Resp 16   Ht 5\' 6"  (1.676 m)   Wt 80.3 kg   SpO2 96%   BMI 28.57 kg/m  Gen:   Awake, no distress   Resp:  Normal effort  MSK:   Moves extremities without difficulty  Other:  Somewhat anxious on exam, fidgety  Medical Decision Making  Medically screening exam initiated at 9:13 AM.  Appropriate orders placed.  Jospeh Mangel was informed that the remainder of the evaluation will be completed by another provider, this initial triage assessment does not replace that evaluation, and the importance of remaining in the ED until their evaluation is complete.     Mickie Hillier, PA-C 09/12/22 [phone]

## 2022-09-12 NOTE — ED Notes (Signed)
Sitter ordered and security called for wanding

## 2022-09-12 NOTE — ED Triage Notes (Signed)
Pt. Stated. I just want to harm myself cause Stephen Delacruz been depressed for the last week. Its been a couple of years since I was here for the same.

## 2022-09-12 NOTE — ED Notes (Signed)
TTS machine at bedside. 

## 2022-09-12 NOTE — ED Triage Notes (Signed)
Pt. Stated, my feet hurt also
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
## 2022-09-12 NOTE — ED Provider Notes (Signed)
[facility] EMERGENCY DEPARTMENT Provider Note   CSN: [id number] Arrival date & time: 09/12/22  0850     History  Chief Complaint  Patient presents with   Suicidal   Depression    Stephen Delacruz is a 66 y.o. male.  30 yo M with a cc of SI.  Going on for about a week.  Plans to jump off a building.  No building in mind.  Denies medical complaint, chest pain, sob, cough, fever.     Depression       Home Medications Prior to Admission medications   Medication Sig Start Date End Date Taking? Authorizing Provider  amLODipine (NORVASC) 10 MG tablet Take 1 tablet (10 mg total) by mouth daily. Patient not taking: Reported on 09/12/2022 02/23/21   Antonieta Pert, MD  amLODipine (NORVASC) 10 MG tablet Take 1 tablet (10 mg total) by mouth daily. Patient not taking: Reported on 09/12/2022 06/15/21   Vanetta Mulders, MD  gabapentin (NEURONTIN) 100 MG capsule Take 1 capsule (100 mg total) by mouth 3 (three) times daily. Patient not taking: Reported on 09/12/2022 02/23/21   Antonieta Pert, MD  OLANZapine (ZYPREXA) 15 MG tablet Take 1 tablet (15 mg total) by mouth at bedtime. Patient not taking: Reported on 09/12/2022 06/15/21   Vanetta Mulders, MD  OLANZapine (ZYPREXA) 5 MG tablet Take 1 tablet (5 mg total) by mouth in the morning. Patient not taking: Reported on 09/12/2022 06/15/21   Vanetta Mulders, MD  olanzapine zydis (ZYPREXA) 15 MG disintegrating tablet Take 30 mg by mouth at bedtime. Patient not taking: Reported on 09/12/2022 03/19/21   [provider]  OXcarbazepine (TRILEPTAL) 150 MG tablet Take 1 tablet (150 mg total) by mouth 2 (two) times daily. Patient not taking: Reported on 09/12/2022 02/23/21   Antonieta Pert, MD  OXcarbazepine (TRILEPTAL) 150 MG tablet Take 1 tablet (150 mg total) by mouth 2 (two) times daily. Patient not taking: Reported on 09/12/2022 06/15/21   Vanetta Mulders, MD  thiamine 100 MG tablet Take 1 tablet (100 mg total) by mouth  daily. Patient not taking: Reported on 09/12/2022 05/03/15   Thermon Leyland, NP      Allergies    Prolixin [fluphenazine]    Review of Systems   Review of Systems  Psychiatric/Behavioral:  Positive for depression.     Physical Exam Updated Vital Signs BP 127/83 (BP Location: Right Arm)   Pulse (!) 112   Temp 98.3 F (36.8 C) (Oral)   Resp 16   Ht 5\' 6"  (1.676 m)   Wt 80.3 kg   SpO2 96%   BMI 28.57 kg/m  Physical Exam Vitals and nursing note reviewed.  Constitutional:      Appearance: He is well-developed.  HENT:     Head: Normocephalic and atraumatic.  Eyes:     Pupils: Pupils are equal, round, and reactive to light.  Neck:     Vascular: No JVD.  Cardiovascular:     Rate and Rhythm: Normal rate and regular rhythm.     Heart sounds: No murmur heard.    No friction rub. No gallop.  Pulmonary:     Effort: No respiratory distress.     Breath sounds: No wheezing.  Abdominal:     General: There is no distension.     Tenderness: There is no abdominal tenderness. There is no guarding or rebound.  Musculoskeletal:        General: Normal range of motion.     Cervical  back: Normal range of motion and neck supple.  Skin:    Coloration: Skin is not pale.     Findings: No rash.  Neurological:     Mental Status: He is alert and oriented to person, place, and time.  Psychiatric:        Mood and Affect: Mood is depressed. Affect is blunt.        Behavior: Behavior normal.        Thought Content: Thought content includes suicidal ideation. Thought content does not include homicidal ideation. Thought content includes suicidal plan. Thought content does not include homicidal plan.     ED Results / Procedures / Treatments   Labs (all labs ordered are listed, but only abnormal results are displayed) Labs Reviewed  COMPREHENSIVE METABOLIC PANEL - Abnormal; Notable for the following components:      Result Value   Potassium 3.4 (*)    CO2 20 (*)    BUN 25 (*)    Total  Bilirubin 1.4 (*)    All other components within normal limits  SALICYLATE LEVEL - Abnormal; Notable for the following components:   Salicylate Lvl <0.3 (*)    All other components within normal limits  ACETAMINOPHEN LEVEL - Abnormal; Notable for the following components:   Acetaminophen (Tylenol), Serum <10 (*)    All other components within normal limits  CBC - Abnormal; Notable for the following components:   WBC 16.5 (*)    All other components within normal limits  RAPID URINE DRUG SCREEN, HOSP PERFORMED - Abnormal; Notable for the following components:   Cocaine POSITIVE (*)    Tetrahydrocannabinol POSITIVE (*)    All other components within normal limits  RESP PANEL BY RT-PCR (FLU A&B, COVID) [id number]  ETHANOL    EKG None  Radiology No results found.  Procedures Procedures    Medications Ordered in ED Medications  hydrOXYzine (ATARAX) tablet 10 mg (has no administration in time range)    ED Course/ Medical Decision Making/ A&P                           Medical Decision Making Amount and/or Complexity of Data Reviewed Labs: ordered.   66 yo M with a cc of suicidal ideation.  He has a plan to jump off of a building.  Has not picked a specific building in mind.  He has no medical complaints.  He had lab work obtained in the [facility] process that is largely unremarkable.  I feel he is medically clear for TTS evaluation. The patients results and plan were reviewed and discussed.   Any x-rays performed were independently reviewed by myself.   Differential diagnosis were considered with the presenting HPI.  Medications  hydrOXYzine (ATARAX) tablet 10 mg (has no administration in time range)    Vitals:   09/12/22 0854 09/12/22 0905  BP: 127/83   Pulse: (!) 112   Resp: 16   Temp: 98.3 F (36.8 C)   TempSrc: Oral   SpO2: 96%   Weight:  80.3 kg  Height:  5\' 6"  (1.676 m)    Final diagnoses:  Suicidal ideation            Final Clinical Impression(s) /  ED Diagnoses Final diagnoses:  Suicidal ideation    Rx / DC Orders ED Discharge Orders     None         Deno Etienne, DO 09/12/22 1550

## 2022-09-12 NOTE — ED Notes (Signed)
Pt belongings placed in locker number 8

## 2022-09-13 ENCOUNTER — Other Ambulatory Visit (HOSPITAL_COMMUNITY)
Admission: EM | Admit: 2022-09-13 | Discharge: 2022-09-19 | Disposition: A | Discharge status: Home / Self Care | Payer: Medicare Other | Attending: Psychiatry | Admitting: Psychiatry

## 2022-09-13 DIAGNOSIS — W19XXXA Unspecified fall, initial encounter: Secondary | ICD-10-CM | POA: Insufficient documentation

## 2022-09-13 DIAGNOSIS — F1020 Alcohol dependence, uncomplicated: Secondary | ICD-10-CM

## 2022-09-13 DIAGNOSIS — F331 Major depressive disorder, recurrent, moderate: Secondary | ICD-10-CM | POA: Diagnosis not present

## 2022-09-13 DIAGNOSIS — F1910 Other psychoactive substance abuse, uncomplicated: Secondary | ICD-10-CM | POA: Diagnosis not present

## 2022-09-13 DIAGNOSIS — F1220 Cannabis dependence, uncomplicated: Secondary | ICD-10-CM | POA: Diagnosis present

## 2022-09-13 DIAGNOSIS — F250 Schizoaffective disorder, bipolar type: Secondary | ICD-10-CM | POA: Insufficient documentation

## 2022-09-13 DIAGNOSIS — Z20822 Contact with and (suspected) exposure to covid-19: Secondary | ICD-10-CM | POA: Insufficient documentation

## 2022-09-13 DIAGNOSIS — R4689 Other symptoms and signs involving appearance and behavior: Secondary | ICD-10-CM

## 2022-09-13 DIAGNOSIS — R55 Syncope and collapse: Secondary | ICD-10-CM | POA: Insufficient documentation

## 2022-09-13 DIAGNOSIS — F319 Bipolar disorder, unspecified: Secondary | ICD-10-CM | POA: Diagnosis present

## 2022-09-13 DIAGNOSIS — I10 Essential (primary) hypertension: Secondary | ICD-10-CM | POA: Diagnosis present

## 2022-09-13 DIAGNOSIS — F1420 Cocaine dependence, uncomplicated: Secondary | ICD-10-CM

## 2022-09-13 DIAGNOSIS — R45851 Suicidal ideations: Secondary | ICD-10-CM | POA: Insufficient documentation

## 2022-09-13 DIAGNOSIS — Z91148 Patient's other noncompliance with medication regimen for other reason: Secondary | ICD-10-CM | POA: Insufficient documentation

## 2022-09-13 DIAGNOSIS — F339 Major depressive disorder, recurrent, unspecified: Secondary | ICD-10-CM | POA: Diagnosis present

## 2022-09-13 MED ORDER — OLANZAPINE 5 MG PO TABS: 5.0000 mg | ORAL_TABLET | Freq: Every day | ORAL | Status: DC

## 2022-09-13 MED ORDER — ALUM & MAG HYDROXIDE-SIMETH 200-200-20 MG/5ML PO SUSP
30.0000 mL | ORAL | Status: DC | PRN
  Administered 2022-09-18: 30 mL via ORAL
  Filled 2022-09-13: qty 30

## 2022-09-13 MED ORDER — OXCARBAZEPINE 150 MG PO TABS: 150.0000 mg | ORAL_TABLET | Freq: Two times a day (BID) | ORAL | Status: DC

## 2022-09-13 MED ORDER — ACETAMINOPHEN 325 MG PO TABS
650.0000 mg | ORAL_TABLET | Freq: Four times a day (QID) | ORAL | Status: DC | PRN
  Filled 2022-09-13: qty 2

## 2022-09-13 MED ORDER — MAGNESIUM HYDROXIDE 400 MG/5ML PO SUSP: 30.0000 mL | Freq: Every day | ORAL | Status: DC | PRN

## 2022-09-13 MED ORDER — OXCARBAZEPINE 150 MG PO TABS
150.0000 mg | ORAL_TABLET | Freq: Two times a day (BID) | ORAL | Status: DC
  Administered 2022-09-13 – 2022-09-19 (×12): 150 mg via ORAL
  Filled 2022-09-13 (×3): qty 1
  Filled 2022-09-13: qty 14
  Filled 2022-09-13 (×7): qty 1
  Filled 2022-09-13: qty 14
  Filled 2022-09-13 (×2): qty 1

## 2022-09-13 MED ORDER — OLANZAPINE 5 MG PO TABS
5.0000 mg | ORAL_TABLET | Freq: Every morning | ORAL | Status: DC
  Administered 2022-09-13: 5 mg via ORAL
  Filled 2022-09-13: qty 1

## 2022-09-13 MED ORDER — HYDROXYZINE HCL 10 MG PO TABS
10.0000 mg | ORAL_TABLET | Freq: Four times a day (QID) | ORAL | Status: DC | PRN
  Administered 2022-09-14: 10 mg via ORAL
  Filled 2022-09-13: qty 1

## 2022-09-13 MED ORDER — OXCARBAZEPINE 150 MG PO TABS
150.0000 mg | ORAL_TABLET | Freq: Two times a day (BID) | ORAL | Status: DC
  Administered 2022-09-13: 150 mg via ORAL
  Filled 2022-09-13 (×2): qty 1

## 2022-09-13 MED ORDER — OLANZAPINE 5 MG PO TABS: 5.0000 mg | ORAL_TABLET | Freq: Every evening | ORAL | Status: DC | PRN

## 2022-09-13 MED ORDER — TRIPLE ANTIBIOTIC 3.5-400-5000 EX OINT
1.0000 | TOPICAL_OINTMENT | Freq: Two times a day (BID) | CUTANEOUS | Status: DC
  Administered 2022-09-13 – 2022-09-19 (×12): 1 via TOPICAL
  Filled 2022-09-13 (×11): qty 1

## 2022-09-13 MED ORDER — AMLODIPINE BESYLATE 10 MG PO TABS
10.0000 mg | ORAL_TABLET | Freq: Once | ORAL | Status: AC
  Administered 2022-09-13: 10 mg via ORAL
  Filled 2022-09-13: qty 1

## 2022-09-13 MED ORDER — OLANZAPINE 5 MG PO TABS: 15.0000 mg | ORAL_TABLET | Freq: Every day | ORAL | Status: DC

## 2022-09-13 MED ORDER — THIAMINE HCL 100 MG PO TABS
100.0000 mg | ORAL_TABLET | Freq: Every day | ORAL | Status: DC
  Administered 2022-09-13: 100 mg via ORAL
  Filled 2022-09-13: qty 1

## 2022-09-13 NOTE — ED Notes (Signed)
Pt resting in bed. A&O x4, calm and cooperative. Denies current SI/HI/AVH. No signs of distress noted. Monitoring for safety. 

## 2022-09-13 NOTE — ED Notes (Signed)
Berton Mount ,Pt's Brother returned TC. Pt does not have a Guardian .

## 2022-09-13 NOTE — ED Notes (Signed)
Pt is attending AA. 

## 2022-09-13 NOTE — ED Notes (Signed)
PT DOES NOT HAVE A GUARDIAN . This has been verified by Pt's Brother Berton Mount.

## 2022-09-13 NOTE — Progress Notes (Signed)
Per Ricky Ala, NP pt has been accepted to [facility]. Accepting provider is Melba Coon, MD. Care Team notified: Elnora Morrison, MD, [name], LPN, Ricky Ala, NP, Jerral Bonito, RN, Merrily Brittle, DO, Melba Coon, MD, Adrian Prince, RN   Benjaman Kindler, MSW, [facility] 09/13/2022 12:57 PM

## 2022-09-13 NOTE — ED Notes (Signed)
Pt 's Brother ,Claud Gowan, is the guardian . # [phone]

## 2022-09-13 NOTE — ED Notes (Signed)
Pt talked with Brother on the phone. This Probation officer informed Brother Pt was accepted to [facility] for treatment.

## 2022-09-13 NOTE — ED Notes (Signed)
Pt ambulated from room 4 to room 52 . Pt A/O . Pt observed to have tremors in arms .

## 2022-09-13 NOTE — ED Notes (Signed)
Patient admitted to [facility] from [facility] due to depression, cocaine, THC and homelessness.  Patient was calm and cooperative with minimal eye contact and flat affect.  Patient shown around unit and to his room.  He is presently lying down and resting.  Patient denies avh shi or plan at this time.  Encouraged him to make needs known by seeking out staff.  Will monitor and provide a safe environment.

## 2022-09-13 NOTE — ED Notes (Signed)
Attempted call to [facility] but  staff unable to take report.

## 2022-09-13 NOTE — ED Provider Notes (Signed)
Emergency Medicine Observation Re-evaluation Note  Stephen Delacruz is a 66 y.o. male, seen on rounds today.  Pt initially presented to the ED for complaints of Suicidal and Depression Currently, the patient is awaiting placement.  Physical Exam  BP (!) 161/89   Pulse 78   Temp 98.4 F (36.9 C) (Oral)   Resp 16   Ht 5\' 6"  (1.676 m)   Wt 80.3 kg   SpO2 98%   BMI 28.57 kg/m  Physical Exam General: Conversant, no concerns Cardiac: Normal heart rate Lungs: Normal work of breathing Psych: Not currently agitated or aggressive, cooperative  ED Course / MDM  EKG:EKG Interpretation  Date/Time:  Tuesday September 12 2022 16:06:48 EDT Ventricular Rate:  99 PR Interval:  156 QRS Duration: 86 QT Interval:  350 QTC Calculation: 449 R Axis:   74 Text Interpretation: Normal sinus rhythm Normal ECG When compared with ECG of 12-Jun-2021 20:05, No significant change was found Confirmed by Delora Fuel ([id number]) on 09/12/2022 11:12:46 PM  I have reviewed the labs performed to date as well as medications administered while in observation.  Recent changes in the last 24 hours include no changes this morning..  Plan  Current plan has been accepted Dr. Barrington Ellison for transfer today.  Patient comfortable this plan.    Elnora Morrison, MD 09/13/22 1153
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
## 2022-09-13 NOTE — ED Notes (Signed)
TC placed to PT Guardian to inform of transfer to [place] Unit. Message left for Brother to call back .

## 2022-09-13 NOTE — ED Notes (Signed)
Pt asleep in bed. Respirations even and unlabored. Monitoring for safety. 

## 2022-09-14 ENCOUNTER — Encounter (HOSPITAL_COMMUNITY): Payer: Self-pay

## 2022-09-14 DIAGNOSIS — F339 Major depressive disorder, recurrent, unspecified: Secondary | ICD-10-CM | POA: Diagnosis not present

## 2022-09-14 DIAGNOSIS — F331 Major depressive disorder, recurrent, moderate: Secondary | ICD-10-CM

## 2022-09-14 DIAGNOSIS — R55 Syncope and collapse: Secondary | ICD-10-CM | POA: Diagnosis not present

## 2022-09-14 DIAGNOSIS — W19XXXA Unspecified fall, initial encounter: Secondary | ICD-10-CM | POA: Diagnosis not present

## 2022-09-14 DIAGNOSIS — I10 Essential (primary) hypertension: Secondary | ICD-10-CM | POA: Diagnosis not present

## 2022-09-14 DIAGNOSIS — F250 Schizoaffective disorder, bipolar type: Secondary | ICD-10-CM | POA: Diagnosis not present

## 2022-09-14 DIAGNOSIS — R45851 Suicidal ideations: Secondary | ICD-10-CM | POA: Diagnosis not present

## 2022-09-14 DIAGNOSIS — Z20822 Contact with and (suspected) exposure to covid-19: Secondary | ICD-10-CM | POA: Diagnosis not present

## 2022-09-14 DIAGNOSIS — Z91148 Patient's other noncompliance with medication regimen for other reason: Secondary | ICD-10-CM | POA: Diagnosis not present

## 2022-09-14 MED ORDER — AMLODIPINE BESYLATE 10 MG PO TABS
10.0000 mg | ORAL_TABLET | Freq: Every day | ORAL | Status: DC
  Administered 2022-09-14 – 2022-09-18 (×5): 10 mg via ORAL
  Filled 2022-09-14 (×2): qty 1
  Filled 2022-09-14: qty 7
  Filled 2022-09-14 (×3): qty 1

## 2022-09-14 MED ORDER — ATORVASTATIN CALCIUM 40 MG PO TABS
40.0000 mg | ORAL_TABLET | Freq: Every day | ORAL | Status: DC
  Administered 2022-09-14 – 2022-09-18 (×5): 40 mg via ORAL
  Filled 2022-09-14: qty 1
  Filled 2022-09-14: qty 7
  Filled 2022-09-14 (×4): qty 1

## 2022-09-14 MED ORDER — THIAMINE MONONITRATE 100 MG PO TABS
100.0000 mg | ORAL_TABLET | Freq: Every day | ORAL | Status: DC
  Administered 2022-09-14 – 2022-09-19 (×6): 100 mg via ORAL
  Filled 2022-09-14 (×2): qty 1
  Filled 2022-09-14: qty 7
  Filled 2022-09-14 (×6): qty 1

## 2022-09-14 NOTE — ED Notes (Signed)
Snacks given 

## 2022-09-14 NOTE — BH IP Treatment Plan (Signed)
Interdisciplinary Treatment and Diagnostic Plan Update  09/14/2022 Time of Session: [place] MRN: [id number]  Diagnosis:  Final diagnoses:  Moderate episode of recurrent major depressive disorder (HCC)  Substance abuse ([place])     Current Medications:  Current Facility-Administered Medications  Medication Dose Route Frequency Provider Last Rate Last Admin   acetaminophen (TYLENOL) tablet 650 mg  650 mg Oral Q6H PRN Derrill Center, NP       alum & mag hydroxide-simeth (MAALOX/MYLANTA) 200-200-20 MG/5ML suspension 30 mL  30 mL Oral Q4H PRN Derrill Center, NP       hydrOXYzine (ATARAX) tablet 10 mg  10 mg Oral Q6H PRN Derrill Center, NP   10 mg at 09/14/22 0644   magnesium hydroxide (MILK OF MAGNESIA) suspension 30 mL  30 mL Oral Daily PRN Derrill Center, NP       neomycin-bacitracin-polymyxin [phone] OINT 1 Application  1 Application Topical BID Lavella Hammock, MD   1 Application at 25/00/37 1016   OLANZapine (ZYPREXA) tablet 5 mg  5 mg Oral QHS PRN Lavella Hammock, MD       OXcarbazepine (TRILEPTAL) tablet 150 mg  150 mg Oral BID Lavella Hammock, MD   150 mg at 09/14/22 1016   Current Outpatient Medications  Medication Sig Dispense Refill   amLODipine (NORVASC) 10 MG tablet Take 1 tablet (10 mg total) by mouth daily. (Patient not taking: Reported on 09/12/2022) 30 tablet 0   amLODipine (NORVASC) 10 MG tablet Take 1 tablet (10 mg total) by mouth daily. (Patient not taking: Reported on 09/12/2022) 30 tablet 0   gabapentin (NEURONTIN) 100 MG capsule Take 1 capsule (100 mg total) by mouth 3 (three) times daily. (Patient not taking: Reported on 09/12/2022) 90 capsule 0   OLANZapine (ZYPREXA) 15 MG tablet Take 1 tablet (15 mg total) by mouth at bedtime. (Patient not taking: Reported on 09/12/2022) 30 tablet 0   OLANZapine (ZYPREXA) 5 MG tablet Take 1 tablet (5 mg total) by mouth in the morning. (Patient not taking: Reported on 09/12/2022) 30 tablet 0   olanzapine zydis  (ZYPREXA) 15 MG disintegrating tablet Take 30 mg by mouth at bedtime. (Patient not taking: Reported on 09/12/2022)     OXcarbazepine (TRILEPTAL) 150 MG tablet Take 1 tablet (150 mg total) by mouth 2 (two) times daily. (Patient not taking: Reported on 09/12/2022) 60 tablet 0   OXcarbazepine (TRILEPTAL) 150 MG tablet Take 1 tablet (150 mg total) by mouth 2 (two) times daily. (Patient not taking: Reported on 09/12/2022) 60 tablet 0   thiamine 100 MG tablet Take 1 tablet (100 mg total) by mouth daily. (Patient not taking: Reported on 09/12/2022)     PTA Medications: Prior to Admission medications   Medication Sig Start Date End Date Taking? Authorizing Provider  amLODipine (NORVASC) 10 MG tablet Take 1 tablet (10 mg total) by mouth daily. Patient not taking: Reported on 09/12/2022 02/23/21   Sharma Covert, MD  amLODipine (NORVASC) 10 MG tablet Take 1 tablet (10 mg total) by mouth daily. Patient not taking: Reported on 09/12/2022 06/15/21   Fredia Sorrow, MD  gabapentin (NEURONTIN) 100 MG capsule Take 1 capsule (100 mg total) by mouth 3 (three) times daily. Patient not taking: Reported on 09/12/2022 02/23/21   Sharma Covert, MD  OLANZapine (ZYPREXA) 15 MG tablet Take 1 tablet (15 mg total) by mouth at bedtime. Patient not taking: Reported on 09/12/2022 06/15/21   Fredia Sorrow, MD  OLANZapine (ZYPREXA) 5 MG tablet  Take 1 tablet (5 mg total) by mouth in the morning. Patient not taking: Reported on 09/12/2022 06/15/21   Fredia Sorrow, MD  olanzapine zydis (ZYPREXA) 15 MG disintegrating tablet Take 30 mg by mouth at bedtime. Patient not taking: Reported on 09/12/2022 03/19/21   [provider]  OXcarbazepine (TRILEPTAL) 150 MG tablet Take 1 tablet (150 mg total) by mouth 2 (two) times daily. Patient not taking: Reported on 09/12/2022 02/23/21   Sharma Covert, MD  OXcarbazepine (TRILEPTAL) 150 MG tablet Take 1 tablet (150 mg total) by mouth 2 (two) times daily. Patient not taking: Reported  on 09/12/2022 06/15/21   Fredia Sorrow, MD  thiamine 100 MG tablet Take 1 tablet (100 mg total) by mouth daily. Patient not taking: Reported on 09/12/2022 05/03/15   Niel Hummer, NP    Patient Stressors: Financial difficulties   Loss of housing over a year ago.   Substance abuse   Other: Severe depression and passive SI.    Patient Strengths: Psychologist, clinical for treatment/growth  Supportive family/friends   Treatment Modalities: Medication Management, Group therapy, Case management,  1 to 1 session with clinician, Psychoeducation, Recreational therapy.   Physician Treatment Plan for Primary and Secondary Diagnosis:  Final diagnoses:  Moderate episode of recurrent major depressive disorder (HCC)  Substance abuse ([place])   Long Term Goal(s): Improvement in symptoms so as ready for discharge  Short Term Goals: Patient will verbalize feelings in meetings with treatment team members. Patient will attend at least of 50% of the groups daily. Pt will complete the PHQ9 on admission, day 3 and discharge.  Medication Management: Evaluate patient's response, side effects, and tolerance of medication regimen.  Therapeutic Interventions: 1 to 1 sessions, Unit Group sessions and Medication administration.  Evaluation of Outcomes: Not Met  LCSW Treatment Plan for Primary Diagnosis:  Final diagnoses:  Moderate episode of recurrent major depressive disorder (HCC)  Substance abuse ([place])    Long Term Goal(s): Safe transition to appropriate next level of care at discharge.  Short Term Goals: Facilitate acceptance of mental health diagnosis and concerns through verbal commitment to aftercare plan and appointments at discharge., Patient will identify one social support prior to discharge to aid in patient's recovery., Patient will attend AA/NA groups as scheduled., Identify minimum of 2 triggers associated with mental health/substance abuse issues with treatment team  members., and Increase skills for wellness and recovery by attending 50% of scheduled groups.  Therapeutic Interventions: Assess for all discharge needs, 1 to 1 time with Education officer, museum, Explore available resources and support systems, Assess for adequacy in community support network, Educate family and significant other(s) on suicide prevention, Complete Psychosocial Assessment, Interpersonal group therapy.  Evaluation of Outcomes: Not Met   Progress in Treatment: Attending groups: No. Participating in groups: No. Taking medication as prescribed: Yes. Toleration medication: Yes. Family/Significant other contact made: TTS contacted his brother, Dario Yono, [phone]. Patient understands diagnosis: Yes. Discussing patient identified problems/goals with staff: Yes. Medical problems stabilized or resolved: No. Denies suicidal/homicidal ideation: Passive SI and zero HI. Issues/concerns per patient self-inventory: Yes. Other: Homeless; season depression.  New problem(s) identified: No, Describe:  None indicated.  New Short Term/Long Term Goal(s): "I just want to feel better.  Been feeling depressed."  Patient Goals:  To get into a rehabilitation center to help him with his substance use disorder so that he can feel better.  Discharge Plan or Barriers:  Homeless, minimal support system; minimal fixed income.  Reason for Continuation  of Hospitalization: Depression Suicidal ideation Withdrawal symptoms  Estimated Length of Stay:  Last 3 [place] Suicide Severity Risk Score: [facility] from 09/13/2022 in [facility] ED from 09/12/2022 in [facility] ED from 07/19/2022 in [place]  C-SSRS RISK CATEGORY High Risk High Risk No Risk       Last PHQ 2/9 Scores:    09/14/2022    9:27 AM  Depression screen PHQ 2/9  Decreased Interest 3  Down, Depressed, Hopeless 3  PHQ - 2  Score 6  Altered sleeping 3  Tired, decreased energy 3  Change in appetite 2  Feeling bad or failure about yourself  3  Trouble concentrating 3  Moving slowly or fidgety/restless 1  Suicidal thoughts 2  PHQ-9 Score 23    Scribe for Treatment Team: Kerri Perches, LCSW 09/14/2022 2:48 PM

## 2022-09-14 NOTE — ED Notes (Signed)
Pt is in the bed sleeping. Respirations are even and unlabored. No acute distress noted. Will continue to monitor for safety. 
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
## 2022-09-14 NOTE — ED Notes (Signed)
In a group meeting 

## 2022-09-14 NOTE — ED Notes (Signed)
Pt asleep in bed. Respirations even and unlabored. Monitoring for safety. 

## 2022-09-14 NOTE — Discharge Instructions (Addendum)
Dear Stephen Delacruz,  Most effective treatment for your mental health disease involves BOTH a psychiatrist AND a therapist Psychiatrist to manage medications Therapist to help identify personal goals, barriers from those goals, and plan to achieve those goals by understanding emotions Please make regular appointments with an outpatient psychiatrist and other doctors once you leave the hospital (if any, otherwise, please see below for resources to make an appointment).  For therapy outside the hospital, please ask for these specific types of therapy: DBT ________________________________________________________  SAFETY CRISIS  Dial 988 for [place]    Text [phone] for Crisis Text Line:     [facility]:  [address] FIRST FLOOR.  [place], [place] [address].  [phone]  Mobile Crisis Response Teams Listed by counties in vicinity of [place]. [phone] [place] [phone] [place] [phone] [facility] [phone] [place] [phone] [place]. [phone] [place].  [place] [phone] ________________________________________________________  To see which pharmacy near you is the CHEAPEST for certain medications, please use GoodRx. It is free website and has a free phone app.    Also consider looking at [facility] $4.00 or [facility]'s $7.00 prescription list. Both are free to view if googled "[facility] $4 prescription" and "public's $7 prescription". These are set prices, no insurance required. [facility]'s low cost medications: $4-$15 for 30days  prescriptions or $10-$38 for 90days prescriptions  ________________________________________________________  Difficulties with sleep?   Can also use this free app for insomnia called CBT-I. Let your doctors and therapists know so they can help with extra tips and tricks or for guidance and accountability. NO ADDS on the app.     ________________________________________________________  Non-Emergent / Urgent  [facility] [address] [place],  [address] [phone] OUTPATIENT Walk-in information: Please note, all walk-ins are first come & first serve, with limited number of availability.  Please note that to be eligible for services you must bring: ID or a piece of mail with your name [facility] address  Therapist for therapy:  Monday & Wednesdays: Please ARRIVE at 7:15 AM for registration Will START at 8:00 AM Every 1st & 2nd Friday of the month: Please ARRIVE at 10:15 AM for registration Will START at 1 PM - 5 PM  Psychiatrist for medication management: Monday - Friday:  Please ARRIVE at 7:15 AM for registration Will START at 8:00 AM  Regretfully, due to limited availability, please be aware that you may not been seen on the same day as walk-in. Please consider making an appoint or try again. Thank you for your patience and understanding.    Base on the information you have provided and the presenting issue, outpatient services with therapy and psychiatry have been recommended.  It is imperative that you follow through with treatment recommendations within 5-7 days from the of discharge to mitigate further risk to your safety and mental well-being. A list of referrals has been provided below to get you started.  You are not limited to the list provided.  In case of an urgent crisis, you may contact the Mobile Crisis Unit with [facility] at [phone].  Outpatient Therapy and Psychiatry for Medicare  Recipients  [facility] [address] [place], [place], [address] [phone] phone  [facility] [address] [place], [place], [address] [address]Th [address] phone ([address], [employer] [address] - [place], [address], [place], [place], [employer] Plans, [address], BCBS Healthy [place], [place], [address], [place], [place], [place], Optum, Tricare, UHC, [employer], [place])  [facility] [address], [address] [place], [place], [address] [phone] phone  [facility] [address], [address] [place], [place], [address] [phone] phone  [facility] [address] [place], [place], [address] [phone] phone [phone] fax  [facility] [place] [address], [place], [address] [phone] phone  [facility] [address] [place], [place], [address] [phone] phone [phone] fax  [facility] [address] [place], [place], [address] [phone] phone  Jovita Kussmaul [address] [place], [place], [address] [phone] phone  [facility] [address] [place], [place], [address] [phone] phone [phone] fax   CHARITABLE RESIDENTIAL REHABS  [facility] (men only at this campus) [address] [place], [place] [address] [phone]  ((These programs listed above have a one-time application fee.))   [address] [place] [address] [phone]  [facility] [phone] [address], [place] [address] [phone] [address] [place] [address] [place], [place] [address] [phone], [place] [address] [phone]  [facility] (Rehab for men only) [address], [place], [place] [address] [phone]    MEDICAL  DETOX/RESIDENTIAL TREATMENT- MEDICAID/IPRS:  [facility] [id number] [place], [place] [address] [phone]  [facility] - [facility] (DOES NOT TAKE MEDICARE) [address] [place], [place] [address] [phone]  [facility] Services [address] [place] [place], [place] [address] [phone]  [facility] Recovery Services [address] [place], [place] [address] [phone]  [facility] Services [address] [place], [place] [address] [phone]  ((For admissions to these three [facility] facilities during weekday days and possibly other times, contact [facility], phone: [phone]; fax: [phone]))    OUTPATIENT PROGRAMS:  [facility] ([facility]) [address], [place] [address] [phone]  ((CD-IOP is currently not operational; Opioid replacement clinic is operational))   [employer] [facility] (private insurance) [address], [place] [address] [phone]  ((CD-IOP (afternoon program); individual therapy))   [facility] [employer] (Medicaid for [employer] and [employer]; some private insurance) [address] [place], [place] [address] [phone]  ((CD-IOP))   [facility] & private insurance) [phone] [address] [place], [place] [address] [phone]  ((CD-IOP (morning & evening programs); individual therapy))   HALFWAY HOUSES:  [employer] [phone]  [email or web address]     12 STEP PROGRAMS:  Alcoholics Anonymous of [place] [employer].be  Narcotics Anonymous of [place] [employer]  Al-Anon of [place], [place] www.[place]alanon.org/find-meetings.html  Nar-Anon https://nar-anon.org/find-[email or web address]-meeting

## 2022-09-14 NOTE — ED Notes (Signed)
Patient remains asleep in bed without distress or complaint.  Will monitor. 

## 2022-09-14 NOTE — ED Notes (Addendum)
Attempted to complete EKG 3x. Pt ripped stickers off himself during first attempt due to tremors in bilateral hands/arms and EKG machine would not work properly on other attempts.

## 2022-09-15 ENCOUNTER — Encounter (HOSPITAL_COMMUNITY): Payer: Self-pay | Admitting: Psychiatry

## 2022-09-15 DIAGNOSIS — F339 Major depressive disorder, recurrent, unspecified: Secondary | ICD-10-CM | POA: Diagnosis not present

## 2022-09-15 MED ORDER — SERTRALINE HCL 25 MG PO TABS
25.0000 mg | ORAL_TABLET | Freq: Every day | ORAL | Status: DC
  Administered 2022-09-15 – 2022-09-18 (×4): 25 mg via ORAL
  Filled 2022-09-15 (×4): qty 1

## 2022-09-15 MED ORDER — OLANZAPINE 2.5 MG PO TABS
2.5000 mg | ORAL_TABLET | Freq: Every day | ORAL | Status: DC
  Administered 2022-09-15 – 2022-09-18 (×4): 2.5 mg via ORAL
  Filled 2022-09-15 (×3): qty 1
  Filled 2022-09-15: qty 7
  Filled 2022-09-15: qty 1

## 2022-09-15 NOTE — ED Notes (Signed)
Pt is in the bed sleeping. Respirations are even and unlabored. No acute distress noted. Will continue to monitor for safety. 
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
## 2022-09-15 NOTE — ED Notes (Signed)
Pt asleep in bed. Respirations even and unlabored. Monitoring for safety. 

## 2022-09-15 NOTE — ED Notes (Signed)
Pt resting in bed. A&O x4, calm and cooperative. Denies current SI/HI/AVH. No signs of distress noted. Monitoring for safety. 

## 2022-09-15 NOTE — ED Notes (Signed)
Pt provided breakfast.  He denies pain, SI, HI, and AVH.  Reports fair sleep overnight. Pt speaks softly and minimally with this Probation officer. Currently resting in bed.  Breathing is even and unlabored.  Will continue to monitor for safety.

## 2022-09-15 NOTE — ED Notes (Signed)
Pt laying in room with eyes open. No acute distress noted or voiced. Informed pt to notify staff with any needs or concerns. Will continue to monitor for safety.

## 2022-09-15 NOTE — ED Notes (Addendum)
Pt ate lunch in no acute distress. Quiet but cooperative. Denies concerns at present. Informed pt to notify staff with any needs. Will continue to monitor for safety.

## 2022-09-16 DIAGNOSIS — F339 Major depressive disorder, recurrent, unspecified: Secondary | ICD-10-CM | POA: Diagnosis not present

## 2022-09-16 NOTE — ED Notes (Signed)
Pt asleep in bed. Respirations even and unlabored. Monitoring for safety. 
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
## 2022-09-16 NOTE — ED Notes (Signed)
Patient is sleeping no problems indicated.  

## 2022-09-16 NOTE — ED Notes (Signed)
Patient has been calm and cooperative throughout the day. Patient denies SI/HI and AVH. Patient ate breakfast and lunch and received his medications. Patient is being monitored for safety. Patient continues to rest in his bed and has not spent much time in the common area.

## 2022-09-16 NOTE — ED Notes (Signed)
Pt resting in bed. A&O x4, calm and cooperative. Denies current SI/HI/AVH. No signs of distress noted. Monitoring for safety. 

## 2022-09-17 DIAGNOSIS — F339 Major depressive disorder, recurrent, unspecified: Secondary | ICD-10-CM | POA: Diagnosis not present

## 2022-09-17 MED ORDER — HYDROXYZINE HCL 25 MG PO TABS: 25.0000 mg | ORAL_TABLET | Freq: Four times a day (QID) | ORAL | Status: DC | PRN

## 2022-09-17 NOTE — ED Notes (Signed)
Patient denies SI/HI and AVH. Patient was resting when writer spoke with patient. Patient is being monitored for safety.
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
## 2022-09-17 NOTE — ED Notes (Signed)
Pt is currently sleeping, no distress noted, environmental check complete, will continue to monitor patient for safety. ? ?

## 2022-09-17 NOTE — ED Notes (Signed)
Pt asleep in bed. Respirations even and unlabored. Monitoring for safety. 

## 2022-09-17 NOTE — ED Notes (Signed)
Pt resting in bed. A&O x4, calm and cooperative. Denies current SI/HI/AVH. No signs of distress noted. Monitoring for safety. 

## 2022-09-18 DIAGNOSIS — F339 Major depressive disorder, recurrent, unspecified: Secondary | ICD-10-CM | POA: Diagnosis not present

## 2022-09-18 MED ORDER — SERTRALINE HCL 50 MG PO TABS
50.0000 mg | ORAL_TABLET | Freq: Every day | ORAL | Status: DC
  Administered 2022-09-19: 50 mg via ORAL
  Filled 2022-09-18: qty 7
  Filled 2022-09-18: qty 1

## 2022-09-18 NOTE — ED Notes (Signed)
Pt asleep in bed. Respirations even and unlabored. Monitoring for safety. 
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
## 2022-09-18 NOTE — ED Notes (Signed)
Pt lying in bed with eyes closed. No acute distress noted. RR even and unlabored. Safety maintained.
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
## 2022-09-18 NOTE — ED Notes (Signed)
Pt sleeping in no acute distress. RR even and unlabored. Safety maintained. 

## 2022-09-18 NOTE — ED Notes (Signed)
Patient A&Ox4. Denies intent to harm self/others when asked. Denies A/VH. Patient denies any physical complaints when asked. No acute distress noted. Routine safety checks conducted according to facility protocol. Encouraged patient to notify staff if thoughts of harm toward self or others arise. Patient verbalize understanding and agreement. Will continue to monitor for safety.    

## 2022-09-18 NOTE — ED Notes (Signed)
Pt is in the bed sleeping. Respiration are even and unlabored. No acute distress noted. Will continue to monitor for safety. 

## 2022-09-19 DIAGNOSIS — F339 Major depressive disorder, recurrent, unspecified: Secondary | ICD-10-CM | POA: Diagnosis not present

## 2022-09-19 MED ORDER — ATORVASTATIN CALCIUM 40 MG PO TABS: 40.0000 mg | ORAL_TABLET | Freq: Every day | ORAL | 0 refills | Status: DC

## 2022-09-19 MED ORDER — OLANZAPINE 2.5 MG PO TABS: 2.5000 mg | ORAL_TABLET | Freq: Every day | ORAL | 0 refills | Status: DC

## 2022-09-19 MED ORDER — THIAMINE HCL 100 MG PO TABS: 100.0000 mg | ORAL_TABLET | Freq: Every day | ORAL | 0 refills | Status: AC

## 2022-09-19 MED ORDER — AMLODIPINE BESYLATE 10 MG PO TABS: 10.0000 mg | ORAL_TABLET | Freq: Every day | ORAL | 0 refills | Status: DC

## 2022-09-19 MED ORDER — OXCARBAZEPINE 150 MG PO TABS: 150.0000 mg | ORAL_TABLET | Freq: Two times a day (BID) | ORAL | 0 refills | Status: DC

## 2022-09-19 MED ORDER — SERTRALINE HCL 50 MG PO TABS: 50.0000 mg | ORAL_TABLET | Freq: Every day | ORAL | 0 refills | Status: DC

## 2022-09-19 NOTE — BH Specialist Note (Signed)
Group Session   LCSW facilitated group session to provide psycho-education on the brain's amygdala and stress response when faced with either threatening or overwhelming situation; discussed and demonstrated mindfulness skills used to regulate overstimulation; discussion on effective communication, specifically with taking personal accountability and recognizing when actions have an impact on others.   Pt presented on time for the group.  Pt presented as being in a neutral mood with a congruent affect.  Pt appeared to be attentive and quiet throughout the group discussion as he did not provide input or feedback.  Pt's interpersonal interactions appeared to be appropriate to the facilitator and peers in the group.     Omelia Blackwater, MSW, [place] phone [phone] work mobile
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
## 2022-09-19 NOTE — ED Notes (Signed)
Patient calm, quiet and pleasant.  Scheduled for discharge.  Will monitor.

## 2022-09-19 NOTE — ED Notes (Signed)
Pt is in the bed sleeping. Respirations are even and unlabored. No acute distress noted. Will continue to monitor for safety. 

## 2022-09-28 ENCOUNTER — Emergency Department (HOSPITAL_COMMUNITY)
Admission: EM | Admit: 2022-09-28 | Discharge: 2022-09-29 | Disposition: A | Discharge status: Home / Self Care | Payer: Medicare Other | Attending: Emergency Medicine | Admitting: Emergency Medicine

## 2022-09-28 ENCOUNTER — Other Ambulatory Visit: Payer: Self-pay

## 2022-09-28 ENCOUNTER — Encounter (HOSPITAL_COMMUNITY): Payer: Self-pay | Admitting: Emergency Medicine

## 2022-09-28 DIAGNOSIS — R45851 Suicidal ideations: Secondary | ICD-10-CM | POA: Insufficient documentation

## 2022-09-28 DIAGNOSIS — F430 Acute stress reaction: Secondary | ICD-10-CM

## 2022-09-28 DIAGNOSIS — Z20822 Contact with and (suspected) exposure to covid-19: Secondary | ICD-10-CM | POA: Insufficient documentation

## 2022-09-28 DIAGNOSIS — Z59819 Housing instability, housed unspecified: Secondary | ICD-10-CM

## 2022-09-28 DIAGNOSIS — Y900 Blood alcohol level of less than 20 mg/100 ml: Secondary | ICD-10-CM | POA: Insufficient documentation

## 2022-09-28 DIAGNOSIS — F419 Anxiety disorder, unspecified: Secondary | ICD-10-CM

## 2022-09-28 DIAGNOSIS — F259 Schizoaffective disorder, unspecified: Secondary | ICD-10-CM | POA: Diagnosis not present

## 2022-09-28 DIAGNOSIS — Z046 Encounter for general psychiatric examination, requested by authority: Secondary | ICD-10-CM | POA: Insufficient documentation

## 2022-09-28 DIAGNOSIS — F1990 Other psychoactive substance use, unspecified, uncomplicated: Secondary | ICD-10-CM

## 2022-09-28 LAB — COMPREHENSIVE METABOLIC PANEL
ALT: 12 U/L (ref 0–44)
AST: 14 U/L — ABNORMAL LOW (ref 15–41)
Albumin: 3.4 g/dL — ABNORMAL LOW (ref 3.5–5.0)
Alkaline Phosphatase: 73 U/L (ref 38–126)
Anion gap: 10 (ref 5–15)
BUN: 27 mg/dL — ABNORMAL HIGH (ref 8–23)
CO2: 18 mmol/L — ABNORMAL LOW (ref 22–32)
Calcium: 8.9 mg/dL (ref 8.9–10.3)
Chloride: 111 mmol/L (ref 98–111)
Creatinine, Ser: 0.94 mg/dL (ref 0.61–1.24)
GFR, Estimated: >60 mL/min (ref >60)
Glucose, Bld: 99 mg/dL (ref 70–99)
Potassium: 3.7 mmol/L (ref 3.5–5.1)
Sodium: 139 mmol/L (ref 135–145)
Total Bilirubin: 0.6 mg/dL (ref 0.3–1.2)
Total Protein: 5.8 g/dL — ABNORMAL LOW (ref 6.5–8.1)

## 2022-09-28 LAB — CBC
HCT: 42.3 % (ref 39.0–52.0)
Hemoglobin: 14.4 g/dL (ref 13.0–17.0)
MCH: 32.4 pg (ref 26.0–34.0)
MCHC: 34 g/dL (ref 30.0–36.0)
MCV: 95.1 fL (ref 80.0–100.0)
Platelets: 421 10*3/uL — ABNORMAL HIGH (ref 150–400)
RBC: 4.45 MIL/uL (ref 4.22–5.81)
RDW: 12.7 % (ref 11.5–15.5)
WBC: 11.9 10*3/uL — ABNORMAL HIGH (ref 4.0–10.5)
nRBC: 0 % (ref 0.0–0.2)

## 2022-09-28 LAB — RESP PANEL BY RT-PCR (FLU A&B, COVID) ARPGX2
Influenza A by PCR: NEGATIVE
Influenza B by PCR: NEGATIVE
SARS Coronavirus 2 by RT PCR: NEGATIVE

## 2022-09-28 LAB — ETHANOL: Alcohol, Ethyl (B): <10 mg/dL (ref <10)

## 2022-09-28 MED ORDER — OXCARBAZEPINE 150 MG PO TABS
150.0000 mg | ORAL_TABLET | Freq: Two times a day (BID) | ORAL | Status: DC
  Administered 2022-09-28 – 2022-09-29 (×2): 150 mg via ORAL
  Filled 2022-09-28 (×3): qty 1

## 2022-09-28 MED ORDER — LORAZEPAM 1 MG PO TABS: 1.0000 mg | ORAL_TABLET | ORAL | Status: DC | PRN

## 2022-09-28 MED ORDER — NICOTINE 21 MG/24HR TD PT24
21.0000 mg | MEDICATED_PATCH | Freq: Every day | TRANSDERMAL | Status: DC
  Administered 2022-09-28 – 2022-09-29 (×2): 21 mg via TRANSDERMAL
  Filled 2022-09-28 (×2): qty 1

## 2022-09-28 MED ORDER — OLANZAPINE 2.5 MG PO TABS
2.5000 mg | ORAL_TABLET | Freq: Every day | ORAL | Status: DC
  Administered 2022-09-28: 2.5 mg via ORAL
  Filled 2022-09-28 (×3): qty 1

## 2022-09-28 MED ORDER — ACETAMINOPHEN 325 MG PO TABS: 650.0000 mg | ORAL_TABLET | ORAL | Status: DC | PRN

## 2022-09-28 MED ORDER — SERTRALINE HCL 50 MG PO TABS
50.0000 mg | ORAL_TABLET | Freq: Every day | ORAL | Status: DC
  Administered 2022-09-28 – 2022-09-29 (×2): 50 mg via ORAL
  Filled 2022-09-28 (×2): qty 1

## 2022-09-28 MED ORDER — ALUM & MAG HYDROXIDE-SIMETH 200-200-20 MG/5ML PO SUSP: 30.0000 mL | Freq: Four times a day (QID) | ORAL | Status: DC | PRN

## 2022-09-28 MED ORDER — ONDANSETRON HCL 4 MG PO TABS: 4.0000 mg | ORAL_TABLET | Freq: Three times a day (TID) | ORAL | Status: DC | PRN

## 2022-09-28 MED ORDER — ZIPRASIDONE MESYLATE 20 MG IM SOLR: 20.0000 mg | INTRAMUSCULAR | Status: DC | PRN

## 2022-09-28 MED ORDER — ZOLPIDEM TARTRATE 5 MG PO TABS: 5.0000 mg | ORAL_TABLET | Freq: Every evening | ORAL | Status: DC | PRN

## 2022-09-28 MED ORDER — AMLODIPINE BESYLATE 5 MG PO TABS
10.0000 mg | ORAL_TABLET | Freq: Every day | ORAL | Status: DC
  Administered 2022-09-28 – 2022-09-29 (×2): 10 mg via ORAL
  Filled 2022-09-28 (×3): qty 2

## 2022-09-28 MED ORDER — RISPERIDONE 1 MG PO TBDP: 2.0000 mg | ORAL_TABLET | Freq: Three times a day (TID) | ORAL | Status: DC | PRN

## 2022-09-28 NOTE — ED Provider Notes (Signed)
[facility] EMERGENCY DEPARTMENT Provider Note   CSN: [id number] Arrival date & time: 09/28/22  1543     History  Chief Complaint  Patient presents with   Suicidal    Tannen Vandezande is a 66 y.o. male.  The history is provided by the patient and medical records. No language interpreter was used.     Kennett Symes is a 66yo male with PMHx of Bipolar depression and previous mental health hospitalizations who presents to ED with complaints of depression and SI. He states that he has been feeling this way for the past 3-4 days. He states that he has a plan to end his life by walking out into traffic. He denies HI or AVH. He states he gets about 2 hrs of sleep per night and does not feel rested.He has been treated in the past with Lithium. Pt states he occasionally uses marijuana and used cocaine about 2 wks ago. He denies chest pain, HA, visual changes, weakness. He states that he wants help with is depression Sx during this admission.  Home Medications Prior to Admission medications   Medication Sig Start Date End Date Taking? Authorizing Provider  amLODipine (NORVASC) 10 MG tablet Take 1 tablet (10 mg total) by mouth daily. 09/19/22 10/19/22  Princess Bruins, DO  atorvastatin (LIPITOR) 40 MG tablet Take 1 tablet (40 mg total) by mouth at bedtime. 09/19/22 10/19/22  Princess Bruins, DO  OLANZapine (ZYPREXA) 2.5 MG tablet Take 1 tablet (2.5 mg total) by mouth at bedtime. 09/19/22 10/19/22  Princess Bruins, DO  OXcarbazepine (TRILEPTAL) 150 MG tablet Take 1 tablet (150 mg total) by mouth 2 (two) times daily. 09/19/22 10/19/22  Princess Bruins, DO  sertraline (ZOLOFT) 50 MG tablet Take 1 tablet (50 mg total) by mouth daily. 09/19/22 10/19/22  Princess Bruins, DO  thiamine (VITAMIN B1) 100 MG tablet Take 1 tablet (100 mg total) by mouth daily. 09/19/22 10/19/22  Princess Bruins, DO      Allergies    Prolixin [fluphenazine]    Review of Systems   Review of Systems  All other systems  reviewed and are negative.   Physical Exam Updated Vital Signs BP 135/79 (BP Location: Right Arm)   Pulse 68   Temp 98.2 F (36.8 C) (Oral)   Resp 16   Ht 5\' 6"  (1.676 m)   Wt 77.1 kg   BMI 27.44 kg/m  Physical Exam Vitals and nursing note reviewed.  Constitutional:      General: He is not in acute distress.    Appearance: He is well-developed.  HENT:     Head: Atraumatic.  Eyes:     Conjunctiva/sclera: Conjunctivae normal.  Cardiovascular:     Rate and Rhythm: Normal rate and regular rhythm.     Pulses: Normal pulses.     Heart sounds: Normal heart sounds.  Pulmonary:     Effort: Pulmonary effort is normal.     Breath sounds: Normal breath sounds.  Abdominal:     Palpations: Abdomen is soft.     Tenderness: There is no abdominal tenderness.  Musculoskeletal:     Cervical back: Neck supple.  Skin:    Findings: No rash.  Neurological:     Mental Status: He is alert.  Psychiatric:        Mood and Affect: Mood normal.        Behavior: Behavior is cooperative.        Thought Content: Thought content is not paranoid. Thought content includes suicidal ideation. Thought  content does not include homicidal ideation.     ED Results / Procedures / Treatments   Labs (all labs ordered are listed, but only abnormal results are displayed) Labs Reviewed  COMPREHENSIVE METABOLIC PANEL - Abnormal; Notable for the following components:      Result Value   CO2 18 (*)    BUN 27 (*)    Total Protein 5.8 (*)    Albumin 3.4 (*)    AST 14 (*)    All other components within normal limits  CBC - Abnormal; Notable for the following components:   WBC 11.9 (*)    Platelets 421 (*)    All other components within normal limits  RESP PANEL BY RT-PCR (FLU A&B, COVID) ARPGX2  ETHANOL  RAPID URINE DRUG SCREEN, HOSP PERFORMED    EKG None  Radiology No results found.  Procedures Procedures    Medications Ordered in ED Medications  risperiDONE (RISPERDAL M-TABS) disintegrating  tablet 2 mg (has no administration in time range)    And  LORazepam (ATIVAN) tablet 1 mg (has no administration in time range)    And  ziprasidone (GEODON) injection 20 mg (has no administration in time range)  acetaminophen (TYLENOL) tablet 650 mg (has no administration in time range)  zolpidem (AMBIEN) tablet 5 mg (has no administration in time range)  ondansetron (ZOFRAN) tablet 4 mg (has no administration in time range)  alum & mag hydroxide-simeth (MAALOX/MYLANTA) 200-200-20 MG/5ML suspension 30 mL (has no administration in time range)  nicotine (NICODERM CQ - dosed in mg/24 hours) patch 21 mg (21 mg Transdermal Patch Applied 09/28/22 2019)  amLODipine (NORVASC) tablet 10 mg (10 mg Oral Given 09/28/22 2019)  OLANZapine (ZYPREXA) tablet 2.5 mg (2.5 mg Oral Given 09/28/22 2316)  OXcarbazepine (TRILEPTAL) tablet 150 mg (150 mg Oral Given 09/28/22 2316)  sertraline (ZOLOFT) tablet 50 mg (50 mg Oral Given 09/28/22 2019)    ED Course/ Medical Decision Making/ A&P                           Medical Decision Making Amount and/or Complexity of Data Reviewed Labs: ordered.  Risk OTC drugs. Prescription drug management.   BP 135/79 (BP Location: Right Arm)   Pulse 68   Temp 98.2 F (36.8 C) (Oral)   Resp 16   Ht 5\' 6"  (1.676 m)   Wt 77.1 kg   BMI 27.44 kg/m   51:88 PM 66 year old male with significant history of schizo affective disorder, recurrent suicidal ideation, polysubstance use, brought here accompanied by [employer] with complaints of suicidal ideation.  Patient report for the past 3 to 4 days he has had recurrent suicidal thoughts.  He has thought about running in front of traffic to kill himself.  He does not endorse any homicidal ideation auditory or visual hallucination.  He admits to substance use including cocaine last use several weeks ago.  He attributed his suicidal ideation due to being homelessness.  He has been homeless for the past year.  He denies any change in his eating or  sleeping habit.  He denies any recent medication changes.  He denies alcohol use.  He is here voluntarily.  Patient does not endorse any active pain.  On exam this is a well-appearing male laying in bed appears to be in no acute discomfort.  Heart and lungs are normal.  Abdomen is soft nontender.  Vital signs normal and stable.  No hypoxia, no fever.  Will perform medical  screening exam and will request TTS.  Further psychiatric management.  9:11 PM Labs obtained independently viewed interpreted by me.  No significant electrolyte derangement.  Patient is medically cleared and can be assessed further by psychiatry.  If in the event patient request to be discharged I felt he is stable to be discharged with psych resources and outpatient follow-up with behavioral health urgent care  This patient presents to the ED for concern of suicidal ideation, this involves an extensive number of treatment options, and is a complaint that carries with it a high risk of complications and morbidity.  The differential diagnosis includes depression, substance use psychosis, mood instability, medical illness  Co morbidities that complicate the patient evaluation schizophrenia  bipolar Additional history obtained:  Additional history obtained from [facility] External records from outside source obtained and reviewed including EMR  Lab Tests:  I Ordered, and personally interpreted labs.  The pertinent results include:  as above   Cardiac Monitoring:  The patient was maintained on a cardiac monitor.  I personally viewed and interpreted the cardiac monitored which showed an underlying rhythm of: NSR  Medicines ordered and prescription drug management:  I ordered medication including PRN medication  for PRN  Reevaluation of the patient after these medicines showed that the patient improved I have reviewed the patients home medicines and have made adjustments as needed  Test Considered: as above  Critical  Interventions: psych eval  Consultations Obtained:  I requested consultation with the TTS,  and discussed lab and imaging findings as well as pertinent plan - they recommend: pending  Problem List / ED Course: SI  Reevaluation:  After the interventions noted above, I reevaluated the patient and found that they have :stayed the same  Social Determinants of Health: polysubstance use  Dispostion:  After consideration of the diagnostic results and the patients response to treatment, I feel that the patent would benefit from psych eval.         Final Clinical Impression(s) / ED Diagnoses Final diagnoses:  Suicidal thoughts    Rx / DC Orders ED Discharge Orders     None         Fayrene Helper, PA-C 09/28/22 2325    Cathren Laine, MD 09/29/22 1331

## 2022-09-28 NOTE — ED Notes (Signed)
This patient is voluntary for now form is attached to the clip board in blue zone

## 2022-09-28 NOTE — ED Triage Notes (Signed)
Pt reports for the last couple of days has been having thoughts of self harm. Pt states he wants to jump in front of a truck. Denies hallucinations. Alert, oriented x4. Appears anxious. VSS.

## 2022-09-28 NOTE — ED Notes (Signed)
Pt's belongings inventoried and placed in locker #5 

## 2022-09-29 LAB — RAPID URINE DRUG SCREEN, HOSP PERFORMED
Amphetamines: NOT DETECTED
Barbiturates: NOT DETECTED
Benzodiazepines: NOT DETECTED
Cocaine: POSITIVE — AB
Opiates: NOT DETECTED
Tetrahydrocannabinol: NOT DETECTED

## 2022-09-29 NOTE — ED Notes (Signed)
Review of patient H21 clipboard in blue zone reveals patients remains voluntary for now.

## 2022-09-29 NOTE — BH Assessment (Signed)
Clinician messaged Prudy Feeler. Hall Busing, RN: "Hey. It's Trey with TTS. Is the pt able to engage in the assessment, if so the pt will need to be placed in a private room. Is the pt under IVC? Also is the pt medically cleared?"   Clinician awaiting response.    Vertell Novak, [place], [facility], [facility] Triage Specialist [phone]

## 2022-09-29 NOTE — Discharge Instructions (Addendum)
It was our pleasure to provide your ER care today - we hope that you feel better.  Avoid drug use as it is harmful to your physical health and mental well-being. See resource guide attached in terms of accessing inpatient or outpatient substance use treatment programs.  Also see resource guide attached in terms of other social service resources in the area.  Follow up closely with primary care doctor and behavioral health provider in the coming week.  For mental health issues and/or crisis, you may also go directly to the [facility] - they are open 24/7 and walk-ins are welcome.    Return to ER if worse, new symptoms, fevers, chest pain, trouble breathing, or other emergency concern.
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
## 2022-09-29 NOTE — ED Provider Notes (Signed)
Emergency Medicine Observation Re-evaluation Note  Stephen Delacruz is a 66 y.o. male, seen on rounds today.  Pt initially presented to the ED for complaints of stress, housing instability, and related feelings of anxiety/depression.   Physical Exam  BP 119/80   Pulse 75   Temp 97.9 F (36.6 C)   Resp 15   Ht 1.676 m (5\' 6" )   Wt 77.1 kg   SpO2 100%   BMI 27.44 kg/m  Physical Exam General: alert, content, no distress.  Cardiac: regular rate.  Lungs: breathing comfortably. Psych: normal mood and affect. Pt does not appear acutely depressed or despondent. No SI/HI.  Pt is not responding to internal stimuli - no delusions or hallucinations noted.   ED Course / MDM    I have reviewed the labs performed to date as well as medications administered while in observation.  Recent changes in the last 24 hours include ED obs, BH eval, reassessment.   Plan  [facility] team has evaluated and indicates pt is psych clear for d/c.   Pt w normal appetite. Is eating and drinking. Normal vitals. No new c/o today.   Pt currently appears stable for d/c.   Rec close outpatient pcp and bh f/u. Additional social service/shelter resources provided.   Return precautions provided.       Lajean Saver, MD 09/29/22 1410

## 2022-09-29 NOTE — Progress Notes (Signed)
Pt has been pysch cleared per Derrill Center, NP. This CSW will now remove pt from the [facility] shift report. TOC will assist with any discharge needs.   Benjaman Kindler, MSW, LCSWA 09/29/2022 3:14 PM
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
## 2022-10-06 ENCOUNTER — Encounter (HOSPITAL_COMMUNITY): Payer: Self-pay

## 2022-10-06 ENCOUNTER — Emergency Department (HOSPITAL_COMMUNITY)
Admission: EM | Admit: 2022-10-06 | Discharge: 2022-10-06 | Disposition: A | Discharge status: Home / Self Care | Payer: Medicare Other | Attending: Emergency Medicine | Admitting: Emergency Medicine

## 2022-10-06 ENCOUNTER — Other Ambulatory Visit: Payer: Self-pay

## 2022-10-06 DIAGNOSIS — I10 Essential (primary) hypertension: Secondary | ICD-10-CM | POA: Diagnosis not present

## 2022-10-06 DIAGNOSIS — R000 Tachycardia, unspecified: Secondary | ICD-10-CM | POA: Diagnosis not present

## 2022-10-06 DIAGNOSIS — R531 Weakness: Secondary | ICD-10-CM | POA: Diagnosis present

## 2022-10-06 DIAGNOSIS — D72829 Elevated white blood cell count, unspecified: Secondary | ICD-10-CM | POA: Diagnosis not present

## 2022-10-06 DIAGNOSIS — Z79899 Other long term (current) drug therapy: Secondary | ICD-10-CM | POA: Diagnosis not present

## 2022-10-06 LAB — URINALYSIS, ROUTINE W REFLEX MICROSCOPIC
Bilirubin Urine: NEGATIVE
Glucose, UA: NEGATIVE mg/dL
Hgb urine dipstick: NEGATIVE
Ketones, ur: NEGATIVE mg/dL
Leukocytes,Ua: NEGATIVE
Nitrite: NEGATIVE
Protein, ur: NEGATIVE mg/dL
Specific Gravity, Urine: 1.006 (ref 1.005–1.030)
pH: 6 (ref 5.0–8.0)

## 2022-10-06 LAB — CBC
HCT: 42 % (ref 39.0–52.0)
Hemoglobin: 14.2 g/dL (ref 13.0–17.0)
MCH: 32.3 pg (ref 26.0–34.0)
MCHC: 33.8 g/dL (ref 30.0–36.0)
MCV: 95.5 fL (ref 80.0–100.0)
Platelets: 339 10*3/uL (ref 150–400)
RBC: 4.4 MIL/uL (ref 4.22–5.81)
RDW: 12.8 % (ref 11.5–15.5)
WBC: 11.7 10*3/uL — ABNORMAL HIGH (ref 4.0–10.5)
nRBC: 0 % (ref 0.0–0.2)

## 2022-10-06 LAB — BASIC METABOLIC PANEL
Anion gap: 8 (ref 5–15)
BUN: 11 mg/dL (ref 8–23)
CO2: 23 mmol/L (ref 22–32)
Calcium: 9 mg/dL (ref 8.9–10.3)
Chloride: 110 mmol/L (ref 98–111)
Creatinine, Ser: 1.21 mg/dL (ref 0.61–1.24)
GFR, Estimated: >60 mL/min (ref >60)
Glucose, Bld: 98 mg/dL (ref 70–99)
Potassium: 3.7 mmol/L (ref 3.5–5.1)
Sodium: 141 mmol/L (ref 135–145)

## 2022-10-06 NOTE — ED Triage Notes (Addendum)
Pt BIB [facility]. Pt was [facility] the jail today and was sitting on a bench outside for a few hours and then pt was unable to get up off the bench. Pt with generalized weakness and hypotension with EMS. Weakness was reported to have started yesterday. EMS admin 500 ml of NaCl. Pt is A/Ox4 on arrival.   EMS Vitals  90/58 HR 140 RR 30 94% on R/A CBG 130

## 2022-10-06 NOTE — ED Notes (Signed)
Pt was ambulatory to the restroom with a steady gait.

## 2022-10-06 NOTE — ED Notes (Signed)
Pt provided discharge instructions and prescription information. Pt was given the opportunity to ask questions and questions were answered.   

## 2022-10-06 NOTE — ED Provider Notes (Addendum)
[facility] EMERGENCY DEPARTMENT Provider Note   CSN: [id number] Arrival date & time: 10/06/22  1542     History  Chief Complaint  Patient presents with   Weakness    Stephen Delacruz is a 66 y.o. male.  Patient spent the night in jail.  Brought in by EMS because he was discharged from jail and they saw him sitting on a bench outside for few hours and the patient was unable to get up off the bench.  Patient with generalized weakness and knows his blood pressure was low.  They gave him 500 cc of fluid patient arrived here with a normal blood pressure.  Patient tells me that he is homeless and he was arrested for trespassing.  EMS reported his blood pressure to be 90/58.  Oxygen saturations were 94% on room air blood sugar was 130.  Patient given additional 1 L of fluid here.  Patient states he has urinated.  Patient states he feels better heart rates now down to around 100.  Patient denies any complaints denies any cough any pain chest or abdomen.  Patient able to move all 4 extremities.  Patient states that he is homeless and has been homeless for about a year.  Past medical history significant for mental disorder hypertension posttraumatic stress disorder bipolar disorder history of kidney stones.  Patient admits to drinking less that he had alcohol 2 days ago.  History of binge drinking.       Home Medications Prior to Admission medications   Medication Sig Start Date End Date Taking? Authorizing Provider  amLODipine (NORVASC) 10 MG tablet Take 1 tablet (10 mg total) by mouth daily. 09/19/22 10/19/22  Princess Bruins, DO  atorvastatin (LIPITOR) 40 MG tablet Take 1 tablet (40 mg total) by mouth at bedtime. 09/19/22 10/19/22  Princess Bruins, DO  OLANZapine (ZYPREXA) 2.5 MG tablet Take 1 tablet (2.5 mg total) by mouth at bedtime. 09/19/22 10/19/22  Princess Bruins, DO  OXcarbazepine (TRILEPTAL) 150 MG tablet Take 1 tablet (150 mg total) by mouth 2 (two) times daily. 09/19/22  10/19/22  Princess Bruins, DO  sertraline (ZOLOFT) 50 MG tablet Take 1 tablet (50 mg total) by mouth daily. 09/19/22 10/19/22  Princess Bruins, DO  thiamine (VITAMIN B1) 100 MG tablet Take 1 tablet (100 mg total) by mouth daily. 09/19/22 10/19/22  Princess Bruins, DO      Allergies    Prolixin [fluphenazine]    Review of Systems   Review of Systems  Constitutional:  Negative for chills and fever.  HENT:  Negative for ear pain and sore throat.   Eyes:  Negative for pain and visual disturbance.  Respiratory:  Negative for cough and shortness of breath.   Cardiovascular:  Negative for chest pain and palpitations.  Gastrointestinal:  Negative for abdominal pain and vomiting.  Genitourinary:  Negative for dysuria and hematuria.  Musculoskeletal:  Negative for arthralgias and back pain.  Skin:  Negative for color change and rash.  Neurological:  Positive for weakness. Negative for seizures and syncope.  All other systems reviewed and are negative.   Physical Exam Updated Vital Signs BP 134/78   Pulse 97   Temp 99.4 F (37.4 C) (Oral)   Resp 20   Ht 1.676 m (5\' 6" )   Wt 69.4 kg   SpO2 97%   BMI 24.69 kg/m  Physical Exam Vitals and nursing note reviewed.  Constitutional:      General: He is not in acute distress.    Appearance:  Normal appearance. He is well-developed.  HENT:     Head: Normocephalic and atraumatic.     Mouth/Throat:     Mouth: Mucous membranes are dry.  Eyes:     Conjunctiva/sclera: Conjunctivae normal.     Pupils: Pupils are equal, round, and reactive to light.  Cardiovascular:     Rate and Rhythm: Normal rate and regular rhythm.     Heart sounds: No murmur heard. Pulmonary:     Effort: Pulmonary effort is normal. No respiratory distress.     Breath sounds: Normal breath sounds. No wheezing or rales.  Abdominal:     Palpations: Abdomen is soft.     Tenderness: There is no abdominal tenderness.  Musculoskeletal:        General: No swelling.     Cervical back:  Neck supple.     Right lower leg: No edema.     Left lower leg: No edema.  Skin:    General: Skin is warm and dry.     Capillary Refill: Capillary refill takes less than 2 seconds.  Neurological:     General: No focal deficit present.     Mental Status: He is alert and oriented to person, place, and time.     Cranial Nerves: No cranial nerve deficit.     Sensory: No sensory deficit.     Motor: No weakness.  Psychiatric:        Mood and Affect: Mood normal.     ED Results / Procedures / Treatments   Labs (all labs ordered are listed, but only abnormal results are displayed) Labs Reviewed  CBC - Abnormal; Notable for the following components:      Result Value   WBC 11.7 (*)    All other components within normal limits  BASIC METABOLIC PANEL  URINALYSIS, ROUTINE W REFLEX MICROSCOPIC  CBG MONITORING, ED    EKG EKG Interpretation  Date/Time:  Friday October 06 2022 16:08:08 EDT Ventricular Rate:  112 PR Interval:  184 QRS Duration: 83 QT Interval:  316 QTC Calculation: 432 R Axis:   96 Text Interpretation: Sinus tachycardia Right axis deviation Confirmed by Fredia Sorrow [phone]) on 10/06/2022 4:14:43 PM  Radiology No results found.  Procedures Procedures    Medications Ordered in ED Medications - No data to display  ED Course/ Medical Decision Making/ A&P                           Medical Decision Making Amount and/or Complexity of Data Reviewed Labs: ordered.   Patient seemed to improve with an additional 1 L of fluids with a total of 1.5 L of fluid.  We will have them ambulate him to see if he is ambulatory.  Patient's EKG was a sinus tachycardia.  Blood sugars are normal.  Patient up and walking without difficulty.  But mucous membranes were dry.  Organ to go ahead and give him a second liter of fluids and then he should be good for discharge.  Just to make sure he is well-hydrated.  Basic metabolic panel without significant abnormalities CBC  white count was 11.7 hemoglobin 14.2.     Final Clinical Impression(s) / ED Diagnoses Final diagnoses:  Generalized weakness    Rx / DC Orders ED Discharge Orders     None         Fredia Sorrow, MD 10/06/22 1716    Fredia Sorrow, MD 10/06/22 1900

## 2022-10-06 NOTE — Discharge Instructions (Signed)
Continue to hydrate yourself well.  Return for any new or worse symptoms.

## 2022-10-08 ENCOUNTER — Encounter (HOSPITAL_COMMUNITY): Payer: Self-pay | Admitting: Emergency Medicine

## 2022-10-08 ENCOUNTER — Emergency Department (HOSPITAL_COMMUNITY)
Admission: EM | Admit: 2022-10-08 | Discharge: 2022-10-09 | Discharge status: Against Medical Advice | Payer: Medicare Other | Source: Home / Self Care

## 2022-10-08 ENCOUNTER — Emergency Department (HOSPITAL_COMMUNITY)
Admission: EM | Admit: 2022-10-08 | Discharge: 2022-10-08 | Discharge status: Against Medical Advice | Payer: Medicare Other | Attending: Emergency Medicine | Admitting: Emergency Medicine

## 2022-10-08 DIAGNOSIS — Z5321 Procedure and treatment not carried out due to patient leaving prior to being seen by health care provider: Secondary | ICD-10-CM | POA: Insufficient documentation

## 2022-10-08 DIAGNOSIS — Z5900 Homelessness unspecified: Secondary | ICD-10-CM | POA: Insufficient documentation

## 2022-10-08 DIAGNOSIS — R251 Tremor, unspecified: Secondary | ICD-10-CM | POA: Insufficient documentation

## 2022-10-08 DIAGNOSIS — X31XXXA Exposure to excessive natural cold, initial encounter: Secondary | ICD-10-CM | POA: Diagnosis not present

## 2022-10-08 DIAGNOSIS — J00 Acute nasopharyngitis [common cold]: Secondary | ICD-10-CM | POA: Insufficient documentation

## 2022-10-08 DIAGNOSIS — T699XXA Effect of reduced temperature, unspecified, initial encounter: Secondary | ICD-10-CM | POA: Insufficient documentation

## 2022-10-08 NOTE — ED Provider Triage Note (Signed)
Emergency Medicine Provider Triage Evaluation Note  Stephen Delacruz , a 67 y.o. male  was evaluated in triage.  Pt complains of getting very cold secondary to living outside as a homeless individual.  States that he at baseline has bilateral upper extremity tremor, exacerbated this time due to his cold.  Denies getting wet.  Review of Systems  Positive: As above Negative: Chest pain shortness of breath nausea vomiting diarrhea syncope  Physical Exam  BP (!) 187/101 (BP Location: Right Arm)   Pulse 89   Temp (!) 97.4 F (36.3 C) (Oral)   Resp 16   SpO2 99%  Gen:   Awake, no distress   Resp:  Normal effort  MSK:   Moves extremities without difficulty  Other:  Shivering, hands and feet cool to the touch but with normal capillary refill.  1+ pedal and radial pulses bilaterally.  Normal heart rate with regular rhythm, lungs CTA B.  Medical Decision Making  Medically screening exam initiated at 11:10 PM.  Appropriate orders placed.  Stephen Delacruz was informed that the remainder of the evaluation will be completed by another provider, this initial triage assessment does not replace that evaluation, and the importance of remaining in the ED until their evaluation is complete.  Patient recently seen and treated for dehydration; will obtain basic labs.  Oral temp 97 F, patient wrapped in warm blankets and provided new socks as well as something to eat.  We will continue to monitor.  This chart was dictated using voice recognition software, Dragon. Despite the best efforts of this provider to proofread and correct errors, errors may still occur which can change documentation meaning.    Emeline Darling, PA-C 10/08/22 2320

## 2022-10-08 NOTE — ED Triage Notes (Signed)
Pt reports "I got cold outside tonight."  Pt reports he is homeless and got very cold.  NO other complaints at this time.

## 2022-10-08 NOTE — ED Triage Notes (Signed)
Patient is homeless, here with complaint of feeling very cold and living outside. Patient is alert, oriented, and in no apparent distress at this time.

## 2022-10-08 NOTE — ED Notes (Signed)
Called for pt around 1345 by Jenna(NT) for vitals, no response. This NT called pt x3 for vitals, no response at 1419.

## 2022-10-08 NOTE — ED Notes (Signed)
4 warm blankets given to pt.

## 2022-10-09 ENCOUNTER — Encounter (HOSPITAL_COMMUNITY): Payer: Self-pay | Admitting: *Deleted

## 2022-10-09 ENCOUNTER — Emergency Department (HOSPITAL_COMMUNITY)
Admission: EM | Admit: 2022-10-09 | Discharge: 2022-10-10 | Disposition: A | Discharge status: Home / Self Care | Payer: Medicare Other | Attending: Emergency Medicine | Admitting: Emergency Medicine

## 2022-10-09 ENCOUNTER — Other Ambulatory Visit: Payer: Self-pay

## 2022-10-09 DIAGNOSIS — Z87891 Personal history of nicotine dependence: Secondary | ICD-10-CM | POA: Insufficient documentation

## 2022-10-09 DIAGNOSIS — Z5900 Homelessness unspecified: Secondary | ICD-10-CM | POA: Diagnosis not present

## 2022-10-09 DIAGNOSIS — Z79899 Other long term (current) drug therapy: Secondary | ICD-10-CM | POA: Diagnosis not present

## 2022-10-09 DIAGNOSIS — F339 Major depressive disorder, recurrent, unspecified: Secondary | ICD-10-CM | POA: Diagnosis present

## 2022-10-09 DIAGNOSIS — R45851 Suicidal ideations: Secondary | ICD-10-CM | POA: Insufficient documentation

## 2022-10-09 LAB — CBC WITH DIFFERENTIAL/PLATELET
Abs Immature Granulocytes: 0.03 10*3/uL (ref 0.00–0.07)
Basophils Absolute: 0 10*3/uL (ref 0.0–0.1)
Basophils Relative: 0 %
Eosinophils Absolute: 0.2 10*3/uL (ref 0.0–0.5)
Eosinophils Relative: 2 %
HCT: 47 % (ref 39.0–52.0)
Hemoglobin: 15.9 g/dL (ref 13.0–17.0)
Immature Granulocytes: 0 %
Lymphocytes Relative: 21 %
Lymphs Abs: 2.4 10*3/uL (ref 0.7–4.0)
MCH: 32.2 pg (ref 26.0–34.0)
MCHC: 33.8 g/dL (ref 30.0–36.0)
MCV: 95.1 fL (ref 80.0–100.0)
Monocytes Absolute: 0.8 10*3/uL (ref 0.1–1.0)
Monocytes Relative: 7 %
Neutro Abs: 8 10*3/uL — ABNORMAL HIGH (ref 1.7–7.7)
Neutrophils Relative %: 70 %
Platelets: 358 10*3/uL (ref 150–400)
RBC: 4.94 MIL/uL (ref 4.22–5.81)
RDW: 12.8 % (ref 11.5–15.5)
WBC: 11.4 10*3/uL — ABNORMAL HIGH (ref 4.0–10.5)
nRBC: 0 % (ref 0.0–0.2)

## 2022-10-09 LAB — COMPREHENSIVE METABOLIC PANEL
ALT: 38 U/L (ref 0–44)
AST: 49 U/L — ABNORMAL HIGH (ref 15–41)
Albumin: 3.9 g/dL (ref 3.5–5.0)
Alkaline Phosphatase: 97 U/L (ref 38–126)
Anion gap: 16 — ABNORMAL HIGH (ref 5–15)
BUN: 26 mg/dL — ABNORMAL HIGH (ref 8–23)
CO2: 20 mmol/L — ABNORMAL LOW (ref 22–32)
Calcium: 9.4 mg/dL (ref 8.9–10.3)
Chloride: 103 mmol/L (ref 98–111)
Creatinine, Ser: 1.21 mg/dL (ref 0.61–1.24)
GFR, Estimated: >60 mL/min (ref >60)
Glucose, Bld: 84 mg/dL (ref 70–99)
Potassium: 3.8 mmol/L (ref 3.5–5.1)
Sodium: 139 mmol/L (ref 135–145)
Total Bilirubin: 0.7 mg/dL (ref 0.3–1.2)
Total Protein: 7 g/dL (ref 6.5–8.1)

## 2022-10-09 NOTE — ED Notes (Signed)
CALLED [facility] And WENT TRIAGE LOOKING FOR PATIENT

## 2022-10-09 NOTE — ED Triage Notes (Signed)
Pt is states he is homeless and has just been feeling so cold. Pt states he has thoughts of hurting himself. Pt states he has not done anything to himself today, but states he has a plan to jump infront of a truck.

## 2022-10-10 DIAGNOSIS — Z5900 Homelessness unspecified: Secondary | ICD-10-CM | POA: Diagnosis not present

## 2022-10-10 LAB — COMPREHENSIVE METABOLIC PANEL
ALT: 28 U/L (ref 0–44)
AST: 29 U/L (ref 15–41)
Albumin: 3.4 g/dL — ABNORMAL LOW (ref 3.5–5.0)
Alkaline Phosphatase: 78 U/L (ref 38–126)
Anion gap: 13 (ref 5–15)
BUN: 30 mg/dL — ABNORMAL HIGH (ref 8–23)
CO2: 21 mmol/L — ABNORMAL LOW (ref 22–32)
Calcium: 9.1 mg/dL (ref 8.9–10.3)
Chloride: 106 mmol/L (ref 98–111)
Creatinine, Ser: 0.98 mg/dL (ref 0.61–1.24)
GFR, Estimated: >60 mL/min (ref >60)
Glucose, Bld: 94 mg/dL (ref 70–99)
Potassium: 4.1 mmol/L (ref 3.5–5.1)
Sodium: 140 mmol/L (ref 135–145)
Total Bilirubin: 0.3 mg/dL (ref 0.3–1.2)
Total Protein: 6.2 g/dL — ABNORMAL LOW (ref 6.5–8.1)

## 2022-10-10 LAB — CBC
HCT: 47.9 % (ref 39.0–52.0)
Hemoglobin: 15.2 g/dL (ref 13.0–17.0)
MCH: 31.8 pg (ref 26.0–34.0)
MCHC: 31.7 g/dL (ref 30.0–36.0)
MCV: 100.2 fL — ABNORMAL HIGH (ref 80.0–100.0)
Platelets: 281 10*3/uL (ref 150–400)
RBC: 4.78 MIL/uL (ref 4.22–5.81)
RDW: 13 % (ref 11.5–15.5)
WBC: 10.2 10*3/uL (ref 4.0–10.5)
nRBC: 0 % (ref 0.0–0.2)

## 2022-10-10 LAB — ETHANOL: Alcohol, Ethyl (B): <10 mg/dL (ref <10)

## 2022-10-10 LAB — SALICYLATE LEVEL: Salicylate Lvl: <7 mg/dL — ABNORMAL LOW (ref 7.0–30.0)

## 2022-10-10 LAB — ACETAMINOPHEN LEVEL: Acetaminophen (Tylenol), Serum: <10 ug/mL — ABNORMAL LOW (ref 10–30)

## 2022-10-10 MED ORDER — OLANZAPINE 2.5 MG PO TABS: 2.5000 mg | ORAL_TABLET | Freq: Every day | ORAL | Status: DC

## 2022-10-10 MED ORDER — AMLODIPINE BESYLATE 5 MG PO TABS
10.0000 mg | ORAL_TABLET | Freq: Every day | ORAL | Status: DC
  Administered 2022-10-10: 10 mg via ORAL
  Filled 2022-10-10: qty 2

## 2022-10-10 MED ORDER — SERTRALINE HCL 50 MG PO TABS
50.0000 mg | ORAL_TABLET | Freq: Every day | ORAL | Status: DC
  Administered 2022-10-10: 50 mg via ORAL
  Filled 2022-10-10: qty 1

## 2022-10-10 MED ORDER — ATORVASTATIN CALCIUM 40 MG PO TABS: 40.0000 mg | ORAL_TABLET | Freq: Every day | ORAL | Status: DC

## 2022-10-10 NOTE — ED Provider Notes (Signed)
Emergency Medicine Observation Re-evaluation Note  Stephen Delacruz is a 66 y.o. male, seen on rounds today.  Pt initially presented to the ED for complaints of Suicidal Currently, the patient is resting in bed, calm, awake, alert, speaking clearly.  Physical Exam  BP 128/81 (BP Location: Left Arm)   Pulse 96   Temp 98 F (36.7 C) (Oral)   Resp 16   SpO2 98%  Physical Exam General: No distress Cardiac: Rate and rhythm Lungs: No increased work of breathing Psych: Calm  ED Course / MDM  EKG:   I have reviewed the labs performed to date as well as medications administered while in observation.  Recent changes in the last 24 hours include designated as appropriate for discharge by behavioral health.  Plan  Current plan is for patient to be discharged to follow-up with outpatient resources.  Patient is aware of this, we discussed it, answered all questions, he has no current physical complaints, is hemodynamically unremarkable.    Stephen Muskrat, MD 10/10/22 [phone]
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
## 2022-10-10 NOTE — ED Provider Notes (Signed)
[facility] EMERGENCY DEPARTMENT Provider Note   CSN: [id number] Arrival date & time: 10/09/22  1354     History  Chief Complaint  Patient presents with   Suicidal    Stephen Delacruz is a 66 y.o. male.  HPI   Patient with medical history including schizophrenia, bipolar, polysubstance dependency presents with complaints of depression and suicide ideations.  Patient states that he has been depressed over the last week, endorses suicidal ideation without a plan, denies any homicidal ideations, denies any illicit drug use, states that he takes Zoloft for his anxiety but has not been taking it, not endorsing any other complaints at this time.    Home Medications Prior to Admission medications   Medication Sig Start Date End Date Taking? Authorizing Provider  amLODipine (NORVASC) 10 MG tablet Take 1 tablet (10 mg total) by mouth daily. 09/19/22 10/19/22  Princess Bruins, DO  atorvastatin (LIPITOR) 40 MG tablet Take 1 tablet (40 mg total) by mouth at bedtime. 09/19/22 10/19/22  Princess Bruins, DO  OLANZapine (ZYPREXA) 2.5 MG tablet Take 1 tablet (2.5 mg total) by mouth at bedtime. 09/19/22 10/19/22  Princess Bruins, DO  OXcarbazepine (TRILEPTAL) 150 MG tablet Take 1 tablet (150 mg total) by mouth 2 (two) times daily. 09/19/22 10/19/22  Princess Bruins, DO  sertraline (ZOLOFT) 50 MG tablet Take 1 tablet (50 mg total) by mouth daily. 09/19/22 10/19/22  Princess Bruins, DO  thiamine (VITAMIN B1) 100 MG tablet Take 1 tablet (100 mg total) by mouth daily. 09/19/22 10/19/22  Princess Bruins, DO      Allergies    Prolixin [fluphenazine]    Review of Systems   Review of Systems  Constitutional:  Negative for chills and fever.  Respiratory:  Negative for shortness of breath.   Cardiovascular:  Negative for chest pain.  Gastrointestinal:  Negative for abdominal pain.  Neurological:  Negative for headaches.    Physical Exam Updated Vital Signs BP 118/79 (BP Location: Right Arm)    Pulse 79   Temp 98 F (36.7 C) (Oral)   Resp 16   SpO2 99%  Physical Exam Vitals and nursing note reviewed.  Constitutional:      General: He is not in acute distress.    Appearance: He is not ill-appearing.  HENT:     Head: Normocephalic and atraumatic.     Nose: No congestion.  Eyes:     Conjunctiva/sclera: Conjunctivae normal.  Cardiovascular:     Rate and Rhythm: Normal rate and regular rhythm.     Pulses: Normal pulses.  Pulmonary:     Effort: Pulmonary effort is normal.  Skin:    General: Skin is warm and dry.  Neurological:     Mental Status: He is alert.     Comments: No facial asymmetry no difficulty with word finding following two-step commands no unilateral weakness present.  Psychiatric:        Mood and Affect: Mood normal.     Comments: Suicide ideation without a plan, denies any homicidal ideations, responding appropriately does not appear to be responding to internal stimuli.     ED Results / Procedures / Treatments   Labs (all labs ordered are listed, but only abnormal results are displayed) Labs Reviewed  COMPREHENSIVE METABOLIC PANEL - Abnormal; Notable for the following components:      Result Value   CO2 21 (*)    BUN 30 (*)    Total Protein 6.2 (*)    Albumin 3.4 (*)  All other components within normal limits  SALICYLATE LEVEL - Abnormal; Notable for the following components:   Salicylate Lvl <0.0 (*)    All other components within normal limits  ACETAMINOPHEN LEVEL - Abnormal; Notable for the following components:   Acetaminophen (Tylenol), Serum <10 (*)    All other components within normal limits  CBC - Abnormal; Notable for the following components:   MCV 100.2 (*)    All other components within normal limits  ETHANOL  RAPID URINE DRUG SCREEN, HOSP PERFORMED    EKG None  Radiology No results found.  Procedures Procedures    Medications Ordered in ED Medications - No data to display  ED Course/ Medical Decision Making/  A&P                           Medical Decision Making  This patient presents to the ED for concern of suicide ideation, this involves an extensive number of treatment options, and is a complaint that carries with it a high risk of complications and morbidity.  The differential diagnosis includes psychiatric emergency, metabolic derailments, withdrawals    Additional history obtained:  Additional history obtained from N/A  External records from outside source obtained and reviewed including his ER notes   Co morbidities that complicate the patient evaluation  Psychiatric history  Social Determinants of Health:  Homeless    Lab Tests:  I Ordered, and personally interpreted labs.  The pertinent results include: CBC unremarkable, CMP shows CO2 21 BUN 30 acetaminophen ethanol negative  Imaging Studies ordered:  I ordered imaging studies including N/A I independently visualized and interpreted imaging which showed an I agree with the radiologist interpretation   Cardiac Monitoring:  The patient was maintained on a cardiac monitor.  I personally viewed and interpreted the cardiac monitored which showed an underlying rhythm of: N/A   Medicines ordered and prescription drug management:  I ordered medication including home medications I have reviewed the patients home medicines and have made adjustments as needed  Critical Interventions:  N/a   Reevaluation:  Suicide ideations will obtain medical clearance.  Medically clear at this time will await TTS recommendations  Consultations Obtained:  TTS pending     Test Considered:  N/a    Rule out Doubt metabolic derailments lab work is all unremarkable.  Doubt withdrawals presentation atypical vital signs reassuring nontremulous on my exam    Dispostion and problem list  After consideration of the diagnostic results and the patients response to treatment, I feel that the patent would benefit from psych  hold, TTS is pending, home meds have been ordered, patient is not IVC at this time if he attempts to leave would recommend reassessment if he can contact for safety and follow-up as outpatient..             Final Clinical Impression(s) / ED Diagnoses Final diagnoses:  Suicidal ideation    Rx / DC Orders ED Discharge Orders     None         Marcello Fennel, PA-C 10/10/22 0545    Ripley Fraise, MD 10/10/22 [phone]

## 2022-10-10 NOTE — Discharge Instructions (Signed)
Cab will take you directly to the [facility] for services  [place]  Hours Monday - Friday: Services: 8:00AM - 3:00PM Offices: 8:00AM - 5:00PM  Physical Address [place], [place] [address]   Please use this address for [facility] Mailing Address [address] [place], [place] [address]  The [facility] helps people reconnect This is a safe place to rest, take care of basic needs and access the services and community that make all the difference. Our guests come to the [facility] to take a class, do laundry, meet with a case manager or to get their mail. Sometimes they just need to sit in our dayroom and enjoy a conversation.  Here you will find everything from shower facilities to a computer lab, a mail room, classrooms and meeting spaces.  The [facility] helps people reconnect with their own lives and with the community at large.  A caring community setting One of the most exciting aspects of the [facility] is that so many individuals and organizations in the community are a part of the everyday experience. Whether it's a hair stylist or law firm offering services right in-house, our partners make the [facility] a truly interactive resource center where services are brought to our guests. The [facility] brings together a comprehensive community of talented people who not only want to help solve problems, but also to be a part of our guests' lives.  Integrated Care We take a person-centered approach to assistance that includes: Case management [place] [facility] Mental health nurse Referrals  Fundamental Services We start with necessities: Engineer, maintenance (IT) and Risk manager addresses and mailboxes Replacement IDs Onsite barbershop Storage lockers [place] winter warming center  Self-Sufficiency We connect our guests with: Skilled trade classes Job skills classes Resume and jobs application assistance Interview training GED Actuary

## 2022-10-10 NOTE — ED Notes (Signed)
ED Provider at bedside. 

## 2022-11-20 ENCOUNTER — Emergency Department (HOSPITAL_COMMUNITY)
Admission: EM | Admit: 2022-11-20 | Discharge: 2022-11-20 | Disposition: A | Discharge status: Home / Self Care | Payer: Medicare Other | Attending: Emergency Medicine | Admitting: Emergency Medicine

## 2022-11-20 ENCOUNTER — Encounter (HOSPITAL_COMMUNITY): Payer: Self-pay

## 2022-11-20 ENCOUNTER — Other Ambulatory Visit: Payer: Self-pay

## 2022-11-20 DIAGNOSIS — Z760 Encounter for issue of repeat prescription: Secondary | ICD-10-CM | POA: Diagnosis present

## 2022-11-20 DIAGNOSIS — I10 Essential (primary) hypertension: Secondary | ICD-10-CM | POA: Diagnosis not present

## 2022-11-20 MED ORDER — OLANZAPINE 2.5 MG PO TABS: 2.5000 mg | ORAL_TABLET | Freq: Every day | ORAL | 0 refills | Status: DC

## 2022-11-20 NOTE — Discharge Instructions (Addendum)
You have been seen today for your complaint of medication refill. Your discharge medications include Zyprexa.  This is a once daily medication.  You should take it daily until you follow-up with psychiatrist. Follow up with: A psychiatrist.  You should call the [facility] listed in this packet to schedule an appointment Please seek immediate medical care if you develop any of the following symptoms:  At this time there does not appear to be the presence of an emergent medical condition, however there is always the potential for conditions to change. Please read and follow the below instructions.  Do not take your medicine if  develop an itchy rash, swelling in your mouth or lips, or difficulty breathing; call 911 and seek immediate emergency medical attention if this occurs.  You may review your lab tests and imaging results in their entirety on your MyChart account.  Please discuss all results of fully with your primary care provider and other specialist at your follow-up visit.  Note: Portions of this text may have been transcribed using voice recognition software. Every effort was made to ensure accuracy; however, inadvertent computerized transcription errors may still be present.

## 2022-11-20 NOTE — ED Provider Notes (Signed)
[facility]-EMERGENCY DEPT Provider Note   CSN: [id number] Arrival date & time: 11/20/22  1605     History  Chief Complaint  Patient presents with   Medication Refill    Stephen Delacruz is a 66 y.o. male.  With a history of PTSD, anxiety, bipolar 1, hypertension who presents to the ED for evaluation of medication refill.  He states he ran out of his Zyprexa approximately 2 weeks ago and would like it refilled.  States he was taking it as prescribed until he ran out.  Has not seen a psychiatrist since it was prescribed 2 months ago.  Currently denying suicidal or homicidal ideations, auditory or visual hallucinations.  Denies all other symptoms.  States he is just here for medication refill.  States he is open to seeing a psychiatrist if he is given resources.   Medication Refill      Home Medications Prior to Admission medications   Medication Sig Start Date End Date Taking? Authorizing Provider  amLODipine (NORVASC) 10 MG tablet Take 1 tablet (10 mg total) by mouth daily. 09/19/22 10/19/22  Princess Bruins, DO  atorvastatin (LIPITOR) 40 MG tablet Take 1 tablet (40 mg total) by mouth at bedtime. 09/19/22 10/19/22  Princess Bruins, DO  OLANZapine (ZYPREXA) 2.5 MG tablet Take 1 tablet (2.5 mg total) by mouth at bedtime. 09/19/22 10/19/22  Princess Bruins, DO  OXcarbazepine (TRILEPTAL) 150 MG tablet Take 1 tablet (150 mg total) by mouth 2 (two) times daily. 09/19/22 10/19/22  Princess Bruins, DO  sertraline (ZOLOFT) 50 MG tablet Take 1 tablet (50 mg total) by mouth daily. 09/19/22 10/19/22  Princess Bruins, DO      Allergies    Prolixin [fluphenazine]    Review of Systems   Review of Systems  All other systems reviewed and are negative.   Physical Exam Updated Vital Signs BP 108/73   Pulse (!) 128   Temp 99.1 F (37.3 C) (Oral)   Resp 18   SpO2 95%  Physical Exam Vitals and nursing note reviewed.  Constitutional:      General: He is not in acute distress.     Appearance: Normal appearance. He is normal weight. He is not ill-appearing, toxic-appearing or diaphoretic.  HENT:     Head: Normocephalic and atraumatic.  Eyes:     Extraocular Movements: Extraocular movements intact.     Pupils: Pupils are equal, round, and reactive to light.  Cardiovascular:     Rate and Rhythm: Normal rate and regular rhythm.     Heart sounds: No murmur heard. Pulmonary:     Effort: Pulmonary effort is normal. No respiratory distress.     Breath sounds: Normal breath sounds.  Abdominal:     General: Abdomen is flat.  Musculoskeletal:        General: Normal range of motion.     Cervical back: Neck supple.  Skin:    General: Skin is warm and dry.  Neurological:     General: No focal deficit present.     Mental Status: He is alert and oriented to person, place, and time.  Psychiatric:        Mood and Affect: Mood normal.        Behavior: Behavior normal.     ED Results / Procedures / Treatments   Labs (all labs ordered are listed, but only abnormal results are displayed) Labs Reviewed - No data to display  EKG None  Radiology No results found.  Procedures Procedures  Medications Ordered in ED Medications - No data to display  ED Course/ Medical Decision Making/ A&P                           Medical Decision Making This patient presents to the ED for concern of medication refill.  Specifically refilling his Zyprexa.   Co morbidities that complicate the patient evaluation   anxiety, bipolar  Additional history obtained from: Nursing notes from this visit.   Afebrile, hemodynamically stable.  66 year old male with history of anxiety and bipolar 1 presents to the ED for evaluation of medication refill.  States he has not had any recent psychotic illnesses.  Currently denying SI, HI, and AVH.  Requesting refill of his Zyprexa.  States he has been taking it as prescribed, but ran out approximately 2 weeks ago.  Has not seen a psychiatrist,  but is open to seeing 1 for further refills.  We will send a 1 month prescription of this and encouraged him to follow-up with psychiatry.  Will send a resource list with him as well.  At this time there does not appear to be any evidence of an acute emergency medical condition and the patient appears stable for discharge with appropriate outpatient follow up. Diagnosis was discussed with patient who verbalizes understanding of care plan and is agreeable to discharge. I have discussed return precautions with patient who verbalizes understanding. Patient encouraged to follow-up with their PCP within 1 week. All questions answered.  Note: Portions of this report may have been transcribed using voice recognition software. Every effort was made to ensure accuracy; however, inadvertent computerized transcription errors may still be present.          Final Clinical Impression(s) / ED Diagnoses Final diagnoses:  None    Rx / DC Orders ED Discharge Orders     None         Roylene Reason, PA-C 11/20/22 1709    Audley Hose, MD 11/20/22 1800

## 2022-11-20 NOTE — ED Triage Notes (Signed)
Patient said he needs a refill for his zyprexa.

## 2022-11-21 ENCOUNTER — Emergency Department (HOSPITAL_COMMUNITY)
Admission: EM | Admit: 2022-11-21 | Discharge: 2022-11-21 | Discharge status: Against Medical Advice | Payer: Medicare Other | Attending: Emergency Medicine | Admitting: Emergency Medicine

## 2022-11-21 ENCOUNTER — Other Ambulatory Visit: Payer: Self-pay

## 2022-11-21 DIAGNOSIS — R45851 Suicidal ideations: Secondary | ICD-10-CM | POA: Insufficient documentation

## 2022-11-21 DIAGNOSIS — Z5321 Procedure and treatment not carried out due to patient leaving prior to being seen by health care provider: Secondary | ICD-10-CM | POA: Diagnosis not present

## 2022-11-21 NOTE — ED Triage Notes (Signed)
Pt bib ems pt states he is suicidal, pt was discharged this morning.

## 2022-11-23 ENCOUNTER — Emergency Department (HOSPITAL_COMMUNITY)
Admission: EM | Admit: 2022-11-23 | Discharge: 2022-11-23 | Disposition: A | Discharge status: Home / Self Care | Payer: Medicare Other | Attending: Emergency Medicine | Admitting: Emergency Medicine

## 2022-11-23 ENCOUNTER — Encounter (HOSPITAL_COMMUNITY): Payer: Self-pay | Admitting: Emergency Medicine

## 2022-11-23 DIAGNOSIS — S39012A Strain of muscle, fascia and tendon of lower back, initial encounter: Secondary | ICD-10-CM | POA: Insufficient documentation

## 2022-11-23 DIAGNOSIS — Y9384 Activity, sleeping: Secondary | ICD-10-CM | POA: Diagnosis not present

## 2022-11-23 DIAGNOSIS — Y999 Unspecified external cause status: Secondary | ICD-10-CM | POA: Insufficient documentation

## 2022-11-23 DIAGNOSIS — X58XXXA Exposure to other specified factors, initial encounter: Secondary | ICD-10-CM | POA: Insufficient documentation

## 2022-11-23 DIAGNOSIS — M545 Low back pain, unspecified: Secondary | ICD-10-CM | POA: Diagnosis present

## 2022-11-23 DIAGNOSIS — Y929 Unspecified place or not applicable: Secondary | ICD-10-CM | POA: Insufficient documentation

## 2022-11-23 MED ORDER — ACETAMINOPHEN 500 MG PO TABS
1000.0000 mg | ORAL_TABLET | Freq: Once | ORAL | Status: AC
  Administered 2022-11-23: 1000 mg via ORAL
  Filled 2022-11-23: qty 2

## 2022-11-23 NOTE — Discharge Instructions (Signed)
Use Tylenol every 4 hours as needed for pain. Return for weakness in your legs, numbness, difficulty urinating or new concerns.

## 2022-11-23 NOTE — ED Triage Notes (Signed)
Pt arrives via EMS from street with reports of back pain.

## 2022-11-23 NOTE — ED Provider Triage Note (Signed)
Emergency Medicine Provider Triage Evaluation Note  Stephen Delacruz , a 66 y.o. male  was evaluated in triage.  Pt complains of low back pain.  Patient reports that he typically sleeps on the ground on his back.  He denies any numbness, weakness, fevers, chest pain, shortness of breath, or loss of bowel or bladder control.  Denies any trauma to the area.  Review of Systems  Positive: Low back pain Negative: As above  Physical Exam  BP (!) 147/88 (BP Location: Right Arm)   Pulse 92   Temp 98.1 F (36.7 C) (Oral)   Resp 16   Ht 5\' 6"  (1.676 m)   Wt 69 kg   SpO2 96%   BMI 24.55 kg/m  Gen:   Awake, no distress   Resp:  Normal effort  MSK:   Moves extremities without difficulty  Other:  Normal DTRs in lower extremities.  Bilateral lower leg weakness.  No loss of sensation.  Medical Decision Making  Medically screening exam initiated at 1:24 PM.  Appropriate orders placed.  Stephen Delacruz was informed that the remainder of the evaluation will be completed by another provider, this initial triage assessment does not replace that evaluation, and the importance of remaining in the ED until their evaluation is complete.    [name], PA-C 11/23/22 1335

## 2022-11-23 NOTE — ED Provider Notes (Signed)
[facility] EMERGENCY DEPARTMENT Provider Note   CSN: [id number] Arrival date & time: 11/23/22  1246     History  Chief Complaint  Patient presents with   Back Pain    Stephen Delacruz is a 66 y.o. male.  Patient presents with lower back pain worse with movement and sleeping on the ground.  Patient is homeless for the past year.  Patient denies any weakness, numbness, fevers, chest pain, shortness of breath, bowel or bladder changes or urinary symptoms.  History of kidney stones this feels different.  Patient admits to lifting heavy items but no direct trauma.       Home Medications Prior to Admission medications   Medication Sig Start Date End Date Taking? Authorizing Provider  amLODipine (NORVASC) 10 MG tablet Take 1 tablet (10 mg total) by mouth daily. 09/19/22 10/19/22  Merrily Brittle, DO  atorvastatin (LIPITOR) 40 MG tablet Take 1 tablet (40 mg total) by mouth at bedtime. 09/19/22 10/19/22  Merrily Brittle, DO  OLANZapine (ZYPREXA) 2.5 MG tablet Take 1 tablet (2.5 mg total) by mouth at bedtime. 11/20/22 12/20/22  Schutt, Grafton Folk, PA-C  OXcarbazepine (TRILEPTAL) 150 MG tablet Take 1 tablet (150 mg total) by mouth 2 (two) times daily. 09/19/22 10/19/22  Merrily Brittle, DO  sertraline (ZOLOFT) 50 MG tablet Take 1 tablet (50 mg total) by mouth daily. 09/19/22 10/19/22  Merrily Brittle, DO      Allergies    Prolixin [fluphenazine]    Review of Systems   Review of Systems  Constitutional:  Negative for chills and fever.  HENT:  Negative for congestion.   Eyes:  Negative for visual disturbance.  Respiratory:  Negative for shortness of breath.   Cardiovascular:  Negative for chest pain.  Gastrointestinal:  Negative for abdominal pain and vomiting.  Genitourinary:  Negative for dysuria and flank pain.  Musculoskeletal:  Positive for back pain. Negative for neck pain and neck stiffness.  Skin:  Negative for rash.  Neurological:  Negative for light-headedness and  headaches.    Physical Exam Updated Vital Signs BP (!) 147/88 (BP Location: Right Arm)   Pulse 92   Temp 98.1 F (36.7 C) (Oral)   Resp 16   Ht 5\' 6"  (1.676 m)   Wt 69 kg   SpO2 96%   BMI 24.55 kg/m  Physical Exam Vitals and nursing note reviewed.  Constitutional:      General: He is not in acute distress.    Appearance: He is well-developed.  HENT:     Head: Normocephalic and atraumatic.     Mouth/Throat:     Mouth: Mucous membranes are moist.  Eyes:     General:        Right eye: No discharge.        Left eye: No discharge.     Conjunctiva/sclera: Conjunctivae normal.  Neck:     Trachea: No tracheal deviation.  Cardiovascular:     Rate and Rhythm: Normal rate.  Pulmonary:     Effort: Pulmonary effort is normal.  Abdominal:     General: There is no distension.     Palpations: Abdomen is soft.     Tenderness: There is no abdominal tenderness. There is no guarding.  Musculoskeletal:        General: Tenderness present. No swelling or deformity.     Cervical back: Normal range of motion and neck supple.     Comments: Patient has mild paraspinal and midline lumbar tenderness no step-off no signs of  infection externally  Skin:    General: Skin is warm.     Capillary Refill: Capillary refill takes less than 2 seconds.     Findings: No rash.  Neurological:     General: No focal deficit present.     Mental Status: He is alert.     Cranial Nerves: No cranial nerve deficit.     Sensory: No sensory deficit.     Motor: No weakness.  Psychiatric:     Comments: Flat affect     ED Results / Procedures / Treatments   Labs (all labs ordered are listed, but only abnormal results are displayed) Labs Reviewed - No data to display  EKG None  Radiology No results found.  Procedures Procedures    Medications Ordered in ED Medications  acetaminophen (TYLENOL) tablet 1,000 mg (has no administration in time range)    ED Course/ Medical Decision Making/ A&P                            Medical Decision Making Risk OTC drugs.   Patient presents with clinical concern for lumbar muscle strain.  No trauma to suggest fracture, no fever or chills or diabetes history or urinary symptoms to suggest infection.  No concern for significant disc herniation or spinal cord pathology.  Patient homeless, food given in the ER.  Discussed options of shelter outpatient.  Discussed x-rays, patient prefers not to obtain at this time.  Tylenol given for pain.  Supportive care discussed.        Final Clinical Impression(s) / ED Diagnoses Final diagnoses:  Lumbar strain, initial encounter    Rx / DC Orders ED Discharge Orders     None         Blane Ohara, MD 11/23/22 1455

## 2022-12-04 ENCOUNTER — Other Ambulatory Visit: Payer: Self-pay

## 2022-12-04 ENCOUNTER — Emergency Department (HOSPITAL_COMMUNITY)
Admission: EM | Admit: 2022-12-04 | Discharge: 2022-12-04 | Disposition: A | Discharge status: Home / Self Care | Payer: Medicare Other | Attending: Emergency Medicine | Admitting: Emergency Medicine

## 2022-12-04 ENCOUNTER — Encounter (HOSPITAL_COMMUNITY): Payer: Self-pay

## 2022-12-04 DIAGNOSIS — Z5900 Homelessness unspecified: Secondary | ICD-10-CM | POA: Insufficient documentation

## 2022-12-04 DIAGNOSIS — L89219 Pressure ulcer of right hip, unspecified stage: Secondary | ICD-10-CM | POA: Diagnosis not present

## 2022-12-04 DIAGNOSIS — M79604 Pain in right leg: Secondary | ICD-10-CM | POA: Diagnosis present

## 2022-12-04 MED ORDER — ACETAMINOPHEN 325 MG PO TABS
650.0000 mg | ORAL_TABLET | Freq: Once | ORAL | Status: AC
  Administered 2022-12-04: 650 mg via ORAL
  Filled 2022-12-04: qty 2

## 2022-12-04 NOTE — ED Triage Notes (Signed)
Pt arrived POV c/o bilateral leg pain x several days.

## 2022-12-04 NOTE — ED Provider Notes (Signed)
[facility] EMERGENCY DEPARTMENT Provider Note   CSN: [id number] Arrival date & time: 12/04/22  1158     History  Chief Complaint  Patient presents with   Leg Pain    Stephen Delacruz is a 66 y.o. male.   Leg Pain    Pt is homeless.  He has been sleeping on the ground for a couple of weeks.  Pt has been having pain in both legs, in the joints.  No recent falls.  He has noticed some sores on his legs where the concrete has been rubbing his leg.  No fevers.  No swelling  Home Medications Prior to Admission medications   Medication Sig Start Date End Date Taking? Authorizing Provider  amLODipine (NORVASC) 10 MG tablet Take 1 tablet (10 mg total) by mouth daily. 09/19/22 10/19/22  Princess Bruins, DO  atorvastatin (LIPITOR) 40 MG tablet Take 1 tablet (40 mg total) by mouth at bedtime. 09/19/22 10/19/22  Princess Bruins, DO  OLANZapine (ZYPREXA) 2.5 MG tablet Take 1 tablet (2.5 mg total) by mouth at bedtime. 11/20/22 12/20/22  Schutt, Edsel Petrin, PA-C  OXcarbazepine (TRILEPTAL) 150 MG tablet Take 1 tablet (150 mg total) by mouth 2 (two) times daily. 09/19/22 10/19/22  Princess Bruins, DO  sertraline (ZOLOFT) 50 MG tablet Take 1 tablet (50 mg total) by mouth daily. 09/19/22 10/19/22  Princess Bruins, DO      Allergies    Prolixin [fluphenazine]    Review of Systems   Review of Systems  Physical Exam Updated Vital Signs BP 125/82 (BP Location: Right Arm)   Pulse (!) 106   Temp 98 F (36.7 C)   Resp 18   SpO2 97%  Physical Exam Vitals and nursing note reviewed.  Constitutional:      General: He is not in acute distress.    Appearance: He is well-developed.  HENT:     Head: Normocephalic and atraumatic.     Right Ear: External ear normal.     Left Ear: External ear normal.  Eyes:     General: No scleral icterus.       Right eye: No discharge.        Left eye: No discharge.     Conjunctiva/sclera: Conjunctivae normal.  Neck:     Trachea: No tracheal  deviation.  Cardiovascular:     Rate and Rhythm: Normal rate.  Pulmonary:     Effort: Pulmonary effort is normal. No respiratory distress.     Breath sounds: No stridor.  Abdominal:     General: There is no distension.  Musculoskeletal:        General: Tenderness present. No swelling or deformity.     Cervical back: Neck supple.     Comments: No swelling lower legs, extremities are warm and well perfused, lateral left hip with mild erythema, right help with skin breakdown, dry scab right lateral hip, approx 4 by 6 cm  Skin:    General: Skin is warm and dry.     Findings: No rash.  Neurological:     Mental Status: He is alert.     Cranial Nerves: Cranial nerve deficit: no gross deficits.     ED Results / Procedures / Treatments   Labs (all labs ordered are listed, but only abnormal results are displayed) Labs Reviewed - No data to display  EKG None  Radiology No results found.  Procedures Procedures    Medications Ordered in ED Medications - No data to display  ED Course/ Medical  Decision Making/ A&P                           Medical Decision Making Problems Addressed: Pressure injury of skin of right hip, unspecified injury stage: acute illness or injury  Risk Diagnosis or treatment significantly limited by social determinants of health.   Pt is homeless.  Has been sleeping on hard ground.  Appears to have a pressure ulcer on right hip.  Early signs of redness on the left hip.  No signs of infection.  Discussed trying to sleep on padded surface.  Pt given a soft fleece blanket, resources for homeless shelter.  Food and beverage provided.  Evaluation and diagnostic testing in the emergency department does not suggest an emergent condition requiring admission or immediate intervention beyond what has been performed at this time.  The patient is safe for discharge and has been instructed to return immediately for worsening symptoms, change in symptoms or any other  concerns.         Final Clinical Impression(s) / ED Diagnoses Final diagnoses:  Pressure injury of skin of right hip, unspecified injury stage  Homeless    Rx / DC Orders ED Discharge Orders     None         Dorie Rank, MD 12/04/22 1727

## 2022-12-04 NOTE — ED Provider Triage Note (Addendum)
Emergency Medicine Provider Triage Evaluation Note  Wardell Pokorski , a 66 y.o. male  was evaluated in triage.  Pt complains of left leg pain over the last unknown amount of days.  Hx of homelessness over last year.  States he has been sleeping on the ground outside.  Denies chest pain, shortness of breath, numbness, tingling, weakness, or recent injury.  States he normally lays on his left side.  Denies fever.    Hx of 9 visits in last 2 months.  Hx of polysubstance abuse and schizoaffective disorder.  Review of Systems  Positive: See above Negative:   Physical Exam  There were no vitals taken for this visit. Gen:   Awake, no distress, disheveled Resp:  Normal effort  MSK:   Moves extremities without difficulty  Other:  Pants are wet, most at the bottom legs.  Feces in socks and on left leg.  No erythema, warmth, swelling, or organic tenderness appreciated of left leg.  Able to wiggle toes.  Lower extremities appear neurovascularly intact.  Medical Decision Making  Medically screening exam initiated at 1:19 PM.  Appropriate orders placed.  Kron Everton was informed that the remainder of the evaluation will be completed by another provider, this initial triage assessment does not replace that evaluation, and the importance of remaining in the ED until their evaluation is complete.     Cecil Cobbs, PA-C 12/04/22 1330

## 2022-12-04 NOTE — Discharge Instructions (Signed)
Try to avoid putting pressure on the hip.  Keep the area clean and dry as best as you can.  Please review the resource guide to help you find a warm safe place to sleep.

## 2022-12-13 ENCOUNTER — Inpatient Hospital Stay (HOSPITAL_COMMUNITY)
Admission: EM | Admit: 2022-12-13 | Discharge: 2022-12-16 | DRG: 641 | Disposition: A | Discharge status: Home / Self Care | Payer: Medicare Other | Attending: Internal Medicine | Admitting: Internal Medicine

## 2022-12-13 ENCOUNTER — Emergency Department (HOSPITAL_COMMUNITY): Payer: Medicare Other

## 2022-12-13 ENCOUNTER — Other Ambulatory Visit: Payer: Self-pay

## 2022-12-13 DIAGNOSIS — Z1152 Encounter for screening for COVID-19: Secondary | ICD-10-CM

## 2022-12-13 DIAGNOSIS — E860 Dehydration: Principal | ICD-10-CM

## 2022-12-13 DIAGNOSIS — F17210 Nicotine dependence, cigarettes, uncomplicated: Secondary | ICD-10-CM | POA: Diagnosis present

## 2022-12-13 DIAGNOSIS — R531 Weakness: Secondary | ICD-10-CM

## 2022-12-13 DIAGNOSIS — Z820 Family history of epilepsy and other diseases of the nervous system: Secondary | ICD-10-CM

## 2022-12-13 DIAGNOSIS — E538 Deficiency of other specified B group vitamins: Secondary | ICD-10-CM | POA: Diagnosis present

## 2022-12-13 DIAGNOSIS — Z79899 Other long term (current) drug therapy: Secondary | ICD-10-CM

## 2022-12-13 DIAGNOSIS — E559 Vitamin D deficiency, unspecified: Secondary | ICD-10-CM | POA: Diagnosis present

## 2022-12-13 DIAGNOSIS — Z8249 Family history of ischemic heart disease and other diseases of the circulatory system: Secondary | ICD-10-CM

## 2022-12-13 DIAGNOSIS — Z818 Family history of other mental and behavioral disorders: Secondary | ICD-10-CM

## 2022-12-13 DIAGNOSIS — I10 Essential (primary) hypertension: Secondary | ICD-10-CM | POA: Diagnosis present

## 2022-12-13 DIAGNOSIS — E876 Hypokalemia: Secondary | ICD-10-CM | POA: Diagnosis present

## 2022-12-13 DIAGNOSIS — E872 Acidosis, unspecified: Secondary | ICD-10-CM

## 2022-12-13 DIAGNOSIS — F4310 Post-traumatic stress disorder, unspecified: Secondary | ICD-10-CM | POA: Diagnosis present

## 2022-12-13 DIAGNOSIS — F3164 Bipolar disorder, current episode mixed, severe, with psychotic features: Secondary | ICD-10-CM | POA: Diagnosis present

## 2022-12-13 DIAGNOSIS — Z91128 Patient's intentional underdosing of medication regimen for other reason: Secondary | ICD-10-CM

## 2022-12-13 DIAGNOSIS — R197 Diarrhea, unspecified: Secondary | ICD-10-CM

## 2022-12-13 DIAGNOSIS — L89890 Pressure ulcer of other site, unstageable: Secondary | ICD-10-CM | POA: Diagnosis present

## 2022-12-13 DIAGNOSIS — Z5900 Homelessness unspecified: Secondary | ICD-10-CM

## 2022-12-13 DIAGNOSIS — Z66 Do not resuscitate: Secondary | ICD-10-CM | POA: Diagnosis present

## 2022-12-13 DIAGNOSIS — E785 Hyperlipidemia, unspecified: Secondary | ICD-10-CM | POA: Diagnosis present

## 2022-12-13 DIAGNOSIS — Z888 Allergy status to other drugs, medicaments and biological substances status: Secondary | ICD-10-CM

## 2022-12-13 DIAGNOSIS — F250 Schizoaffective disorder, bipolar type: Secondary | ICD-10-CM | POA: Diagnosis present

## 2022-12-13 DIAGNOSIS — F419 Anxiety disorder, unspecified: Secondary | ICD-10-CM | POA: Diagnosis present

## 2022-12-13 DIAGNOSIS — Z809 Family history of malignant neoplasm, unspecified: Secondary | ICD-10-CM

## 2022-12-13 LAB — COMPREHENSIVE METABOLIC PANEL
ALT: 26 U/L (ref 0–44)
AST: 29 U/L (ref 15–41)
Albumin: 3.4 g/dL — ABNORMAL LOW (ref 3.5–5.0)
Alkaline Phosphatase: 86 U/L (ref 38–126)
Anion gap: 11 (ref 5–15)
BUN: 17 mg/dL (ref 8–23)
CO2: 29 mmol/L (ref 22–32)
Calcium: 9.1 mg/dL (ref 8.9–10.3)
Chloride: 98 mmol/L (ref 98–111)
Creatinine, Ser: 0.83 mg/dL (ref 0.61–1.24)
GFR, Estimated: >60 mL/min (ref >60)
Glucose, Bld: 158 mg/dL — ABNORMAL HIGH (ref 70–99)
Potassium: 4 mmol/L (ref 3.5–5.1)
Sodium: 138 mmol/L (ref 135–145)
Total Bilirubin: 0.5 mg/dL (ref 0.3–1.2)
Total Protein: 6.8 g/dL (ref 6.5–8.1)

## 2022-12-13 LAB — URINALYSIS, ROUTINE W REFLEX MICROSCOPIC
Bilirubin Urine: NEGATIVE
Glucose, UA: NEGATIVE mg/dL
Hgb urine dipstick: NEGATIVE
Ketones, ur: NEGATIVE mg/dL
Leukocytes,Ua: NEGATIVE
Nitrite: NEGATIVE
Protein, ur: NEGATIVE mg/dL
Specific Gravity, Urine: 1.031 — ABNORMAL HIGH (ref 1.005–1.030)
pH: 6 (ref 5.0–8.0)

## 2022-12-13 LAB — CBC WITH DIFFERENTIAL/PLATELET
Abs Immature Granulocytes: 0.06 10*3/uL (ref 0.00–0.07)
Basophils Absolute: 0 10*3/uL (ref 0.0–0.1)
Basophils Relative: 0 %
Eosinophils Absolute: 0 10*3/uL (ref 0.0–0.5)
Eosinophils Relative: 0 %
HCT: 47 % (ref 39.0–52.0)
Hemoglobin: 15.8 g/dL (ref 13.0–17.0)
Immature Granulocytes: 1 %
Lymphocytes Relative: 10 %
Lymphs Abs: 1.1 10*3/uL (ref 0.7–4.0)
MCH: 31.9 pg (ref 26.0–34.0)
MCHC: 33.6 g/dL (ref 30.0–36.0)
MCV: 94.8 fL (ref 80.0–100.0)
Monocytes Absolute: 1 10*3/uL (ref 0.1–1.0)
Monocytes Relative: 10 %
Neutro Abs: 8.5 10*3/uL — ABNORMAL HIGH (ref 1.7–7.7)
Neutrophils Relative %: 79 %
Platelets: 475 10*3/uL — ABNORMAL HIGH (ref 150–400)
RBC: 4.96 MIL/uL (ref 4.22–5.81)
RDW: 13.9 % (ref 11.5–15.5)
WBC: 10.7 10*3/uL — ABNORMAL HIGH (ref 4.0–10.5)
nRBC: 0 % (ref 0.0–0.2)

## 2022-12-13 LAB — CK: Total CK: 179 U/L (ref 49–397)

## 2022-12-13 LAB — RESP PANEL BY RT-PCR (RSV, FLU A&B, COVID)  RVPGX2
Influenza A by PCR: NEGATIVE
Influenza B by PCR: NEGATIVE
Resp Syncytial Virus by PCR: NEGATIVE
SARS Coronavirus 2 by RT PCR: NEGATIVE

## 2022-12-13 LAB — CBG MONITORING, ED: Glucose-Capillary: 168 mg/dL — ABNORMAL HIGH (ref 70–99)

## 2022-12-13 LAB — RAPID URINE DRUG SCREEN, HOSP PERFORMED
Amphetamines: NOT DETECTED
Barbiturates: NOT DETECTED
Benzodiazepines: NOT DETECTED
Cocaine: NOT DETECTED
Opiates: NOT DETECTED
Tetrahydrocannabinol: NOT DETECTED

## 2022-12-13 LAB — MAGNESIUM: Magnesium: 1.9 mg/dL (ref 1.7–2.4)

## 2022-12-13 LAB — VITAMIN B12: Vitamin B-12: 158 pg/mL — ABNORMAL LOW (ref 180–914)

## 2022-12-13 LAB — ETHANOL: Alcohol, Ethyl (B): <10 mg/dL (ref <10)

## 2022-12-13 LAB — LIPASE, BLOOD: Lipase: 38 U/L (ref 11–51)

## 2022-12-13 LAB — LACTIC ACID, PLASMA
Lactic Acid, Venous: 3.9 mmol/L (ref 0.5–1.9)
Lactic Acid, Venous: 2.4 mmol/L (ref 0.5–1.9)
Lactic Acid, Venous: 2.3 mmol/L (ref 0.5–1.9)

## 2022-12-13 LAB — TSH: TSH: 2.081 u[IU]/mL (ref 0.350–4.500)

## 2022-12-13 MED ORDER — TRAZODONE HCL 50 MG PO TABS: 50.0000 mg | ORAL_TABLET | Freq: Every evening | ORAL | Status: DC | PRN

## 2022-12-13 MED ORDER — IOHEXOL 300 MG/ML  SOLN
100.0000 mL | Freq: Once | INTRAMUSCULAR | Status: AC | PRN
  Administered 2022-12-13: 100 mL via INTRAVENOUS

## 2022-12-13 MED ORDER — OXYCODONE HCL 5 MG PO TABS
5.0000 mg | ORAL_TABLET | ORAL | Status: DC | PRN
  Administered 2022-12-13 – 2022-12-16 (×2): 5 mg via ORAL
  Filled 2022-12-13 (×2): qty 1

## 2022-12-13 MED ORDER — GUAIFENESIN 100 MG/5ML PO LIQD: 5.0000 mL | ORAL | Status: DC | PRN

## 2022-12-13 MED ORDER — SODIUM CHLORIDE 0.9 % IV SOLN: INTRAVENOUS | Status: AC

## 2022-12-13 MED ORDER — HYDRALAZINE HCL 20 MG/ML IJ SOLN: 10.0000 mg | INTRAMUSCULAR | Status: DC | PRN

## 2022-12-13 MED ORDER — ENOXAPARIN SODIUM 40 MG/0.4ML IJ SOSY
40.0000 mg | PREFILLED_SYRINGE | INTRAMUSCULAR | Status: DC
  Administered 2022-12-13 – 2022-12-15 (×3): 40 mg via SUBCUTANEOUS
  Filled 2022-12-13 (×3): qty 0.4

## 2022-12-13 MED ORDER — SENNOSIDES-DOCUSATE SODIUM 8.6-50 MG PO TABS: 1.0000 | ORAL_TABLET | Freq: Every evening | ORAL | Status: DC | PRN

## 2022-12-13 MED ORDER — IPRATROPIUM-ALBUTEROL 0.5-2.5 (3) MG/3ML IN SOLN: 3.0000 mL | RESPIRATORY_TRACT | Status: DC | PRN

## 2022-12-13 MED ORDER — SODIUM CHLORIDE 0.9 % IV BOLUS
1000.0000 mL | Freq: Once | INTRAVENOUS | Status: AC
  Administered 2022-12-13: 1000 mL via INTRAVENOUS

## 2022-12-13 MED ORDER — ONDANSETRON HCL 4 MG/2ML IJ SOLN: 4.0000 mg | Freq: Four times a day (QID) | INTRAMUSCULAR | Status: DC | PRN

## 2022-12-13 MED ORDER — METOPROLOL TARTRATE 5 MG/5ML IV SOLN
5.0000 mg | INTRAVENOUS | Status: DC | PRN
  Administered 2022-12-13: 5 mg via INTRAVENOUS
  Filled 2022-12-13: qty 5

## 2022-12-13 NOTE — ED Triage Notes (Signed)
Pt BIB [facility] from [facility] for generalized weakness, diarrhea x 2 months, Bipolar depressive episode. 124/78 112 HR 16 RR 97 % r/a

## 2022-12-13 NOTE — ED Provider Notes (Signed)
[facility]-EMERGENCY DEPT Provider Note   CSN: [id number] Arrival date & time: 12/13/22  1459     History  Chief Complaint  Patient presents with   Fatigue   Diarrhea   Depression    Stephen Delacruz is a 66 y.o. male.  Pt is a 66 yo with a pmhx significant for PTSD, anxiety, bipolar 1, hypertension, polysubstance abuse, and homelessness.  Pt said he's been living outside and has been very weak.  He's had diarrhea.  He has not been eating.  He said he is too nauseous to eat.  He was absolutely covered in stool upon arrival.          Home Medications Prior to Admission medications   Medication Sig Start Date End Date Taking? Authorizing Provider  amLODipine (NORVASC) 10 MG tablet Take 1 tablet (10 mg total) by mouth daily. 09/19/22 12/13/22  Princess Bruins, DO  atorvastatin (LIPITOR) 40 MG tablet Take 1 tablet (40 mg total) by mouth at bedtime. 09/19/22 12/13/22  Princess Bruins, DO  OLANZapine (ZYPREXA) 2.5 MG tablet Take 1 tablet (2.5 mg total) by mouth at bedtime. 11/20/22 12/20/22  Schutt, Edsel Petrin, PA-C  OXcarbazepine (TRILEPTAL) 150 MG tablet Take 1 tablet (150 mg total) by mouth 2 (two) times daily. 09/19/22 12/13/22  Princess Bruins, DO  sertraline (ZOLOFT) 50 MG tablet Take 1 tablet (50 mg total) by mouth daily. 09/19/22 12/13/22  Princess Bruins, DO      Allergies    Prolixin [fluphenazine]    Review of Systems   Review of Systems  Gastrointestinal:  Positive for diarrhea and nausea.  Neurological:  Positive for weakness.  All other systems reviewed and are negative.   Physical Exam Updated Vital Signs BP (!) 143/89   Pulse (!) 103   Temp 98.5 F (36.9 C) (Rectal)   Resp 13   SpO2 96%  Physical Exam Vitals and nursing note reviewed.  Constitutional:      Appearance: Normal appearance. He is ill-appearing.  HENT:     Head: Normocephalic and atraumatic.     Right Ear: External ear normal.     Left Ear: External ear normal.     Nose: Nose  normal.     Mouth/Throat:     Mouth: Mucous membranes are dry.  Eyes:     Extraocular Movements: Extraocular movements intact.     Conjunctiva/sclera: Conjunctivae normal.     Pupils: Pupils are equal, round, and reactive to light.  Cardiovascular:     Rate and Rhythm: Regular rhythm. Tachycardia present.     Pulses: Normal pulses.     Heart sounds: Normal heart sounds.  Pulmonary:     Effort: Pulmonary effort is normal.     Breath sounds: Normal breath sounds.  Abdominal:     General: Abdomen is flat. Bowel sounds are normal.     Palpations: Abdomen is soft.  Musculoskeletal:        General: Normal range of motion.     Cervical back: Normal range of motion and neck supple.  Skin:    Capillary Refill: Capillary refill takes less than 2 seconds.     Comments: Skin is excoriated all over legs, back, scrotum; sore to right hip.  Hands and feet are cold, but core temp is nl.  Neurological:     General: No focal deficit present.     Mental Status: He is alert and oriented to person, place, and time.  Psychiatric:        Mood  and Affect: Mood normal.     ED Results / Procedures / Treatments   Labs (all labs ordered are listed, but only abnormal results are displayed) Labs Reviewed  CBC WITH DIFFERENTIAL/PLATELET - Abnormal; Notable for the following components:      Result Value   WBC 10.7 (*)    Platelets 475 (*)    Neutro Abs 8.5 (*)    All other components within normal limits  COMPREHENSIVE METABOLIC PANEL - Abnormal; Notable for the following components:   Glucose, Bld 158 (*)    Albumin 3.4 (*)    All other components within normal limits  LACTIC ACID, PLASMA - Abnormal; Notable for the following components:   Lactic Acid, Venous 3.9 (*)    All other components within normal limits  CBG MONITORING, ED - Abnormal; Notable for the following components:   Glucose-Capillary 168 (*)    All other components within normal limits  RESP PANEL BY RT-PCR (RSV, FLU A&B,  COVID)  RVPGX2  GASTROINTESTINAL PANEL BY PCR, STOOL (REPLACES STOOL CULTURE)  C DIFFICILE QUICK SCREEN W PCR REFLEX    LIPASE, BLOOD  MAGNESIUM  CK  ETHANOL  URINALYSIS, ROUTINE W REFLEX MICROSCOPIC  LACTIC ACID, PLASMA  RAPID URINE DRUG SCREEN, HOSP PERFORMED  CBC  COMPREHENSIVE METABOLIC PANEL  MAGNESIUM  HIV ANTIBODY (ROUTINE TESTING W REFLEX)  CBC  CREATININE, SERUM  LACTIC ACID, PLASMA  LACTIC ACID, PLASMA    EKG EKG Interpretation  Date/Time:  Wednesday December 13 2022 15:44:04 EST Ventricular Rate:  119 PR Interval:    QRS Duration: 82 QT Interval:  319 QTC Calculation: 449 R Axis:   186 Text Interpretation: Sinus tachycardia Right axis deviation Nonspecific T abnormalities, lateral leads ST elevation, consider inferior injury Artifact in lead(s) I II V1 duplicate Confirmed by Jacalyn LefevreHaviland, Lella Mullany [phone]([id number]) on 12/13/2022 4:07:44 PM  Radiology CT ABDOMEN PELVIS W CONTRAST  Result Date: 12/13/2022 CLINICAL DATA:  Diarrhea for 2 months EXAM: CT ABDOMEN AND PELVIS WITH CONTRAST TECHNIQUE: Multidetector CT imaging of the abdomen and pelvis was performed using the standard protocol following bolus administration of intravenous contrast. RADIATION DOSE REDUCTION: This exam was performed according to the departmental dose-optimization program which includes automated exposure control, adjustment of the mA and/or kV according to patient size and/or use of iterative reconstruction technique. CONTRAST:  100mL OMNIPAQUE IOHEXOL 300 MG/ML  SOLN COMPARISON:  CT chest, abdomen and pelvis dated April 10, 2021 FINDINGS: Lower chest: No acute abnormality. Hepatobiliary: No focal liver abnormality is seen. Gallstones with no evidence of gallbladder wall thickening. No biliary ductal dilation. Pancreas: Unremarkable. No pancreatic ductal dilatation or surrounding inflammatory changes. Spleen: Normal in size without focal abnormality. Adrenals/Urinary Tract: Bilateral adrenal glands are  unremarkable. No hydronephrosis or nephrolithiasis. Bilateral low-attenuation renal lesions, largest are compatible with simple cysts, others are too small to completely characterize. Hyperdense lesion of the posterior bladder wall which is likely due to small layering bladder stones, seen on series 2, image 76. Stomach/Bowel: Stomach is within normal limits. Appendix appears normal. Diverticulosis. No evidence of bowel wall thickening, distention, or inflammatory changes. Vascular/Lymphatic: Aortic atherosclerosis. No enlarged abdominal or pelvic lymph nodes. Reproductive: Prostatomegaly. Other: No abdominal wall hernia or abnormality. No abdominopelvic ascites. Musculoskeletal: No acute or significant osseous findings. IMPRESSION: 1. No acute findings in the abdomen or pelvis. 2. Hyperattenuating of the posterior bladder wall which is likely due to small layering bladder stones, although small neoplasm can not be excluded. Recommend cystoscopy for further evaluation. 3. Cholelithiasis with no  evidence of acute cholecystitis. 4. Prostatomegaly. 5. Aortic Atherosclerosis (ICD10-I70.0). Electronically Signed   By: Allegra Lai M.D.   On: 12/13/2022 17:11   DG Chest Portable 1 View  Result Date: 12/13/2022 CLINICAL DATA:  Weakness EXAM: PORTABLE CHEST 1 VIEW COMPARISON:  06/16/2021 FINDINGS: The heart size and mediastinal contours are within normal limits. Both lungs are clear. The visualized skeletal structures are unremarkable. IMPRESSION: No active disease. Electronically Signed   By: Jasmine Pang M.D.   On: 12/13/2022 16:02    Procedures Procedures    Medications Ordered in ED Medications  ipratropium-albuterol (DUONEB) 0.5-2.5 (3) MG/3ML nebulizer solution 3 mL (has no administration in time range)  hydrALAZINE (APRESOLINE) injection 10 mg (has no administration in time range)  metoprolol tartrate (LOPRESSOR) injection 5 mg (has no administration in time range)  oxyCODONE (Oxy IR/ROXICODONE)  immediate release tablet 5 mg (has no administration in time range)  senna-docusate (Senokot-S) tablet 1 tablet (has no administration in time range)  guaiFENesin (ROBITUSSIN) 100 MG/5ML liquid 5 mL (has no administration in time range)  traZODone (DESYREL) tablet 50 mg (has no administration in time range)  ondansetron (ZOFRAN) injection 4 mg (has no administration in time range)  0.9 %  sodium chloride infusion (has no administration in time range)  enoxaparin (LOVENOX) injection 40 mg (has no administration in time range)  sodium chloride 0.9 % bolus 1,000 mL (0 mLs Intravenous Stopped 12/13/22 1720)  iohexol (OMNIPAQUE) 300 MG/ML solution 100 mL (100 mLs Intravenous Contrast Given 12/13/22 1653)  sodium chloride 0.9 % bolus 1,000 mL (1,000 mLs Intravenous New Bag/Given 12/13/22 1712)    ED Course/ Medical Decision Making/ A&P                           Medical Decision Making Amount and/or Complexity of Data Reviewed Labs: ordered. Radiology: ordered.  Risk Prescription drug management. Decision regarding hospitalization.   This patient presents to the ED for concern of weakness/diarrhea, this involves an extensive number of treatment options, and is a complaint that carries with it a high risk of complications and morbidity.  The differential diagnosis includes infection, electrolyte abn, covdi/flu   Co morbidities that complicate the patient evaluation  PTSD, anxiety, bipolar 1, hypertension, homeless   Additional history obtained:  Additional history obtained from epic chart review External records from outside source obtained and reviewed including EMS report   Lab Tests:  I Ordered, and personally interpreted labs.  The pertinent results include:  cbc nl, cmp nl, lip nl, mg nl   Imaging Studies ordered:  I ordered imaging studies including cxr and CT abd/pelvis I independently visualized and interpreted imaging which showed CXR:  No active disease.  CT  abd/pelvis: 1. No acute findings in the abdomen or pelvis.  2. Hyperattenuating of the posterior bladder wall which is likely  due to small layering bladder stones, although small neoplasm can  not be excluded. Recommend cystoscopy for further evaluation.  3. Cholelithiasis with no evidence of acute cholecystitis.  4. Prostatomegaly.  5. Aortic Atherosclerosis (ICD10-I70.0).   I agree with the radiologist interpretation   Cardiac Monitoring:  The patient was maintained on a cardiac monitor.  I personally viewed and interpreted the cardiac monitored which showed an underlying rhythm of: sinus tachy   Medicines ordered and prescription drug management:  I ordered medication including IVFs  for dehydration  Reevaluation of the patient after these medicines showed that the patient improved I have reviewed  the patients home medicines and have made adjustments as needed   Test Considered:  ct   Critical Interventions:  ivfs   Consultations Obtained:  I requested consultation with the hospitalist (Dr Nelson Chimes). ,  and discussed lab and imaging findings as well as pertinent plan - he will admit   Problem List / ED Course:  Diarrhea:  stool studies ordered.  Pt given IVFs.  CT neg for colitis.  Dehydration:  pt does have a lactic acidosis which is likely from dehydration. Homeless status:  needs TOC    Reevaluation:  After the interventions noted above, I reevaluated the patient and found that they have :improved   Social Determinants of Health:  homeless   Dispostion:  After consideration of the diagnostic results and the patients response to treatment, I feel that the patent would benefit from admission.          Final Clinical Impression(s) / ED Diagnoses Final diagnoses:  Dehydration  Diarrhea, unspecified type  Weakness  Lactic acidosis  Homeless    Rx / DC Orders ED Discharge Orders     None         Jacalyn Lefevre, MD 12/13/22 1801
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
## 2022-12-13 NOTE — ED Notes (Signed)
Pt provided with a dinner tray, ate about 25%

## 2022-12-13 NOTE — ED Notes (Signed)
Patient transported to CT 

## 2022-12-13 NOTE — ED Notes (Signed)
Stephen Delacruz, Counselor with [facility] response team arrived with pt. He is aware of pt situation and available for any questions, [phone]  Adult [address] is Stephen Delacruz [phone]  Pt medical POA is his brother, Stephen Delacruz, [phone]

## 2022-12-14 ENCOUNTER — Encounter (HOSPITAL_COMMUNITY): Payer: Self-pay | Admitting: Internal Medicine

## 2022-12-14 DIAGNOSIS — E559 Vitamin D deficiency, unspecified: Secondary | ICD-10-CM | POA: Diagnosis present

## 2022-12-14 DIAGNOSIS — R531 Weakness: Secondary | ICD-10-CM | POA: Diagnosis not present

## 2022-12-14 DIAGNOSIS — Z820 Family history of epilepsy and other diseases of the nervous system: Secondary | ICD-10-CM | POA: Diagnosis not present

## 2022-12-14 DIAGNOSIS — Z79899 Other long term (current) drug therapy: Secondary | ICD-10-CM | POA: Diagnosis not present

## 2022-12-14 DIAGNOSIS — Z818 Family history of other mental and behavioral disorders: Secondary | ICD-10-CM | POA: Diagnosis not present

## 2022-12-14 DIAGNOSIS — Z809 Family history of malignant neoplasm, unspecified: Secondary | ICD-10-CM | POA: Diagnosis not present

## 2022-12-14 DIAGNOSIS — E860 Dehydration: Secondary | ICD-10-CM | POA: Diagnosis present

## 2022-12-14 DIAGNOSIS — Z5900 Homelessness unspecified: Secondary | ICD-10-CM | POA: Diagnosis not present

## 2022-12-14 DIAGNOSIS — Z91128 Patient's intentional underdosing of medication regimen for other reason: Secondary | ICD-10-CM | POA: Diagnosis not present

## 2022-12-14 DIAGNOSIS — Z888 Allergy status to other drugs, medicaments and biological substances status: Secondary | ICD-10-CM | POA: Diagnosis not present

## 2022-12-14 DIAGNOSIS — R197 Diarrhea, unspecified: Secondary | ICD-10-CM | POA: Diagnosis present

## 2022-12-14 DIAGNOSIS — E785 Hyperlipidemia, unspecified: Secondary | ICD-10-CM | POA: Diagnosis present

## 2022-12-14 DIAGNOSIS — Z66 Do not resuscitate: Secondary | ICD-10-CM | POA: Diagnosis present

## 2022-12-14 DIAGNOSIS — Z8249 Family history of ischemic heart disease and other diseases of the circulatory system: Secondary | ICD-10-CM | POA: Diagnosis not present

## 2022-12-14 DIAGNOSIS — E876 Hypokalemia: Secondary | ICD-10-CM | POA: Diagnosis present

## 2022-12-14 DIAGNOSIS — F250 Schizoaffective disorder, bipolar type: Secondary | ICD-10-CM | POA: Diagnosis present

## 2022-12-14 DIAGNOSIS — E872 Acidosis, unspecified: Secondary | ICD-10-CM | POA: Diagnosis present

## 2022-12-14 DIAGNOSIS — E538 Deficiency of other specified B group vitamins: Secondary | ICD-10-CM | POA: Diagnosis present

## 2022-12-14 DIAGNOSIS — I10 Essential (primary) hypertension: Secondary | ICD-10-CM | POA: Diagnosis present

## 2022-12-14 DIAGNOSIS — F17210 Nicotine dependence, cigarettes, uncomplicated: Secondary | ICD-10-CM | POA: Diagnosis present

## 2022-12-14 DIAGNOSIS — L89890 Pressure ulcer of other site, unstageable: Secondary | ICD-10-CM | POA: Diagnosis present

## 2022-12-14 DIAGNOSIS — Z1152 Encounter for screening for COVID-19: Secondary | ICD-10-CM | POA: Diagnosis not present

## 2022-12-14 DIAGNOSIS — F4310 Post-traumatic stress disorder, unspecified: Secondary | ICD-10-CM | POA: Diagnosis present

## 2022-12-14 DIAGNOSIS — F419 Anxiety disorder, unspecified: Secondary | ICD-10-CM | POA: Diagnosis present

## 2022-12-14 LAB — HIV ANTIBODY (ROUTINE TESTING W REFLEX): HIV Screen 4th Generation wRfx: NONREACTIVE

## 2022-12-14 LAB — COMPREHENSIVE METABOLIC PANEL
ALT: 19 U/L (ref 0–44)
AST: 23 U/L (ref 15–41)
Albumin: 2.9 g/dL — ABNORMAL LOW (ref 3.5–5.0)
Alkaline Phosphatase: 62 U/L (ref 38–126)
Anion gap: 7 (ref 5–15)
BUN: 19 mg/dL (ref 8–23)
CO2: 24 mmol/L (ref 22–32)
Calcium: 7.9 mg/dL — ABNORMAL LOW (ref 8.9–10.3)
Chloride: 106 mmol/L (ref 98–111)
Creatinine, Ser: 1.14 mg/dL (ref 0.61–1.24)
GFR, Estimated: >60 mL/min (ref >60)
Glucose, Bld: 117 mg/dL — ABNORMAL HIGH (ref 70–99)
Potassium: 3.6 mmol/L (ref 3.5–5.1)
Sodium: 137 mmol/L (ref 135–145)
Total Bilirubin: 0.4 mg/dL (ref 0.3–1.2)
Total Protein: 5.3 g/dL — ABNORMAL LOW (ref 6.5–8.1)

## 2022-12-14 LAB — CBC
HCT: 37.3 % — ABNORMAL LOW (ref 39.0–52.0)
Hemoglobin: 12.5 g/dL — ABNORMAL LOW (ref 13.0–17.0)
MCH: 31.8 pg (ref 26.0–34.0)
MCHC: 33.5 g/dL (ref 30.0–36.0)
MCV: 94.9 fL (ref 80.0–100.0)
Platelets: 409 10*3/uL — ABNORMAL HIGH (ref 150–400)
RBC: 3.93 MIL/uL — ABNORMAL LOW (ref 4.22–5.81)
RDW: 13.8 % (ref 11.5–15.5)
WBC: 8.9 10*3/uL (ref 4.0–10.5)
nRBC: 0 % (ref 0.0–0.2)

## 2022-12-14 LAB — FOLATE: Folate: 7 ng/mL (ref >5.9)

## 2022-12-14 LAB — VITAMIN D 25 HYDROXY (VIT D DEFICIENCY, FRACTURES): Vit D, 25-Hydroxy: 26.93 ng/mL — ABNORMAL LOW (ref 30–100)

## 2022-12-14 LAB — LACTIC ACID, PLASMA: Lactic Acid, Venous: 1.9 mmol/L (ref 0.5–1.9)

## 2022-12-14 LAB — MAGNESIUM: Magnesium: 1.6 mg/dL — ABNORMAL LOW (ref 1.7–2.4)

## 2022-12-14 LAB — VITAMIN B12: Vitamin B-12: 154 pg/mL — ABNORMAL LOW (ref 180–914)

## 2022-12-14 MED ORDER — VITAMIN B-12 100 MCG PO TABS
100.0000 ug | ORAL_TABLET | Freq: Every day | ORAL | Status: DC
  Administered 2022-12-15 – 2022-12-16 (×2): 100 ug via ORAL
  Filled 2022-12-14 (×2): qty 1

## 2022-12-14 MED ORDER — CHOLECALCIFEROL 10 MCG (400 UNIT) PO TABS
400.0000 [IU] | ORAL_TABLET | Freq: Every day | ORAL | Status: DC
  Administered 2022-12-14 – 2022-12-15 (×2): 400 [IU] via ORAL
  Filled 2022-12-14 (×2): qty 1

## 2022-12-14 MED ORDER — MAGNESIUM OXIDE -MG SUPPLEMENT 400 (240 MG) MG PO TABS
800.0000 mg | ORAL_TABLET | Freq: Once | ORAL | Status: AC
  Administered 2022-12-14: 800 mg via ORAL
  Filled 2022-12-14: qty 2

## 2022-12-14 MED ORDER — POTASSIUM CHLORIDE CRYS ER 20 MEQ PO TBCR
40.0000 meq | EXTENDED_RELEASE_TABLET | Freq: Once | ORAL | Status: AC
  Administered 2022-12-14: 40 meq via ORAL
  Filled 2022-12-14: qty 2

## 2022-12-14 NOTE — Evaluation (Signed)
Occupational Therapy Evaluation Patient Details Name: Stephen Delacruz MRN: [id number] DOB: 15-Apr-1956 Today's Date: 12/14/2022   History of Present Illness 66 yo male admitted with weakness, diarrhea, dehydration. Hx of bipolar disorder, back pain, PTSD, polysubstance abuse   Clinical Impression   Patient performed lower body dressing seated EOB, sit to stand, and ambulating in his room with and without a RW with stand-by assist. He reported feelings of mild generalized weakness and chronic pain with partial numbness of his feet. He was further noted to be with slight unsteadiness in dynamic standing. He appears near to his baseline level of functioning for ADLs, however OT will follow him for further services during his acute stay, to maximize his independence with self-care tasks & to facilitate his safe hospital discharge.      Recommendations for follow up therapy are one component of a multi-disciplinary discharge planning process, led by the attending physician.  Recommendations may be updated based on patient status, additional functional criteria and insurance authorization.   Follow Up Recommendations  No OT follow up     Assistance Recommended at Discharge None     Functional Status Assessment  Patient has had a recent decline in their functional status and demonstrates the ability to make significant improvements in function in a reasonable and predictable amount of time.  Equipment Recommendations  None recommended by OT       Precautions / Restrictions Precautions Precautions: Fall Restrictions Weight Bearing Restrictions: No      Mobility Bed Mobility Overal bed mobility: Modified Independent    Transfers Overall transfer level: Needs assistance Equipment used: Rolling walker (2 wheels) Transfers: Sit to/from Stand Sit to Stand: Supervision          Balance     Sitting balance-Leahy Scale: Good Sitting balance - Comments: SBA           ADL either  performed or assessed with clinical judgement   ADL Overall ADL's : Needs assistance/impaired Eating/Feeding: Independent Eating/Feeding Details (indicate cue type and reason): based on clinical judgement   Grooming Details (indicate cue type and reason): SBA standing at sink level, based on clinical judgement         Upper Body Dressing : Set up;Sitting   Lower Body Dressing: Supervision/safety Lower Body Dressing Details (indicate cue type and reason): he doffed then donned his sock seated EOB               Vision Patient Visual Report: No change from baseline              Pertinent Vitals/Pain Pain Assessment Pain Assessment: 0-10 Pain Score: 8  Pain Location: legs Pain Intervention(s): Other (comment) (He reported receiving pain medication)     Hand Dominance Right   Extremity/Trunk Assessment Upper Extremity Assessment Upper Extremity Assessment: Overall WFL for tasks assessed   Lower Extremity Assessment Lower Extremity Assessment: Overall WFL for tasks assessed      Communication Communication Communication: No difficulties   Cognition Arousal/Alertness: Awake/alert Behavior During Therapy: WFL for tasks assessed/performed, Flat affect Overall Cognitive Status: Within Functional Limits for tasks assessed        General Comments: Oriented to person, place, month, year, and situation                Home Living Family/patient expects to be discharged to:: Unsure     Type of Home: Homeless    Home Equipment: None          Prior Functioning/Environment Prior Level of  Function : Independent/Modified Independent             Mobility Comments: He reported being independent with ambulation. ADLs Comments: Patient reported being independent with ADLs.        OT Problem List: Impaired balance (sitting and/or standing);Pain;Impaired sensation      OT Treatment/Interventions: Self-care/ADL training;Therapeutic exercise;Therapeutic  activities;Energy conservation;Patient/family education;DME and/or AE instruction;Balance training    OT Goals(Current goals can be found in the care plan section) Acute Rehab OT Goals Patient Stated Goal: not specifically stated OT Goal Formulation: With patient Time For Goal Achievement: 12/28/22 Potential to Achieve Goals: Good ADL Goals Pt Will Perform Grooming: with modified independence;standing Pt Will Perform Lower Body Dressing: Independently;sit to/from stand Pt Will Transfer to Toilet: with modified independence;ambulating Pt Will Perform Toileting - Clothing Manipulation and hygiene: with modified independence;sit to/from stand Pt/caregiver will Perform Home Exercise Program: Increased strength;Both right and left upper extremity;With theraband;Independently  OT Frequency: Min 1X/week       AM-PAC OT "6 Clicks" Daily Activity     Outcome Measure Help from another person eating meals?: None Help from another person taking care of personal grooming?: None Help from another person toileting, which includes using toliet, bedpan, or urinal?: A Little Help from another person bathing (including washing, rinsing, drying)?: A Little Help from another person to put on and taking off regular upper body clothing?: None Help from another person to put on and taking off regular lower body clothing?: None 6 Click Score: 22   End of Session Nurse Communication: Mobility status  Activity Tolerance: Patient tolerated treatment well Patient left: in bed;with call bell/phone within reach;with bed alarm set  OT Visit Diagnosis: Unsteadiness on feet (R26.81);Pain                Time: 1205-1220 OT Time Calculation (min): 15 min Charges:  OT General Charges $OT Visit: 1 Visit OT Evaluation $OT Eval Low Complexity: 1 Low   Devyne Hauger L Bryton Romagnoli, OTR/L 12/14/2022, 12:39 PM

## 2022-12-14 NOTE — TOC Initial Note (Signed)
Transition of Care (TOC) - Initial/Assessment Note   Patient Details  Name: Stephen Delacruz MRN: [id number] Date of Birth: 10/09/1956  Transition of Care (TOC) CM/SW Contact:     S , LCSW Phone Number: 12/14/2022, 3:50 PM  Clinical Narrative: TOC consulted for homelessness. Assessment indicated several SDOH areas also needed to be addressed. CSW met with patient to discuss consult. Patient is agreeable to SDOH resources being added to AVS (transportation, food/social services, shelter resources). Patient reported he gets approximately $1500/month in disability, but lost his debit card and is waiting for a replacement to be delivered to his sister's home. Patient reported he will be able to use it for food once he receives the card, but had been getting food [facility] lately. Patient reported he plans to [facility] to the [facility]. CSW added resources for transportation, food, and homeless shelters to the AVS, but resources for utilities were not added as patient does not have any utilities at this time.  Planned Disposition: Homeless/Shelter Barriers to Discharge: Continued Medical Work up  Patient Goals and CMS Choice Patient states their goals for this hospitalization and ongoing recovery are:: Get new debit card for disability Choice offered to / list presented to : NA  Expected Discharge Plan and Services Planned Disposition: Homeless/Shelter In-house Referral: Clinical Social Work Post Acute Care Choice: NA Living arrangements for the past 2 months: Homeless          DME Arranged: N/A DME Agency: NA  Prior Living Arrangements/Services Living arrangements for the past 2 months: Homeless Patient language and need for interpreter reviewed:: Yes Need for Family Participation in Patient Care: No (Comment) Care giver support system in place?: Yes (comment) Criminal Activity/Legal Involvement Pertinent to Current Situation/Hospitalization: No - Comment as needed  Activities  of Daily Living Home Assistive Devices/Equipment: None ADL Screening (condition at time of admission) Patient's cognitive ability adequate to safely complete daily activities?: Yes Is the patient deaf or have difficulty hearing?: No Does the patient have difficulty seeing, even when wearing glasses/contacts?: No Does the patient have difficulty concentrating, remembering, or making decisions?: No Patient able to express need for assistance with ADLs?: Yes Does the patient have difficulty dressing or bathing?: No Independently performs ADLs?: Yes (appropriate for developmental age) Does the patient have difficulty walking or climbing stairs?: Yes Weakness of Legs: Both Weakness of Arms/Hands: None  Emotional Assessment Appearance:: Appears stated age Attitude/Demeanor/Rapport: Engaged Affect (typically observed): Appropriate Orientation: : Oriented to Self, Oriented to Place, Oriented to  Time, Oriented to Situation  Admission diagnosis:  Dehydration [E86.0] Lactic acidosis [E87.20] Weakness [R53.1] Homeless [Z59.00] Diarrhea, unspecified type [R19.7] Patient Active Problem List   Diagnosis Date Noted   Generalized weakness 12/13/2022   Dehydration 12/13/2022   Lactic acid acidosis 12/13/2022   Homelessness 10/10/2022   MDD (major depressive disorder), recurrent episode (HCC) 09/13/2022   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/2021   Aggressive behavior    Bipolar 1 disorder, depressed (HCC) 02/21/2021   Essential hypertension    HTN (hypertension) 04/30/2015   Bipolar disorder, curr episode mixed, severe, with psychotic features (HCC) 04/22/2015   Alcohol use disorder, moderate, dependence (HCC) 04/22/2015   Cannabis use disorder, moderate, dependence (HCC) 04/22/2015   Cocaine use disorder, severe, dependence (HCC) 04/22/2015   Schizoaffective disorder, bipolar type (HCC) 04/21/2015   Suicidal ideations 04/21/2015   PCP:  System, Provider Not  In Pharmacy:   [facility] [address] [place] [address]   Phone: [phone] Fax: [phone]  Social Determinants of Health (SDOH) Social History: SDOH Screenings   Food Insecurity: Food Insecurity Present (12/13/2022)  Housing: High Risk (12/13/2022)  Transportation Needs: Unmet Transportation Needs (12/13/2022)  Utilities: At Risk (12/13/2022)  Alcohol Screen: Medium Risk (02/21/2021)  Depression (PHQ2-9): Medium Risk (09/15/2022)  Tobacco Use: Medium Risk (12/14/2022)   SDOH Interventions: Food Insecurity Interventions: Other (Comment) (Social services resources added to AVS) Housing Interventions: Other (Comment) (Shelter resources added to AVS) Transportation Interventions: Other (Comment) (Transportation resources added to AVS) Utilities Interventions: Intervention Not Indicated (Patient is homeless and does not have any utilities at this time.)  Readmission Risk Interventions     No data to display         

## 2022-12-14 NOTE — Progress Notes (Signed)
PROGRESS NOTE    Stephen Delacruz  RSW:[id number] DOB: 1956-05-08 DOA: 12/13/2022 PCP: System, Provider Not In   Brief Narrative:  66 y.o. male with medical history significant of bipolar/schizophrenia, HTN, hyperlipidemia, PTSD was brought [facility] the hospital from [facility] for generalized weakness.  Patient states for the past 2 weeks he has had loose watery diarrhea along with poor oral intake and during this time he has become progressively weaker where is unable to take care of himself.  Patient has been cycling through severe phases of mania and depression.  Currently his ongoing depression phase has been for last 2 months and has stopped taking all of his medication and not taking care of himself.  CT of the abdomen pelvis and all the workup has been unremarkable here besides dehydration and lactic acidosis.  Psychiatry team consulted.   Assessment & Plan:  Principal Problem:   Generalized weakness Active Problems:   Schizoaffective disorder, bipolar type (HCC)   Bipolar disorder, curr episode mixed, severe, with psychotic features (HCC)   HTN (hypertension)   Homelessness   Dehydration   Lactic acid acidosis     Generalized weakness Dehydration with lactic acidosis -No obvious evidence of infection noted.  COVID, flu was negative.  Lactic acidosis has not resolved with IV fluids.  UA, UDS negative.  TSH and folate is normal.   Vitamin B12 deficiency Vitamin D deficiency -Repletion ordered   Diarrhea, improved - Nonbloody.  Unclear etiology.  CT of the abdomen pelvis is negative for acute pathology.  GI panel and C. difficile has been ordered but diarrhea seems to have improved  Hypokalemia/hypomagnesemia - Repletion   History of schizoaffective disorder Bipolar disorder - Has not been taking any of his medications.  Due to his severe history and medication noncompliance, psychiatry team has been consulted.  Counselor. Blair Heys and his cell is [phone].    Essential  hypertension - Not taking any home medication.  IV as needed ordered  Patient is currently medically cleared but he still needs to be evaluated by psychiatry thereafter TOC to assist with safe disposition.  DVT prophylaxis: Lovenox Code Status: DNR Family Communication:    Overall patient is doing better.  Currently awaiting psychiatry evaluation and will need safe disposition planning     Body mass index is 23.52 kg/m.  Pressure Injury 12/13/22 Ischial tuberosity Right Unstageable - Full thickness tissue loss in which the base of the injury is covered by slough (yellow, tan, gray, green or brown) and/or eschar (tan, brown or black) in the wound bed. (Active)  12/13/22 1610  Location: Ischial tuberosity  Location Orientation: Right  Staging: Unstageable - Full thickness tissue loss in which the base of the injury is covered by slough (yellow, tan, gray, green or brown) and/or eschar (tan, brown or black) in the wound bed.  Wound Description (Comments):   Present on Admission: Yes        Subjective: I had extensive discussion yesterday evening with patient's counselor Duanne Guess who informed me patient has had severe phases of mania in the past with suicidal and homicidal intentions along with money spending sprees.  But currently he is going through severe depression where he is unable to take care of himself and remains medically noncompliant.  Patient seen and examined at bedside.  He is alert awake oriented does not have any complaints.  He is tolerating p.o. without any issues   Examination:  Constitutional: Not in acute distress Respiratory: Clear to auscultation bilaterally Cardiovascular: Normal sinus rhythm,  no rubs Abdomen: Nontender nondistended good bowel sounds Musculoskeletal: No edema noted Skin: No rashes seen Neurologic: CN 2-12 grossly intact.  And nonfocal Psychiatric: Patient is alert awake oriented X2.  Very flat affect  Objective: Vitals:   12/13/22  2338 12/14/22 0300 12/14/22 0500 12/14/22 0646  BP:  138/75  136/78  Pulse:  (!) 110  (!) 102  Resp:  (!) 24  20  Temp:  98.7 F (37.1 C)  99 F (37.2 C)  TempSrc:  Oral  Oral  SpO2:  98%  98%  Weight: 65.6 kg  65.6 kg 66.1 kg  Height: 5\' 6"  (1.676 m)       Intake/Output Summary (Last 24 hours) at 12/14/2022 0800 Last data filed at 12/14/2022 0600 Gross per 24 hour  Intake 3145.74 ml  Output --  Net 3145.74 ml   Filed Weights   12/13/22 2338 12/14/22 0500 12/14/22 0646  Weight: 65.6 kg 65.6 kg 66.1 kg     Data Reviewed:   CBC: Recent Labs  Lab 12/13/22 1551 12/14/22 0017  WBC 10.7* 8.9  NEUTROABS 8.5*  --   HGB 15.8 12.5*  HCT 47.0 37.3*  MCV 94.8 94.9  PLT 475* 409*   Basic Metabolic Panel: Recent Labs  Lab 12/13/22 1551 12/14/22 0017  NA 138 137  K 4.0 3.6  CL 98 106  CO2 29 24  GLUCOSE 158* 117*  BUN 17 19  CREATININE 0.83 1.14  CALCIUM 9.1 7.9*  MG 1.9 1.6*   GFR: Estimated Creatinine Clearance: 59.1 mL/min (by C-G formula based on SCr of 1.14 mg/dL). Liver Function Tests: Recent Labs  Lab 12/13/22 1551 12/14/22 0017  AST 29 23  ALT 26 19  ALKPHOS 86 62  BILITOT 0.5 0.4  PROT 6.8 5.3*  ALBUMIN 3.4* 2.9*   Recent Labs  Lab 12/13/22 1551  LIPASE 38   No results for input(s): "AMMONIA" in the last 168 hours. Coagulation Profile: No results for input(s): "INR", "PROTIME" in the last 168 hours. Cardiac Enzymes: Recent Labs  Lab 12/13/22 1551  CKTOTAL 179   BNP (last 3 results) No results for input(s): "PROBNP" in the last 8760 hours. HbA1C: No results for input(s): "HGBA1C" in the last 72 hours. CBG: Recent Labs  Lab 12/13/22 1550  GLUCAP 168*   Lipid Profile: No results for input(s): "CHOL", "HDL", "LDLCALC", "TRIG", "CHOLHDL", "LDLDIRECT" in the last 72 hours. Thyroid Function Tests: Recent Labs    12/13/22 2000  TSH 2.081   Anemia Panel: Recent Labs    12/13/22 2000 12/14/22 0017  VITAMINB12 158*  --   FOLATE   --  7.0   Sepsis Labs: Recent Labs  Lab 12/13/22 1551 12/13/22 1754 12/13/22 2100 12/14/22 0017  LATICACIDVEN 3.9* 2.4* 2.3* 1.9    Recent Results (from the past 240 hour(s))  Resp panel by RT-PCR (RSV, Flu A&B, Covid) Anterior Nasal Swab     Status: None   Collection Time: 12/13/22  3:30 PM   Specimen: Anterior Nasal Swab  Result Value Ref Range Status   SARS Coronavirus 2 by RT PCR NEGATIVE NEGATIVE Final    Comment: (NOTE) SARS-CoV-2 target nucleic acids are NOT DETECTED.  The SARS-CoV-2 RNA is generally detectable in upper respiratory specimens during the acute phase of infection. The lowest concentration of SARS-CoV-2 viral copies this assay can detect is 138 copies/mL. A negative result does not preclude SARS-Cov-2 infection and should not be used as the sole basis for treatment or other patient management decisions. A negative  result may occur with  improper specimen collection/handling, submission of specimen other than nasopharyngeal swab, presence of viral mutation(s) within the areas targeted by this assay, and inadequate number of viral copies(<138 copies/mL). A negative result must be combined with clinical observations, patient history, and epidemiological information. The expected result is Negative.  Fact Sheet for Patients:  BloggerCourse.com  Fact Sheet for Healthcare Providers:  SeriousBroker.it  This test is no t yet approved or cleared by the [place] FDA and  has been authorized for detection and/or diagnosis of SARS-CoV-2 by FDA under an Emergency Use Authorization (EUA). This EUA will remain  in effect (meaning this test can be used) for the duration of the COVID-19 declaration under Section 564(b)(1) of the Act, 21 U.S.C.section 360bbb-3(b)(1), unless the authorization is terminated  or revoked sooner.       Influenza A by PCR NEGATIVE NEGATIVE Final   Influenza B by PCR NEGATIVE  NEGATIVE Final    Comment: (NOTE) The Xpert Xpress SARS-CoV-2/FLU/RSV plus assay is intended as an aid in the diagnosis of influenza from Nasopharyngeal swab specimens and should not be used as a sole basis for treatment. Nasal washings and aspirates are unacceptable for Xpert Xpress SARS-CoV-2/FLU/RSV testing.  Fact Sheet for Patients: BloggerCourse.com  Fact Sheet for Healthcare Providers: SeriousBroker.it  This test is not yet approved or cleared by the [place] FDA and has been authorized for detection and/or diagnosis of SARS-CoV-2 by FDA under an Emergency Use Authorization (EUA). This EUA will remain in effect (meaning this test can be used) for the duration of the COVID-19 declaration under Section 564(b)(1) of the Act, 21 U.S.C. section 360bbb-3(b)(1), unless the authorization is terminated or revoked.     Resp Syncytial Virus by PCR NEGATIVE NEGATIVE Final    Comment: (NOTE) Fact Sheet for Patients: BloggerCourse.com  Fact Sheet for Healthcare Providers: SeriousBroker.it  This test is not yet approved or cleared by the [place] FDA and has been authorized for detection and/or diagnosis of SARS-CoV-2 by FDA under an Emergency Use Authorization (EUA). This EUA will remain in effect (meaning this test can be used) for the duration of the COVID-19 declaration under Section 564(b)(1) of the Act, 21 U.S.C. section 360bbb-3(b)(1), unless the authorization is terminated or revoked.  Performed at [facility], [address] [place], [place] [address]          Radiology Studies: CT ABDOMEN PELVIS W CONTRAST  Result Date: 12/13/2022 CLINICAL DATA:  Diarrhea for 2 months EXAM: CT ABDOMEN AND PELVIS WITH CONTRAST TECHNIQUE: Multidetector CT imaging of the abdomen and pelvis was performed using the standard protocol following bolus  administration of intravenous contrast. RADIATION DOSE REDUCTION: This exam was performed according to the departmental dose-optimization program which includes automated exposure control, adjustment of the mA and/or kV according to patient size and/or use of iterative reconstruction technique. CONTRAST:  OMNIPAQUE IOHEXOL 300 MG/ML  SOLN COMPARISON:  CT chest, abdomen and pelvis dated April 10, 2021 FINDINGS: Lower chest: No acute abnormality. Hepatobiliary: No focal liver abnormality is seen. Gallstones with no evidence of gallbladder wall thickening. No biliary ductal dilation. Pancreas: Unremarkable. No pancreatic ductal dilatation or surrounding inflammatory changes. Spleen: Normal in size without focal abnormality. Adrenals/Urinary Tract: Bilateral adrenal glands are unremarkable. No hydronephrosis or nephrolithiasis. Bilateral low-attenuation renal lesions, largest are compatible with simple cysts, others are too small to completely characterize. Hyperdense lesion of the posterior bladder wall which is likely due to small layering bladder stones, seen on series  2, image 76. Stomach/Bowel: Stomach is within normal limits. Appendix appears normal. Diverticulosis. No evidence of bowel wall thickening, distention, or inflammatory changes. Vascular/Lymphatic: Aortic atherosclerosis. No enlarged abdominal or pelvic lymph nodes. Reproductive: Prostatomegaly. Other: No abdominal wall hernia or abnormality. No abdominopelvic ascites. Musculoskeletal: No acute or significant osseous findings. IMPRESSION: 1. No acute findings in the abdomen or pelvis. 2. Hyperattenuating of the posterior bladder wall which is likely due to small layering bladder stones, although small neoplasm can not be excluded. Recommend cystoscopy for further evaluation. 3. Cholelithiasis with no evidence of acute cholecystitis. 4. Prostatomegaly. 5. Aortic Atherosclerosis (ICD10-I70.0). Electronically Signed   By: Allegra LaiLeah  Strickland M.D.   On:  12/13/2022 17:11   DG Chest Portable 1 View  Result Date: 12/13/2022 CLINICAL DATA:  Weakness EXAM: PORTABLE CHEST 1 VIEW COMPARISON:  06/16/2021 FINDINGS: The heart size and mediastinal contours are within normal limits. Both lungs are clear. The visualized skeletal structures are unremarkable. IMPRESSION: No active disease. Electronically Signed   By: Jasmine PangKim  Fujinaga M.D.   On: 12/13/2022 16:02        Scheduled Meds:  enoxaparin (LOVENOX) injection  40 mg Subcutaneous Q24H   [START ON 12/15/2022] vitamin B-12  100 mcg Oral Daily   Continuous Infusions:  sodium chloride 100 mL/hr at 12/13/22 2326     LOS: 0 days   Time spent= 35 mins    Jaden Batchelder Joline Maxcyhirag Abron Neddo, MD [facility]  If 7PM-7AM, please contact night-coverage  12/14/2022, 8:00 AM
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
## 2022-12-14 NOTE — Evaluation (Addendum)
Physical Therapy Evaluation Patient Details Name: Stephen Delacruz MRN: [id number] DOB: 10/12/1956 Today's Date: 12/14/2022  History of Present Illness  66 yo male admitted with weakness, diarrhea, dehydration. Hx of bipolar d/o, back pain, PTSD, polysubstance abuse  Clinical Impression  Arrived to patient's room-pt in bed resting but with IV out of arm and with needle on the floor-made RN aware. Pt agreeable to working with therapy. He walked across the room x 2 with a RW (used due to pt c/o bl LE pain). Gait was mildly antalgic but he did not appear to be significantly relying on walker. Assisted pt back to bed at his request. Will plan to follow and progress activity as tolerated.        Recommendations for follow up therapy are one component of a multi-disciplinary discharge planning process, led by the attending physician.  Recommendations may be updated based on patient status, additional functional criteria and insurance authorization.  Follow Up Recommendations No PT follow up      Assistance Recommended at Discharge None  Patient can return home with the following  Assist for transportation;A little help with walking and/or transfers    Equipment Recommendations  RW vs cane (whichever pt feels he can manage best with outside-likely cane)  Recommendations for Other Services  OT consult    Functional Status Assessment Patient has had a recent decline in their functional status and demonstrates the ability to make significant improvements in function in a reasonable and predictable amount of time.     Precautions / Restrictions Precautions Precautions: Fall Restrictions Weight Bearing Restrictions: No      Mobility  Bed Mobility Overal bed mobility: Modified Independent                  Transfers Overall transfer level: Needs assistance Equipment used: Rolling walker (2 wheels) Transfers: Sit to/from Stand Sit to Stand: Supervision                 Ambulation/Gait Ambulation/Gait assistance: Supervision Gait Distance (Feet): 30 Feet Assistive device: Rolling walker (2 wheels) Gait Pattern/deviations: Step-through pattern, Decreased stride length, Antalgic       General Gait Details: Distance limited by patient report of pain in LEs. He used walker but he did not appear to rely on it.  Stairs            Wheelchair Mobility    Modified Rankin (Stroke Patients Only)       Balance Overall balance assessment: Mild deficits observed, not formally tested                                           Pertinent Vitals/Pain Pain Assessment Pain Assessment: 0-10 Pain Score: 7  Pain Location: legs Pain Intervention(s): Limited activity within patient's tolerance, Monitored during session, Repositioned    Home Living Family/patient expects to be discharged to:: Unsure     Type of Home: Homeless           Home Equipment: None      Prior Function Prior Level of Function : Independent/Modified Independent                     Hand Dominance        Extremity/Trunk Assessment   Upper Extremity Assessment Upper Extremity Assessment: Defer to OT evaluation    Lower Extremity Assessment Lower Extremity Assessment: Generalized weakness  Cervical / Trunk Assessment Cervical / Trunk Assessment: Normal  Communication   Communication: No difficulties  Cognition Arousal/Alertness: Awake/alert Behavior During Therapy: WFL for tasks assessed/performed, Flat affect Overall Cognitive Status: Within Functional Limits for tasks assessed                                          General Comments      Exercises     Assessment/Plan    PT Assessment Patient needs continued PT services  PT Problem List Decreased strength;Pain;Decreased activity tolerance;Decreased balance;Decreased mobility       PT Treatment Interventions DME instruction;Gait training;Therapeutic  exercise;Balance training;Functional mobility training;Therapeutic activities;Patient/family education    PT Goals (Current goals can be found in the Care Plan section)  Acute Rehab PT Goals Patient Stated Goal: none stated PT Goal Formulation: With patient Time For Goal Achievement: 12/28/22 Potential to Achieve Goals: Good    Frequency Min 3X/week     Co-evaluation               AM-PAC PT "6 Clicks" Mobility  Outcome Measure Help needed turning from your back to your side while in a flat bed without using bedrails?: None Help needed moving from lying on your back to sitting on the side of a flat bed without using bedrails?: None Help needed moving to and from a bed to a chair (including a wheelchair)?: A Little Help needed standing up from a chair using your arms (e.g., wheelchair or bedside chair)?: A Little Help needed to walk in hospital room?: A Little Help needed climbing 3-5 steps with a railing? : A Little 6 Click Score: 20    End of Session Equipment Utilized During Treatment: Gait belt Activity Tolerance: Patient tolerated treatment well;Patient limited by pain Patient left: in bed;with call bell/phone within reach;with bed alarm set   PT Visit Diagnosis: Pain Pain - Right/Left:  (bil) Pain - part of body: Leg    Time: 0737-1062 PT Time Calculation (min) (ACUTE ONLY): 15 min   Charges:   PT Evaluation $PT Eval Low Complexity: 1 Low             Faye Ramsay, PT Acute Rehabilitation  Office: [phone]

## 2022-12-15 DIAGNOSIS — R531 Weakness: Secondary | ICD-10-CM | POA: Diagnosis not present

## 2022-12-15 LAB — COMPREHENSIVE METABOLIC PANEL
ALT: 20 U/L (ref 0–44)
AST: 18 U/L (ref 15–41)
Albumin: 2.6 g/dL — ABNORMAL LOW (ref 3.5–5.0)
Alkaline Phosphatase: 53 U/L (ref 38–126)
Anion gap: 6 (ref 5–15)
BUN: 18 mg/dL (ref 8–23)
CO2: 24 mmol/L (ref 22–32)
Calcium: 8.2 mg/dL — ABNORMAL LOW (ref 8.9–10.3)
Chloride: 104 mmol/L (ref 98–111)
Creatinine, Ser: 0.76 mg/dL (ref 0.61–1.24)
GFR, Estimated: >60 mL/min (ref >60)
Glucose, Bld: 91 mg/dL (ref 70–99)
Potassium: 3.8 mmol/L (ref 3.5–5.1)
Sodium: 134 mmol/L — ABNORMAL LOW (ref 135–145)
Total Bilirubin: 0.6 mg/dL (ref 0.3–1.2)
Total Protein: 5.3 g/dL — ABNORMAL LOW (ref 6.5–8.1)

## 2022-12-15 LAB — CBC
HCT: 38.4 % — ABNORMAL LOW (ref 39.0–52.0)
Hemoglobin: 12.5 g/dL — ABNORMAL LOW (ref 13.0–17.0)
MCH: 31.5 pg (ref 26.0–34.0)
MCHC: 32.6 g/dL (ref 30.0–36.0)
MCV: 96.7 fL (ref 80.0–100.0)
Platelets: 328 10*3/uL (ref 150–400)
RBC: 3.97 MIL/uL — ABNORMAL LOW (ref 4.22–5.81)
RDW: 13.9 % (ref 11.5–15.5)
WBC: 5.5 10*3/uL (ref 4.0–10.5)
nRBC: 0 % (ref 0.0–0.2)

## 2022-12-15 LAB — MAGNESIUM: Magnesium: 2 mg/dL (ref 1.7–2.4)

## 2022-12-15 NOTE — TOC Progression Note (Addendum)
Transition of Care [facility]) - Progression Note   Patient Details  Name: Stephen Delacruz MRN: [id number] Date of Birth: [birth year]  [facility] of Care [facility]) CM/SW Contact  Ewing Schlein, LCSW Phone Number: 12/15/2022, 2:13 PM  Clinical Narrative: CSW left VM for [facility] [phone]) regarding possible referral. CSW also attempted to follow up with patient's APS caseworker, Stephen Delacruz [phone]), but was unable to reach her so VM was left requesting call back.  CSW followed up with patient's counselor, Stephen Delacruz [phone]), with [facility]. Per Stephen Delacruz, patient's APS caseworker was recently assigned to the patient around the time he was admitted to the hospital so she is not as familiar with the patient's case at this time. Stephen Delacruz reported the patient may need a guardian, but that has not been pursued at this point in time.  CSW asked about an ACT team for the patient. Per Stephen Delacruz, Medicare is rarely a payor source for ACT but he will follow up with a contact to see if there is one that will take the patient. CSW asked about realistic housing options for the patient. Stephen Delacruz explained the patient had housing from December 2020-April 2022, from which he was evicted during a manic episode when he was jailed after expressing a plan and "target" for homicide, so a group home will not be an option for the patient. Patient has been in and out of jail and shelters since this eviction. Patient also will not be able to live with family due to his history of homicide threats. [name]) is his HCPOA, but is out of contact for the holiday so his sister will be a point of contact for the duration of his hospitalization. Stephen Delacruz also will not be available again until January 4. Hospitalist and psych NP updated.  Addendum: 3:59pm-CSW received return call from Stephen Delacruz with APS and provided an update regarding patient. Stephen Delacruz will be out of office until January 3, but will request medical  records following discharge.  Barriers to Discharge: Continued Medical Work up  Expected Discharge Plan and Services In-house Referral: Clinical Social Work Post Acute Care Choice: NA Living arrangements for the past 2 months: Homeless            DME Arranged: N/A DME Agency: NA  Social Determinants of Health (SDOH) Interventions SDOH Screenings   Food Insecurity: Food Insecurity Present (12/13/2022)  Housing: High Risk (12/13/2022)  Transportation Needs: Unmet Transportation Needs (12/13/2022)  Utilities: At Risk (12/13/2022)  Alcohol Screen: Medium Risk (02/21/2021)  Depression (PHQ2-9): Medium Risk (09/15/2022)  Tobacco Use: Medium Risk (12/14/2022)   Readmission Risk Interventions     No data to display

## 2022-12-15 NOTE — Progress Notes (Signed)
PROGRESS NOTE    Stephen Delacruz  ZOX:[id number] DOB: 12/08/1956 DOA: 12/13/2022 PCP: System, Provider Not In   Brief Narrative:  66 y.o. male with medical history significant of bipolar/schizophrenia, HTN, hyperlipidemia, PTSD was [facility] generalized weakness.  Patient states for the past 2 weeks he has had loose watery diarrhea along with poor oral intake and during this time he has become progressively weaker where is unable to take care of himself.  Patient has been cycling through severe phases of mania and depression.  Currently his ongoing depression phase has been for last 2 months and has stopped taking all of his medication and not taking care of himself.  CT of the abdomen pelvis and all the workup has been unremarkable here besides dehydration and lactic acidosis.  Psychiatry team consulted.  Assessment & Plan:  Principal Problem:   Generalized weakness Active Problems:   Schizoaffective disorder, bipolar type (HCC)   Bipolar disorder, curr episode mixed, severe, with psychotic features (HCC)   HTN (hypertension)   Homelessness   Dehydration   Lactic acid acidosis    Generalized weakness improved Dehydration with lactic acidosis, resolved -No obvious evidence of infection noted.  COVID, flu was negative.  Lactic acidosis has not resolved with IV fluids.  UA, UDS negative.  TSH and folate is normal.  Vitamin B12 deficiency Vitamin D deficiency - Currently on supplements   Diarrhea, improved - Nonbloody.  Unclear etiology.  CT of the abdomen pelvis is negative for acute pathology.  GI panel and C. difficile has been ordered but diarrhea seems to have improved  Hypokalemia/hypomagnesemia - Repletion as needed   History of schizoaffective disorder Bipolar disorder - Has not been taking any of his medications.  Due to his severe history and medication noncompliance, seen by psychiatry  Counselor. Blair HeysDewey Mullis and his cell is [phone].     Essential hypertension - Not taking any home medication.  IV as needed ordered  PT/OT-no follow-up  DVT prophylaxis: Lovenox Code Status: DNR Family Communication:    TOC working with outpatient team for safe dispo     Body mass index is 22.35 kg/m.  Pressure Injury 12/13/22 Ischial tuberosity Right Unstageable - Full thickness tissue loss in which the base of the injury is covered by slough (yellow, tan, gray, green or brown) and/or eschar (tan, brown or black) in the wound bed. (Active)  12/13/22 1610  Location: Ischial tuberosity  Location Orientation: Right  Staging: Unstageable - Full thickness tissue loss in which the base of the injury is covered by slough (yellow, tan, gray, green or brown) and/or eschar (tan, brown or black) in the wound bed.  Wound Description (Comments):   Present on Admission: Yes        Subjective: Doing better no complaints.    Examination: Constitutional: Not in acute distress Respiratory: Clear to auscultation bilaterally Cardiovascular: Normal sinus rhythm, no rubs Abdomen: Nontender nondistended good bowel sounds Musculoskeletal: No edema noted Skin: No rashes seen Neurologic: CN 2-12 grossly intact.  And nonfocal Psychiatric: Normal judgment and insight. Alert and oriented x 3. Normal mood.    Objective: Vitals:   12/14/22 1406 12/14/22 2130 12/15/22 0500 12/15/22 0505  BP: 139/80 133/79  132/83  Pulse: 95 86  76  Resp: 14 16  18   Temp: 98 F (36.7 C) 99 F (37.2 C)  98.6 F (37 C)  TempSrc:      SpO2: 99% 98%  100%  Weight:   62.8 kg  Height:        Intake/Output Summary (Last 24 hours) at 12/15/2022 0752 Last data filed at 12/15/2022 0600 Gross per 24 hour  Intake 1354.73 ml  Output 1400 ml  Net -45.27 ml   Filed Weights   12/14/22 0500 12/14/22 0646 12/15/22 0500  Weight: 65.6 kg 66.1 kg 62.8 kg     Data Reviewed:   CBC: Recent Labs  Lab 12/13/22 1551 12/14/22 0017 12/15/22 0343  WBC 10.7* 8.9  5.5  NEUTROABS 8.5*  --   --   HGB 15.8 12.5* 12.5*  HCT 47.0 37.3* 38.4*  MCV 94.8 94.9 96.7  PLT 475* 409* 328   Basic Metabolic Panel: Recent Labs  Lab 12/13/22 1551 12/14/22 0017 12/15/22 0343  NA 138 137 134*  K 4.0 3.6 3.8  CL 98 106 104  CO2 29 24 24   GLUCOSE 158* 117* 91  BUN 17 19 18   CREATININE 0.83 1.14 0.76  CALCIUM 9.1 7.9* 8.2*  MG 1.9 1.6* 2.0   GFR: Estimated Creatinine Clearance: 82.9 mL/min (by C-G formula based on SCr of 0.76 mg/dL). Liver Function Tests: Recent Labs  Lab 12/13/22 1551 12/14/22 0017 12/15/22 0343  AST 29 23 18   ALT 26 19 20   ALKPHOS 86 62 53  BILITOT 0.5 0.4 0.6  PROT 6.8 5.3* 5.3*  ALBUMIN 3.4* 2.9* 2.6*   Recent Labs  Lab 12/13/22 1551  LIPASE 38   No results for input(s): "AMMONIA" in the last 168 hours. Coagulation Profile: No results for input(s): "INR", "PROTIME" in the last 168 hours. Cardiac Enzymes: Recent Labs  Lab 12/13/22 1551  CKTOTAL 179   BNP (last 3 results) No results for input(s): "PROBNP" in the last 8760 hours. HbA1C: No results for input(s): "HGBA1C" in the last 72 hours. CBG: Recent Labs  Lab 12/13/22 1550  GLUCAP 168*   Lipid Profile: No results for input(s): "CHOL", "HDL", "LDLCALC", "TRIG", "CHOLHDL", "LDLDIRECT" in the last 72 hours. Thyroid Function Tests: Recent Labs    12/13/22 2000  TSH 2.081   Anemia Panel: Recent Labs    12/13/22 2000 12/14/22 0017  VITAMINB12 158* 154*  FOLATE  --  7.0   Sepsis Labs: Recent Labs  Lab 12/13/22 1551 12/13/22 1754 12/13/22 2100 12/14/22 0017  LATICACIDVEN 3.9* 2.4* 2.3* 1.9    Recent Results (from the past 240 hour(s))  Resp panel by RT-PCR (RSV, Flu A&B, Covid) Anterior Nasal Swab     Status: None   Collection Time: 12/13/22  3:30 PM   Specimen: Anterior Nasal Swab  Result Value Ref Range Status   SARS Coronavirus 2 by RT PCR NEGATIVE NEGATIVE Final    Comment: (NOTE) SARS-CoV-2 target nucleic acids are NOT DETECTED.  The  SARS-CoV-2 RNA is generally detectable in upper respiratory specimens during the acute phase of infection. The lowest concentration of SARS-CoV-2 viral copies this assay can detect is 138 copies/mL. A negative result does not preclude SARS-Cov-2 infection and should not be used as the sole basis for treatment or other patient management decisions. A negative result may occur with  improper specimen collection/handling, submission of specimen other than nasopharyngeal swab, presence of viral mutation(s) within the areas targeted by this assay, and inadequate number of viral copies(<138 copies/mL). A negative result must be combined with clinical observations, patient history, and epidemiological information. The expected result is Negative.  Fact Sheet for Patients:  12/15/22  Fact Sheet for Healthcare Providers:  12/15/22  This test is no t yet approved or cleared by  the [employer] and  has been authorized for detection and/or diagnosis of SARS-CoV-2 by FDA under an Emergency Use Authorization (EUA). This EUA will remain  in effect (meaning this test can be used) for the duration of the COVID-19 declaration under Section 564(b)(1) of the Act, 21 U.S.C.section 360bbb-3(b)(1), unless the authorization is terminated  or revoked sooner.       Influenza A by PCR NEGATIVE NEGATIVE Final   Influenza B by PCR NEGATIVE NEGATIVE Final    Comment: (NOTE) The Xpert Xpress SARS-CoV-2/FLU/RSV plus assay is intended as an aid in the diagnosis of influenza from Nasopharyngeal swab specimens and should not be used as a sole basis for treatment. Nasal washings and aspirates are unacceptable for Xpert Xpress SARS-CoV-2/FLU/RSV testing.  Fact Sheet for Patients: BloggerCourse.com  Fact Sheet for Healthcare Providers: SeriousBroker.it  This test is not yet approved or  cleared by the [place] FDA and has been authorized for detection and/or diagnosis of SARS-CoV-2 by FDA under an Emergency Use Authorization (EUA). This EUA will remain in effect (meaning this test can be used) for the duration of the COVID-19 declaration under Section 564(b)(1) of the Act, 21 U.S.C. section 360bbb-3(b)(1), unless the authorization is terminated or revoked.     Resp Syncytial Virus by PCR NEGATIVE NEGATIVE Final    Comment: (NOTE) Fact Sheet for Patients: BloggerCourse.com  Fact Sheet for Healthcare Providers: SeriousBroker.it  This test is not yet approved or cleared by the [place] FDA and has been authorized for detection and/or diagnosis of SARS-CoV-2 by FDA under an Emergency Use Authorization (EUA). This EUA will remain in effect (meaning this test can be used) for the duration of the COVID-19 declaration under Section 564(b)(1) of the Act, 21 U.S.C. section 360bbb-3(b)(1), unless the authorization is terminated or revoked.  Performed at [facility], [address], [place], [place] [address]          Radiology Studies: CT ABDOMEN PELVIS W CONTRAST  Result Date: 12/13/2022 CLINICAL DATA:  Diarrhea for 2 months EXAM: CT ABDOMEN AND PELVIS WITH CONTRAST TECHNIQUE: Multidetector CT imaging of the abdomen and pelvis was performed using the standard protocol following bolus administration of intravenous contrast. RADIATION DOSE REDUCTION: This exam was performed according to the departmental dose-optimization program which includes automated exposure control, adjustment of the mA and/or kV according to patient size and/or use of iterative reconstruction technique. CONTRAST:  OMNIPAQUE IOHEXOL 300 MG/ML  SOLN COMPARISON:  CT chest, abdomen and pelvis dated April 10, 2021 FINDINGS: Lower chest: No acute abnormality. Hepatobiliary: No focal liver abnormality is seen. Gallstones  with no evidence of gallbladder wall thickening. No biliary ductal dilation. Pancreas: Unremarkable. No pancreatic ductal dilatation or surrounding inflammatory changes. Spleen: Normal in size without focal abnormality. Adrenals/Urinary Tract: Bilateral adrenal glands are unremarkable. No hydronephrosis or nephrolithiasis. Bilateral low-attenuation renal lesions, largest are compatible with simple cysts, others are too small to completely characterize. Hyperdense lesion of the posterior bladder wall which is likely due to small layering bladder stones, seen on series 2, image 76. Stomach/Bowel: Stomach is within normal limits. Appendix appears normal. Diverticulosis. No evidence of bowel wall thickening, distention, or inflammatory changes. Vascular/Lymphatic: Aortic atherosclerosis. No enlarged abdominal or pelvic lymph nodes. Reproductive: Prostatomegaly. Other: No abdominal wall hernia or abnormality. No abdominopelvic ascites. Musculoskeletal: No acute or significant osseous findings. IMPRESSION: 1. No acute findings in the abdomen or pelvis. 2. Hyperattenuating of the posterior bladder wall which is likely due to small layering bladder stones,  although small neoplasm can not be excluded. Recommend cystoscopy for further evaluation. 3. Cholelithiasis with no evidence of acute cholecystitis. 4. Prostatomegaly. 5. Aortic Atherosclerosis (ICD10-I70.0). Electronically Signed   By: Allegra Lai M.D.   On: 12/13/2022 17:11   DG Chest Portable 1 View  Result Date: 12/13/2022 CLINICAL DATA:  Weakness EXAM: PORTABLE CHEST 1 VIEW COMPARISON:  06/16/2021 FINDINGS: The heart size and mediastinal contours are within normal limits. Both lungs are clear. The visualized skeletal structures are unremarkable. IMPRESSION: No active disease. Electronically Signed   By: Jasmine Pang M.D.   On: 12/13/2022 16:02        Scheduled Meds:  cholecalciferol  400 Units Oral Daily   enoxaparin (LOVENOX) injection  40 mg  Subcutaneous Q24H   vitamin B-12  100 mcg Oral Daily   Continuous Infusions:     LOS: 1 day   Time spent= 35 mins    Jerome Otter Joline Maxcy, MD [facility]  If 7PM-7AM, please contact night-coverage  12/15/2022, 7:52 AM

## 2022-12-15 NOTE — Progress Notes (Signed)
Mobility Specialist - Progress Note  BP Sitting: 162/91 (110) mmHg Pulse Sitting: 92 bpm   BP Standing: 164/86 (109) mmHg Pulse Standing: 108 bpm    12/15/22 0953  Mobility  Activity Stood at bedside;Transferred from bed to chair  Level of Assistance Contact guard assist, steadying assist  Assistive Device None  Activity Response Tolerated well  Mobility Referral Yes  $Mobility charge 1 Mobility   Pt was found in bed and agreeable to try ambulation in room. Stated not using any AD and once standing on the side of the bed was unsteady and said he felt wobbly and dizzy. Pt was told to sit back down on the EOB to take BP (162/91 mmHg) and pulse (92 bpm). Pt stood on the side of the bed afterwards to take standing BP (164/86 mmHg) and pulse (108 bpm). Pt was transferred to chair after pt stated that standing up made him feel more dizzy than sitting on EOB. At EOS was left in recliner chair with all necessities in reach and NT notified.  Billey Chang Mobility Specialist

## 2022-12-16 DIAGNOSIS — R531 Weakness: Secondary | ICD-10-CM | POA: Diagnosis not present

## 2022-12-16 LAB — MAGNESIUM: Magnesium: 2.1 mg/dL (ref 1.7–2.4)

## 2022-12-16 LAB — COMPREHENSIVE METABOLIC PANEL
ALT: 19 U/L (ref 0–44)
AST: 17 U/L (ref 15–41)
Albumin: 3 g/dL — ABNORMAL LOW (ref 3.5–5.0)
Alkaline Phosphatase: 56 U/L (ref 38–126)
Anion gap: 7 (ref 5–15)
BUN: 20 mg/dL (ref 8–23)
CO2: 23 mmol/L (ref 22–32)
Calcium: 8.5 mg/dL — ABNORMAL LOW (ref 8.9–10.3)
Chloride: 109 mmol/L (ref 98–111)
Creatinine, Ser: 0.85 mg/dL (ref 0.61–1.24)
GFR, Estimated: >60 mL/min (ref >60)
Glucose, Bld: 88 mg/dL (ref 70–99)
Potassium: 4 mmol/L (ref 3.5–5.1)
Sodium: 139 mmol/L (ref 135–145)
Total Bilirubin: 0.7 mg/dL (ref 0.3–1.2)
Total Protein: 5.8 g/dL — ABNORMAL LOW (ref 6.5–8.1)

## 2022-12-16 LAB — CBC
HCT: 41.4 % (ref 39.0–52.0)
Hemoglobin: 13.3 g/dL (ref 13.0–17.0)
MCH: 31.6 pg (ref 26.0–34.0)
MCHC: 32.1 g/dL (ref 30.0–36.0)
MCV: 98.3 fL (ref 80.0–100.0)
Platelets: 366 10*3/uL (ref 150–400)
RBC: 4.21 MIL/uL — ABNORMAL LOW (ref 4.22–5.81)
RDW: 14 % (ref 11.5–15.5)
WBC: 6.8 10*3/uL (ref 4.0–10.5)
nRBC: 0 % (ref 0.0–0.2)

## 2022-12-16 MED ORDER — OXCARBAZEPINE 150 MG PO TABS: 150.0000 mg | ORAL_TABLET | Freq: Two times a day (BID) | ORAL | 0 refills | Status: DC

## 2022-12-16 MED ORDER — OLANZAPINE 2.5 MG PO TABS: 2.5000 mg | ORAL_TABLET | Freq: Every day | ORAL | 0 refills | Status: DC

## 2022-12-16 MED ORDER — CHOLECALCIFEROL 10 MCG (400 UNIT) PO TABS: 400.0000 [IU] | ORAL_TABLET | Freq: Every day | ORAL | Status: DC

## 2022-12-16 MED ORDER — SERTRALINE HCL 50 MG PO TABS: 50.0000 mg | ORAL_TABLET | Freq: Every day | ORAL | 0 refills | Status: DC

## 2022-12-16 MED ORDER — ATORVASTATIN CALCIUM 40 MG PO TABS: 40.0000 mg | ORAL_TABLET | Freq: Every day | ORAL | 0 refills | Status: DC

## 2022-12-16 MED ORDER — AMLODIPINE BESYLATE 10 MG PO TABS: 10.0000 mg | ORAL_TABLET | Freq: Every day | ORAL | 0 refills | Status: DC

## 2022-12-16 MED ORDER — VITAMIN D (ERGOCALCIFEROL) 1.25 MG (50000 UNIT) PO CAPS
50000.0000 [IU] | ORAL_CAPSULE | Freq: Once | ORAL | Status: AC
  Administered 2022-12-16: 50000 [IU] via ORAL
  Filled 2022-12-16: qty 1

## 2022-12-16 MED ORDER — CYANOCOBALAMIN 100 MCG PO TABS: 100.0000 ug | ORAL_TABLET | Freq: Every day | ORAL | 0 refills | Status: AC

## 2022-12-16 MED ORDER — CHOLECALCIFEROL 10 MCG (400 UNIT) PO TABS: 400.0000 [IU] | ORAL_TABLET | Freq: Every day | ORAL | 0 refills | Status: DC

## 2022-12-16 NOTE — Discharge Summary (Signed)
Physician Discharge Summary  Stephen Delacruz SEG:[id number] DOB: [birth year] DOA: 12/13/2022  PCP: System, Provider Not In  Admit date: 12/13/2022 Discharge date: 12/16/2022  Admitted From: [facility] Disposition: IRC/with family  Recommendations for Outpatient Follow-up:  Follow up with PCP in 1-2 weeks Please obtain BMP/CBC in one week your next doctors visit.  Prescriptions [facility] Needs to follow-up outpatient psychiatry   Discharge Condition: Stable CODE STATUS: DNR Diet recommendation: Regular  Brief/Interim Summary: 66 y.o. male with medical history significant of bipolar/schizophrenia, HTN, hyperlipidemia, PTSD was brought to the hospital from [facility] for generalized weakness.  Patient states for the past 2 weeks he has had loose watery diarrhea along with poor oral intake and during this time he has become progressively weaker where is unable to take care of himself.  Patient has been cycling through severe phases of mania and depression.  Currently his ongoing depression phase has been for last 2 months and has stopped taking all of his medication and not taking care of himself.  CT of the abdomen pelvis and all the workup has been unremarkable here besides dehydration and lactic acidosis.  Psychiatry team consulted.  Eventually patient was cleared by psychiatry for outpatient follow-up with continuing the same medications.   Assessment & Plan:  Principal Problem:   Generalized weakness Active Problems:   Schizoaffective disorder, bipolar type (HCC)   Bipolar disorder, curr episode mixed, severe, with psychotic features (HCC)   HTN (hypertension)   Homelessness   Dehydration   Lactic acid acidosis     Generalized weakness resolved Dehydration with lactic acidosis, resolved -No obvious evidence of infection noted.  COVID, flu was negative.  Lactic acidosis has not resolved with IV fluids.  UA, UDS negative.  TSH and folate is normal.   Doing well with physical therapy   Vitamin B12 deficiency Vitamin D deficiency - Currently on supplements, prescription given   Diarrhea, resolved - Nonbloody.  Unclear etiology.  CT of the abdomen pelvis is negative for acute pathology.  GI panel and C. difficile has been ordered but diarrhea seems to have improved   Hypokalemia/hypomagnesemia - Repletion as needed   History of schizoaffective disorder Bipolar disorder - Has not been taking any of his medications.  Due to his severe history and medication noncompliance, seen by psychiatry.  Does not meet inpatient criteria.  Discussed case with his outpatient counselor.  Patient will be discharged today   Counselor. Blair Heys and his cell is [phone].    Essential hypertension - Not taking any home medication.  IV as needed ordered          Body mass index is 22.35 kg/m.  Pressure Injury 12/13/22 Ischial tuberosity Right Unstageable - Full thickness tissue loss in which the base of the injury is covered by slough (yellow, tan, gray, green or brown) and/or eschar (tan, brown or black) in the wound bed. (Active)  12/13/22 1610  Location: Ischial tuberosity  Location Orientation: Right  Staging: Unstageable - Full thickness tissue loss in which the base of the injury is covered by slough (yellow, tan, gray, green or brown) and/or eschar (tan, brown or black) in the wound bed.  Wound Description (Comments):   Present on Admission: Yes      Discharge Diagnoses:  Principal Problem:   Generalized weakness Active Problems:   Schizoaffective disorder, bipolar type (HCC)   Bipolar disorder, curr episode mixed, severe, with psychotic features (HCC)   HTN (hypertension)   Homelessness  Dehydration   Lactic acid acidosis      Consultations: Psychiatry  Subjective: Feeling well no complaints.  Wishing to be discharged Denies any suicidal or homicidal ideations  Discharge Exam: Vitals:   12/15/22 1419  12/16/22 0500  BP: (!) 146/79 112/82  Pulse: 87 86  Resp: 20 16  Temp: 97.7 F (36.5 C) 98.6 F (37 C)  SpO2: 99% 98%   Vitals:   12/15/22 0505 12/15/22 1419 12/16/22 0500 12/16/22 0500  BP: 132/83 (!) 146/79  112/82  Pulse: 76 87  86  Resp: 18 20  16   Temp: 98.6 F (37 C) 97.7 F (36.5 C)  98.6 F (37 C)  TempSrc:      SpO2: 100% 99%  98%  Weight:   62.8 kg   Height:        General: Pt is alert, awake, not in acute distress Cardiovascular: RRR, S1/S2 +, no rubs, no gallops Respiratory: CTA bilaterally, no wheezing, no rhonchi Abdominal: Soft, NT, ND, bowel sounds + Extremities: no edema, no cyanosis  Discharge Instructions   Allergies as of 12/16/2022       Reactions   Prolixin [fluphenazine] Other (See Comments)   Slurred speech , EPS- Extrapyramidal symptoms (affected motor control and coordination)        Medication List     TAKE these medications    amLODipine 10 MG tablet Commonly known as: NORVASC Take 1 tablet (10 mg total) by mouth daily.   atorvastatin 40 MG tablet Commonly known as: LIPITOR Take 1 tablet (40 mg total) by mouth at bedtime.   cholecalciferol 10 MCG (400 UNIT) Tabs tablet Commonly known as: VITAMIN D3 Take 1 tablet (400 Units total) by mouth daily. Start taking on: December 22, 2022   cyanocobalamin 100 MCG tablet Take 1 tablet (100 mcg total) by mouth daily. Start taking on: December 17, 2022   OLANZapine 2.5 MG tablet Commonly known as: ZYPREXA Take 1 tablet (2.5 mg total) by mouth at bedtime. What changed: Another medication with the same name was removed. Continue taking this medication, and follow the directions you see here.   OXcarbazepine 150 MG tablet Commonly known as: TRILEPTAL Take 1 tablet (150 mg total) by mouth 2 (two) times daily.   sertraline 50 MG tablet Commonly known as: ZOLOFT Take 1 tablet (50 mg total) by mouth daily.        Follow-up Information     Services, [facility].    Why: CST referral Contact information: [facility] [place] [id number] [phone]                Allergies  Allergen Reactions   Prolixin [Fluphenazine] Other (See Comments)    Slurred speech , EPS- Extrapyramidal symptoms (affected motor control and coordination)    You were cared for by a hospitalist during your hospital stay. If you have any questions about your discharge medications or the care you received while you were in the hospital after you are discharged, you can call the unit and asked to speak with the hospitalist on call if the hospitalist that took care of you is not available. Once you are discharged, your primary care physician will handle any further medical issues. Please note that no refills for any discharge medications will be authorized once you are discharged, as it is imperative that you return to your primary care physician (or establish a relationship with a primary care physician if you do not have one) for your aftercare  needs so that they can reassess your need for medications and monitor your lab values.   Procedures/Studies: CT ABDOMEN PELVIS W CONTRAST  Result Date: 12/13/2022 CLINICAL DATA:  Diarrhea for 2 months EXAM: CT ABDOMEN AND PELVIS WITH CONTRAST TECHNIQUE: Multidetector CT imaging of the abdomen and pelvis was performed using the standard protocol following bolus administration of intravenous contrast. RADIATION DOSE REDUCTION: This exam was performed according to the departmental dose-optimization program which includes automated exposure control, adjustment of the mA and/or kV according to patient size and/or use of iterative reconstruction technique. CONTRAST:  OMNIPAQUE IOHEXOL 300 MG/ML  SOLN COMPARISON:  CT chest, abdomen and pelvis dated April 10, 2021 FINDINGS: Lower chest: No acute abnormality. Hepatobiliary: No focal liver abnormality is seen. Gallstones with no evidence of gallbladder wall thickening. No biliary ductal  dilation. Pancreas: Unremarkable. No pancreatic ductal dilatation or surrounding inflammatory changes. Spleen: Normal in size without focal abnormality. Adrenals/Urinary Tract: Bilateral adrenal glands are unremarkable. No hydronephrosis or nephrolithiasis. Bilateral low-attenuation renal lesions, largest are compatible with simple cysts, others are too small to completely characterize. Hyperdense lesion of the posterior bladder wall which is likely due to small layering bladder stones, seen on series 2, image 76. Stomach/Bowel: Stomach is within normal limits. Appendix appears normal. Diverticulosis. No evidence of bowel wall thickening, distention, or inflammatory changes. Vascular/Lymphatic: Aortic atherosclerosis. No enlarged abdominal or pelvic lymph nodes. Reproductive: Prostatomegaly. Other: No abdominal wall hernia or abnormality. No abdominopelvic ascites. Musculoskeletal: No acute or significant osseous findings. IMPRESSION: 1. No acute findings in the abdomen or pelvis. 2. Hyperattenuating of the posterior bladder wall which is likely due to small layering bladder stones, although small neoplasm can not be excluded. Recommend cystoscopy for further evaluation. 3. Cholelithiasis with no evidence of acute cholecystitis. 4. Prostatomegaly. 5. Aortic Atherosclerosis (ICD10-I70.0). Electronically Signed   By: Allegra Lai M.D.   On: 12/13/2022 17:11   DG Chest Portable 1 View  Result Date: 12/13/2022 CLINICAL DATA:  Weakness EXAM: PORTABLE CHEST 1 VIEW COMPARISON:  06/16/2021 FINDINGS: The heart size and mediastinal contours are within normal limits. Both lungs are clear. The visualized skeletal structures are unremarkable. IMPRESSION: No active disease. Electronically Signed   By: Jasmine Pang M.D.   On: 12/13/2022 16:02     The results of significant diagnostics from this hospitalization (including imaging, microbiology, ancillary and laboratory) are listed below for reference.      Microbiology: Recent Results (from the past 240 hour(s))  Resp panel by RT-PCR (RSV, Flu A&B, Covid) Anterior Nasal Swab     Status: None   Collection Time: 12/13/22  3:30 PM   Specimen: Anterior Nasal Swab  Result Value Ref Range Status   SARS Coronavirus 2 by RT PCR NEGATIVE NEGATIVE Final    Comment: (NOTE) SARS-CoV-2 target nucleic acids are NOT DETECTED.  The SARS-CoV-2 RNA is generally detectable in upper respiratory specimens during the acute phase of infection. The lowest concentration of SARS-CoV-2 viral copies this assay can detect is 138 copies/mL. A negative result does not preclude SARS-Cov-2 infection and should not be used as the sole basis for treatment or other patient management decisions. A negative result may occur with  improper specimen collection/handling, submission of specimen other than nasopharyngeal swab, presence of viral mutation(s) within the areas targeted by this assay, and inadequate number of viral copies(<138 copies/mL). A negative result must be combined with clinical observations, patient history, and epidemiological information. The expected result is Negative.  Fact Sheet for Patients:  BloggerCourse.com  Fact Sheet for Healthcare Providers:  SeriousBroker.it  This test is no t yet approved or cleared by the [place] FDA and  has been authorized for detection and/or diagnosis of SARS-CoV-2 by FDA under an Emergency Use Authorization (EUA). This EUA will remain  in effect (meaning this test can be used) for the duration of the COVID-19 declaration under Section 564(b)(1) of the Act, 21 U.S.C.section 360bbb-3(b)(1), unless the authorization is terminated  or revoked sooner.       Influenza A by PCR NEGATIVE NEGATIVE Final   Influenza B by PCR NEGATIVE NEGATIVE Final    Comment: (NOTE) The Xpert Xpress SARS-CoV-2/FLU/RSV plus assay is intended as an aid in the diagnosis of  influenza from Nasopharyngeal swab specimens and should not be used as a sole basis for treatment. Nasal washings and aspirates are unacceptable for Xpert Xpress SARS-CoV-2/FLU/RSV testing.  Fact Sheet for Patients: BloggerCourse.com  Fact Sheet for Healthcare Providers: SeriousBroker.it  This test is not yet approved or cleared by the [place] FDA and has been authorized for detection and/or diagnosis of SARS-CoV-2 by FDA under an Emergency Use Authorization (EUA). This EUA will remain in effect (meaning this test can be used) for the duration of the COVID-19 declaration under Section 564(b)(1) of the Act, 21 U.S.C. section 360bbb-3(b)(1), unless the authorization is terminated or revoked.     Resp Syncytial Virus by PCR NEGATIVE NEGATIVE Final    Comment: (NOTE) Fact Sheet for Patients: BloggerCourse.com  Fact Sheet for Healthcare Providers: SeriousBroker.it  This test is not yet approved or cleared by the [place] FDA and has been authorized for detection and/or diagnosis of SARS-CoV-2 by FDA under an Emergency Use Authorization (EUA). This EUA will remain in effect (meaning this test can be used) for the duration of the COVID-19 declaration under Section 564(b)(1) of the Act, 21 U.S.C. section 360bbb-3(b)(1), unless the authorization is terminated or revoked.  Performed at [facility], [address] [place], [place] [address]      Labs: BNP (last 3 results) No results for input(s): "BNP" in the last 8760 hours. Basic Metabolic Panel: Recent Labs  Lab 12/13/22 1551 12/14/22 0017 12/15/22 0343 12/16/22 0414  NA 138 137 134* 139  K 4.0 3.6 3.8 4.0  CL 98 106 104 109  CO2 GLUCOSE 158* 117* 91 88  BUN CREATININE 0.83 1.14 0.76 0.85  CALCIUM 9.1 7.9* 8.2* 8.5*  MG 1.9 1.6* 2.0 2.1   Liver Function  Tests: Recent Labs  Lab 12/13/22 1551 12/14/22 0017 12/15/22 0343 12/16/22 0414  AST ALT ALKPHOS 86 62 53 56  BILITOT 0.5 0.4 0.6 0.7  PROT 6.8 5.3* 5.3* 5.8*  ALBUMIN 3.4* 2.9* 2.6* 3.0*   Recent Labs  Lab 12/13/22 1551  LIPASE 38   No results for input(s): "AMMONIA" in the last 168 hours. CBC: Recent Labs  Lab 12/13/22 1551 12/14/22 0017 12/15/22 0343 12/16/22 0414  WBC 10.7* 8.9 5.5 6.8  NEUTROABS 8.5*  --   --   --   HGB 15.8 12.5* 12.5* 13.3  HCT 47.0 37.3* 38.4* 41.4  MCV 94.8 94.9 96.7 98.3  PLT 475* 409* 328 366   Cardiac Enzymes: Recent Labs  Lab 12/13/22 1551  CKTOTAL 179   BNP: Invalid input(s): "POCBNP" CBG: Recent Labs  Lab 12/13/22 1550  GLUCAP 168*   D-Dimer No results for input(s): "DDIMER" in  the last 72 hours. Hgb A1c No results for input(s): "HGBA1C" in the last 72 hours. Lipid Profile No results for input(s): "CHOL", "HDL", "LDLCALC", "TRIG", "CHOLHDL", "LDLDIRECT" in the last 72 hours. Thyroid function studies Recent Labs    12/13/22 2000  TSH 2.081   Anemia work up Recent Labs    12/13/22 2000 12/14/22 0017  VITAMINB12 158* 154*  FOLATE  --  7.0   Urinalysis    Component Value Date/Time   COLORURINE YELLOW 12/13/2022 2059   APPEARANCEUR CLEAR 12/13/2022 2059   LABSPEC 1.031 (H) 12/13/2022 2059   PHURINE 6.0 12/13/2022 2059   GLUCOSEU NEGATIVE 12/13/2022 2059   HGBUR NEGATIVE 12/13/2022 2059   BILIRUBINUR NEGATIVE 12/13/2022 2059   KETONESUR NEGATIVE 12/13/2022 2059   PROTEINUR NEGATIVE 12/13/2022 2059   UROBILINOGEN 0.2 05/09/2015 1027   NITRITE NEGATIVE 12/13/2022 2059   LEUKOCYTESUR NEGATIVE 12/13/2022 2059   Sepsis Labs Recent Labs  Lab 12/13/22 1551 12/14/22 0017 12/15/22 0343 12/16/22 0414  WBC 10.7* 8.9 5.5 6.8   Microbiology Recent Results (from the past 240 hour(s))  Resp panel by RT-PCR (RSV, Flu A&B, Covid) Anterior Nasal Swab     Status: None   Collection Time:  12/13/22  3:30 PM   Specimen: Anterior Nasal Swab  Result Value Ref Range Status   SARS Coronavirus 2 by RT PCR NEGATIVE NEGATIVE Final    Comment: (NOTE) SARS-CoV-2 target nucleic acids are NOT DETECTED.  The SARS-CoV-2 RNA is generally detectable in upper respiratory specimens during the acute phase of infection. The lowest concentration of SARS-CoV-2 viral copies this assay can detect is 138 copies/mL. A negative result does not preclude SARS-Cov-2 infection and should not be used as the sole basis for treatment or other patient management decisions. A negative result may occur with  improper specimen collection/handling, submission of specimen other than nasopharyngeal swab, presence of viral mutation(s) within the areas targeted by this assay, and inadequate number of viral copies(<138 copies/mL). A negative result must be combined with clinical observations, patient history, and epidemiological information. The expected result is Negative.  Fact Sheet for Patients:  BloggerCourse.com  Fact Sheet for Healthcare Providers:  SeriousBroker.it  This test is no t yet approved or cleared by the [place] FDA and  has been authorized for detection and/or diagnosis of SARS-CoV-2 by FDA under an Emergency Use Authorization (EUA). This EUA will remain  in effect (meaning this test can be used) for the duration of the COVID-19 declaration under Section 564(b)(1) of the Act, 21 U.S.C.section 360bbb-3(b)(1), unless the authorization is terminated  or revoked sooner.       Influenza A by PCR NEGATIVE NEGATIVE Final   Influenza B by PCR NEGATIVE NEGATIVE Final    Comment: (NOTE) The Xpert Xpress SARS-CoV-2/FLU/RSV plus assay is intended as an aid in the diagnosis of influenza from Nasopharyngeal swab specimens and should not be used as a sole basis for treatment. Nasal washings and aspirates are unacceptable for Xpert Xpress  SARS-CoV-2/FLU/RSV testing.  Fact Sheet for Patients: BloggerCourse.com  Fact Sheet for Healthcare Providers: SeriousBroker.it  This test is not yet approved or cleared by the [place] FDA and has been authorized for detection and/or diagnosis of SARS-CoV-2 by FDA under an Emergency Use Authorization (EUA). This EUA will remain in effect (meaning this test can be used) for the duration of the COVID-19 declaration under Section 564(b)(1) of the Act, 21 U.S.C. section 360bbb-3(b)(1), unless the authorization is terminated or revoked.     Resp Syncytial Virus  by PCR NEGATIVE NEGATIVE Final    Comment: (NOTE) Fact Sheet for Patients: BloggerCourse.com  Fact Sheet for Healthcare Providers: SeriousBroker.it  This test is not yet approved or cleared by the [place] FDA and has been authorized for detection and/or diagnosis of SARS-CoV-2 by FDA under an Emergency Use Authorization (EUA). This EUA will remain in effect (meaning this test can be used) for the duration of the COVID-19 declaration under Section 564(b)(1) of the Act, 21 U.S.C. section 360bbb-3(b)(1), unless the authorization is terminated or revoked.  Performed at [facility], [address], [place], [place] [address]      Time coordinating discharge:  I have spent 35 minutes face to face with the patient and on the ward discussing the patients care, assessment, plan and disposition with other care givers. >50% of the time was devoted counseling the patient about the risks and benefits of treatment/Discharge disposition and coordinating care.   SIGNED:   Dimple Nanas, MD  [facility] 12/16/2022, 9:52 AM   If 7PM-7AM, please contact night-coverage

## 2022-12-16 NOTE — TOC Transition Note (Signed)
Transition of Care [facility]) - CM/SW Discharge Note   Patient Details  Name: Stephen Delacruz MRN: [id number] Date of Birth: 1956-05-07  [facility] CM/SW Contact:  Larrie Kass, LCSW Phone Number: 12/16/2022, 10:07 AM   Clinical Narrative:    CSW spoke with pt about preferred pharmacy , [facility] [address]. Pt requested bus pass when discharged. CSW provided bus pass to pt's RN.  10:15am CSW received a call from pt's brother Annette Stable , CSW provided update. Pt's brother reported pt's sister will be bringing pt's clothing and will pick pt medications up. It is not clear if pt's sister will take pt with her at discharge.     Barriers to Discharge: Continued Medical Work up   Patient Goals and CMS Choice   Choice offered to / list presented to : NA  Discharge Placement                         Discharge Plan and Services Additional resources added to the After Visit Summary for   In-house Referral: Clinical Social Work   Post Acute Care Choice: NA          DME Arranged: N/A DME Agency: NA                  Social Determinants of Health (SDOH) Interventions SDOH Screenings   Food Insecurity: Food Insecurity Present (12/13/2022)  Housing: High Risk (12/13/2022)  Transportation Needs: Unmet Transportation Needs (12/13/2022)  Utilities: At Risk (12/13/2022)  Alcohol Screen: Medium Risk (02/21/2021)  Depression (PHQ2-9): Medium Risk (09/15/2022)  Tobacco Use: Medium Risk (12/14/2022)     Readmission Risk Interventions     No data to display

## 2022-12-16 NOTE — Progress Notes (Signed)
Pt alert and oriented. Discharge instructions discussed with pt and sister. Provided resources for shelter. Sister at bedside to transport pt.

## 2023-01-14 ENCOUNTER — Ambulatory Visit (HOSPITAL_COMMUNITY)
Admission: EM | Admit: 2023-01-14 | Discharge: 2023-01-14 | Disposition: A | Discharge status: Home / Self Care | Payer: Medicare Other | Attending: Family | Admitting: Family

## 2023-01-14 DIAGNOSIS — Z760 Encounter for issue of repeat prescription: Secondary | ICD-10-CM | POA: Diagnosis present

## 2023-01-14 DIAGNOSIS — Z5902 Unsheltered homelessness: Secondary | ICD-10-CM | POA: Insufficient documentation

## 2023-01-14 DIAGNOSIS — Z59819 Housing instability, housed unspecified: Secondary | ICD-10-CM

## 2023-01-14 DIAGNOSIS — F32A Depression, unspecified: Secondary | ICD-10-CM | POA: Diagnosis not present

## 2023-01-14 MED ORDER — SERTRALINE HCL 50 MG PO TABS: 50.0000 mg | ORAL_TABLET | Freq: Every day | ORAL | Status: DC

## 2023-01-14 MED ORDER — OLANZAPINE 2.5 MG PO TABS: 2.5000 mg | ORAL_TABLET | Freq: Every day | ORAL | Status: DC

## 2023-01-14 MED ORDER — OXCARBAZEPINE 150 MG PO TABS: 150.0000 mg | ORAL_TABLET | Freq: Two times a day (BID) | ORAL | Status: DC

## 2023-01-14 NOTE — Discharge Instructions (Signed)

## 2023-01-14 NOTE — ED Provider Notes (Addendum)
Behavioral Health Urgent Care Medical Screening Exam  Patient Name: Stephen Delacruz MRN: [id number] Date of Evaluation: 01/14/23 Chief Complaint:   " I need a place to stay." Diagnosis:  Final diagnoses:  Housing insecurity  Medication refill    History of Present illness: Stephen Delacruz is a 67 y.o. male presented to [facility] seeking housing. Stated " I need to get a place to stay."  Denied suicidal or homicidal ideations.  Denies auditory visual hallucinations.  Patient is well-known to this service.  He has a charted history with schizoaffective disorder, bipolar disorder, substance induced mood disorder and major depressive disorder.  States he is currently followed by Berenda Morale for medication management however stated he is not taking his medication as indicated.  He provided verbal authorization to follow-up with his brother and sister who is currently in the lobby.  NP and TTS counselor M. Mare Ferrari spoke to the patient's brother [name] for additional collateral. [name] reports they are attempted to secure ACTT services  to assist with housing and medications. [name] stated that patient has 2 days left of medication from his previous inpatient admission and would need medication refills. Bill stated patient was found wandering the streets in the cold by the Crisis team and has been really depressed.  [name] is  sitting, slightly tremulous appears to be a poor historian however, is not in any acute distress .  he is alert/oriented x 4; calm/cooperative; and mood congruent with affect. he is speaking in a clear tone at moderate volume, and normal pace; with good eye contact. Her thought process is coherent and relevant; There is no indication that he is currently responding to internal/external stimuli or experiencing delusional thought content; and he has denied suicidal/self-harm/homicidal ideation, psychosis, and paranoia.  Patient has remained calm throughout assessment  and has answered questions appropriately.     [name] is educated and verbalizes understanding of mental health resources and other crisis services in the community. He is instructed to call 911 and present to the nearest emergency room should he experience any suicidal/homicidal ideation, auditory/visual/hallucinations, or detrimental worsening of his mental health condition. He was a also advised by Probation officer that he could call the toll-free phone on insurance card to assist with identifying in network counselors and agencies or number on back of Medicaid card to speak with care coordinator     [place] ED to Hosp-Admission (Discharged) from 12/13/2022 in [place] ED from 12/04/2022 in [facility] Emergency Department at [facility] ED from 11/23/2022 in [facility] Emergency Department at [place] No Risk No Risk No Risk       Psychiatric Specialty Exam  Presentation  General Appearance:Fairly Groomed  Eye Contact:Good  Speech:Clear and Coherent  Speech Volume:Decreased  Handedness:Right   Mood and Affect  Mood: Euthymic  Affect: Congruent   Thought Process  Thought Processes: Coherent; Linear  Descriptions of Associations:Intact  Orientation:Full (Time, Place and Person)  Thought Content:Logical  Diagnosis of Schizophrenia or Schizoaffective disorder in past: Yes  Duration of Psychotic Symptoms: Greater than six months  Hallucinations:None  Ideas of Reference:None  Suicidal Thoughts:No Without Intent; Without Plan; Without Means to Carry Out; Without Access to Means (Contracted to safety, denied access to weapons or guns.)  Homicidal Thoughts:No   Sensorium  Memory: Immediate Fair; Recent Fair  Judgment: Fair  Insight: Fair   Executive Functions  Concentration: Good  Attention Span: Good  Recall: [employer]  of Knowledge: Good  Language: Good   Psychomotor  Activity  Psychomotor Activity: Normal   Assets  Assets: Communication Skills; Desire for Improvement; Physical Health; Resilience; Social Support   Sleep  Sleep: Good  Number of hours:  3     Physical Exam: Physical Exam Vitals and nursing note reviewed.  Cardiovascular:     Rate and Rhythm: Normal rate and regular rhythm.  Neurological:     Mental Status: He is alert and oriented to person, place, and time.  Psychiatric:        Mood and Affect: Mood normal.        Behavior: Behavior normal.    Review of Systems  Psychiatric/Behavioral:  Positive for depression. The patient is nervous/anxious.   All other systems reviewed and are negative.  Blood pressure 129/85, pulse (!) 118, temperature 97.6 F (36.4 C), temperature source Oral, resp. rate 18, SpO2 99 %. There is no height or weight on file to calculate BMI.  Musculoskeletal: Strength & Muscle Tone: within normal limits Gait & Station: normal Patient leans: N/A   [place] MSE Discharge Disposition for Follow up and Recommendations: Based on my evaluation the patient does not appear to have an emergency medical condition and can be discharged with resources and follow up care in outpatient services for Medication Management and Keep follow-up with ACTT services - This provider reorder home medications and was sent to [facility] pharmacy x 30days  [place], NP 01/14/2023, 1:56 PM
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
## 2023-02-12 ENCOUNTER — Other Ambulatory Visit (HOSPITAL_COMMUNITY): Payer: Self-pay | Admitting: Family
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
## 2023-03-20 ENCOUNTER — Encounter: Payer: Self-pay | Admitting: Physician Assistant

## 2023-03-20 ENCOUNTER — Ambulatory Visit: Payer: Medicare Other | Admitting: Physician Assistant

## 2023-03-20 VITALS — BP 137/102 | HR 114

## 2023-03-20 DIAGNOSIS — F250 Schizoaffective disorder, bipolar type: Secondary | ICD-10-CM

## 2023-03-20 DIAGNOSIS — E782 Mixed hyperlipidemia: Secondary | ICD-10-CM | POA: Diagnosis not present

## 2023-03-20 DIAGNOSIS — I10 Essential (primary) hypertension: Secondary | ICD-10-CM

## 2023-03-20 DIAGNOSIS — Z5901 Sheltered homelessness: Secondary | ICD-10-CM

## 2023-03-20 DIAGNOSIS — E559 Vitamin D deficiency, unspecified: Secondary | ICD-10-CM | POA: Diagnosis not present

## 2023-03-20 DIAGNOSIS — F319 Bipolar disorder, unspecified: Secondary | ICD-10-CM | POA: Diagnosis not present

## 2023-03-20 MED ORDER — CHOLECALCIFEROL 10 MCG (400 UNIT) PO TABS: 400.0000 [IU] | ORAL_TABLET | Freq: Every day | ORAL | 1 refills | Status: DC

## 2023-03-20 MED ORDER — SERTRALINE HCL 50 MG PO TABS: 50.0000 mg | ORAL_TABLET | Freq: Every day | ORAL | 1 refills | Status: DC

## 2023-03-20 MED ORDER — OXCARBAZEPINE 150 MG PO TABS: 150.0000 mg | ORAL_TABLET | Freq: Two times a day (BID) | ORAL | 1 refills | Status: DC

## 2023-03-20 MED ORDER — AMLODIPINE BESYLATE 10 MG PO TABS: 10.0000 mg | ORAL_TABLET | Freq: Every day | ORAL | 1 refills | Status: DC

## 2023-03-20 MED ORDER — ATORVASTATIN CALCIUM 40 MG PO TABS: 40.0000 mg | ORAL_TABLET | Freq: Every day | ORAL | 1 refills | Status: DC

## 2023-03-20 MED ORDER — OLANZAPINE 2.5 MG PO TABS: 2.5000 mg | ORAL_TABLET | Freq: Every day | ORAL | 1 refills | Status: DC
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
## 2023-03-20 NOTE — Patient Instructions (Addendum)
I sent a refill of your medications to your pharmacy.  I encourage you to check your blood pressure at home , keep a written log and have available for all office visits.  Please return to the mobile unit in 4 weeks for follow up.  Please be prepared to have fasting labs completed.  Kennieth Rad, PA-C Physician Assistant [place] [email or web address]   How to Take Your Blood Pressure Blood pressure is a measurement of how strongly your blood is pressing against the walls of your arteries. Arteries are blood vessels that carry blood from your heart throughout your body. Your health care provider takes your blood pressure at each office visit. You can also take your own blood pressure at home with a blood pressure monitor. You may need to take your own blood pressure to: Confirm a diagnosis of high blood pressure (hypertension). Monitor your blood pressure over time. Make sure your blood pressure medicine is working. Supplies needed: Blood pressure monitor. A chair to sit in. This should be a chair where you can sit upright with your back supported. Do not sit on a soft couch or an armchair. Table or desk. Small notebook and pencil or pen. How to prepare To get the most accurate reading, avoid the following for 30 minutes before you check your blood pressure: Drinking caffeine. Drinking alcohol. Eating. Smoking. Exercising. Five minutes before you check your blood pressure: Use the bathroom and urinate so that you have an empty bladder. Sit quietly in a chair. Do not talk. How to take your blood pressure To check your blood pressure, follow the instructions in the manual that came with your blood pressure monitor. If you have a digital blood pressure monitor, the instructions may be as follows: Sit up straight in a chair. Place your feet on the floor. Do not cross your ankles or legs. Rest your left arm at the level of your  heart on a table or desk or on the arm of a chair. Pull up your shirt sleeve. Wrap the blood pressure cuff around the upper part of your left arm, 1 inch (2.5 cm) above your elbow. It is best to wrap the cuff around bare skin. Fit the cuff snugly, but not too tightly, around your arm. You should be able to place only one finger between the cuff and your arm. Position the cord so that it rests in the bend of your elbow. Press the power button. Sit quietly while the cuff inflates and deflates. Read the digital reading on the monitor screen and write the numbers down (record them) in a notebook. Wait 2-3 minutes, then repeat the steps, starting at step 1. What does my blood pressure reading mean? A blood pressure reading consists of a higher number over a lower number. Ideally, your blood pressure should be below 120/80. The first ("top") number is called the systolic pressure. It is a measure of the pressure in your arteries as your heart beats. The second ("bottom") number is called the diastolic pressure. It is a measure of the pressure in your arteries as the heart relaxes. Blood pressure is classified into four stages. The following are the stages for adults who do not have a short-term serious illness or a chronic condition. Systolic pressure and diastolic pressure are measured in a unit called mm Hg (millimeters of mercury).  Normal Systolic pressure: below 123456. Diastolic pressure: below 80. Elevated Systolic pressure: [id number]. Diastolic pressure: below 80. Hypertension stage 1 Systolic pressure: 0000000. Diastolic  pressure: 80-89. Hypertension stage 2 Systolic pressure: [id number] or above. Diastolic pressure: 90 or above. You can have elevated blood pressure or hypertension even if only the systolic or only the diastolic number in your reading is higher than normal. Follow these instructions at home: Medicines Take over-the-counter and prescription medicines only as told by your health care  provider. Tell your health care provider if you are having any side effects from blood pressure medicine. General instructions Check your blood pressure as often as recommended by your health care provider. Check your blood pressure at the same time every day. Take your monitor to the next appointment with your health care provider to make sure that: You are using it correctly. It provides accurate readings. Understand what your goal blood pressure numbers are. Keep all follow-up visits. This is important. General tips Your health care provider can suggest a reliable monitor that will meet your needs. There are several types of home blood pressure monitors. Choose a monitor that has an arm cuff. Do not choose a monitor that measures your blood pressure from your wrist or finger. Choose a cuff that wraps snugly, not too tight or too loose, around your upper arm. You should be able to fit only one finger between your arm and the cuff. You can buy a blood pressure monitor at most drugstores or online. Where to find more information American Heart Association: www.heart.org Contact a health care provider if: Your blood pressure is consistently high. Your blood pressure is suddenly low. Get help right away if: Your systolic blood pressure is higher than 180. Your diastolic blood pressure is higher than 120. These symptoms may be an emergency. Get help right away. Call 911. Do not wait to see if the symptoms will go away. Do not drive yourself to the hospital. Summary Blood pressure is a measurement of how strongly your blood is pressing against the walls of your arteries. A blood pressure reading consists of a higher number over a lower number. Ideally, your blood pressure should be below 120/80. Check your blood pressure at the same time every day. Avoid caffeine, alcohol, smoking, and exercise for 30 minutes prior to checking your blood pressure. These agents can affect the accuracy of the  blood pressure reading. This information is not intended to replace advice given to you by your health care provider. Make sure you discuss any questions you have with your health care provider. Document Revised: 08/25/2021 Document Reviewed: 08/25/2021 Elsevier Patient Education  [place].

## 2023-04-02 ENCOUNTER — Ambulatory Visit
Admission: EM | Admit: 2023-04-02 | Discharge: 2023-04-02 | Disposition: A | Discharge status: Home / Self Care | Payer: Medicare Other

## 2023-04-02 DIAGNOSIS — I10 Essential (primary) hypertension: Secondary | ICD-10-CM
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
## 2023-04-02 NOTE — Discharge Instructions (Addendum)
For diabetes or elevated blood sugar, please make sure you are limiting and avoiding starchy, carbohydrate foods like pasta, breads, sweet breads, pastry, rice, potatoes, desserts. These foods can elevate your blood sugar. Also, limit and avoid drinks that contain a lot of sugar such as sodas, sweet teas, fruit juices.  Drinking plain water will be much more helpful, try 64 ounces of water daily.  It is okay to flavor your water naturally by cutting cucumber, lemon, mint or lime, placing it in a picture with water and drinking it over a period of 24-48 hours as long as it remains refrigerated.  For elevated blood pressure, make sure you are monitoring salt in your diet.  Do not eat restaurant foods and limit processed foods at home. I highly recommend you prepare and cook your own foods at home.  Processed foods include things like frozen meals, pre-seasoned meats and dinners, deli meats, canned foods as these foods contain a high amount of sodium/salt.  Make sure you are paying attention to sodium labels on foods you buy at the grocery store. Buy your spices separately such as garlic powder, onion powder, cumin, cayenne, parsley flakes so that you can avoid seasonings that contain salt. However, salt-free seasonings are available and can be used, an example is Mrs. Dash and includes a lot of different mixtures that do not contain salt.  Lastly, when cooking using oils that are healthier for you is important. This includes olive oil, avocado oil, canola oil. We have discussed a lot of foods to avoid but below is a list of foods that can be very healthy to use in your diet whether it is for diabetes, cholesterol, high blood pressure, or in general healthy eating.  Salads - kale, spinach, cabbage, spring mix, arugula Fruits - avocadoes, berries (blueberries, raspberries, blackberries), apples, oranges, pomegranate, grapefruit, kiwi Vegetables - asparagus, cauliflower, broccoli, green beans, brussel sprouts,  bell peppers, beets; stay away from or limit starchy vegetables like potatoes, carrots, peas Other general foods - kidney beans, egg whites, almonds, walnuts, sunflower seeds, pumpkin seeds, fat free yogurt, almond milk, flax seeds, quinoa, oats  Meat - It is better to eat lean meats and limit your red meat including pork to once a week.  Wild caught fish, chicken breast are good options as they tend to be leaner sources of good protein. Still be mindful of the sodium labels for the meats you buy.  DO NOT EAT ANY FOODS ON THIS LIST THAT YOU ARE ALLERGIC TO. For more specific needs, I highly recommend consulting a dietician or nutritionist but this can definitely be a good starting point.  

## 2023-04-02 NOTE — ED Triage Notes (Signed)
Per family, pt has high blood pressure 173/93 at home this week.

## 2023-04-17 ENCOUNTER — Ambulatory Visit: Payer: Medicare Other | Admitting: Physician Assistant

## 2023-04-17 VITALS — BP 130/90 | HR 88 | Ht 66.0 in | Wt 141.0 lb

## 2023-04-17 DIAGNOSIS — F5104 Psychophysiologic insomnia: Secondary | ICD-10-CM | POA: Diagnosis not present

## 2023-04-17 DIAGNOSIS — Z1159 Encounter for screening for other viral diseases: Secondary | ICD-10-CM

## 2023-04-17 DIAGNOSIS — E782 Mixed hyperlipidemia: Secondary | ICD-10-CM

## 2023-04-17 DIAGNOSIS — E538 Deficiency of other specified B group vitamins: Secondary | ICD-10-CM

## 2023-04-17 DIAGNOSIS — F319 Bipolar disorder, unspecified: Secondary | ICD-10-CM

## 2023-04-17 DIAGNOSIS — E559 Vitamin D deficiency, unspecified: Secondary | ICD-10-CM | POA: Diagnosis not present

## 2023-04-17 DIAGNOSIS — I10 Essential (primary) hypertension: Secondary | ICD-10-CM | POA: Diagnosis not present

## 2023-04-17 DIAGNOSIS — Z125 Encounter for screening for malignant neoplasm of prostate: Secondary | ICD-10-CM

## 2023-04-17 MED ORDER — OLANZAPINE 2.5 MG PO TABS: 2.5000 mg | ORAL_TABLET | Freq: Every day | ORAL | 1 refills | Status: DC

## 2023-04-17 MED ORDER — TRAZODONE HCL 50 MG PO TABS: 50.0000 mg | ORAL_TABLET | Freq: Every day | ORAL | 1 refills | Status: DC

## 2023-04-17 MED ORDER — SERTRALINE HCL 50 MG PO TABS: 50.0000 mg | ORAL_TABLET | Freq: Every day | ORAL | 1 refills | Status: DC

## 2023-04-17 MED ORDER — OXCARBAZEPINE 150 MG PO TABS: 150.0000 mg | ORAL_TABLET | Freq: Two times a day (BID) | ORAL | 1 refills | Status: DC

## 2023-04-17 MED ORDER — CHOLECALCIFEROL 10 MCG (400 UNIT) PO TABS: 400.0000 [IU] | ORAL_TABLET | Freq: Every day | ORAL | 1 refills | Status: DC

## 2023-04-17 MED ORDER — ATORVASTATIN CALCIUM 40 MG PO TABS: 40.0000 mg | ORAL_TABLET | Freq: Every day | ORAL | 1 refills | Status: DC

## 2023-04-17 MED ORDER — AMLODIPINE BESYLATE 10 MG PO TABS: 10.0000 mg | ORAL_TABLET | Freq: Every day | ORAL | 1 refills | Status: DC

## 2023-04-17 NOTE — Patient Instructions (Signed)
To help with sleep, I encourage you to do a trial of trazodone.  You can take 1/2-1 full tablet prior to bedtime.  You should be sleeping 7 to 8 hours a night.  I do encourage you to increase your water intake, you should drink at least 64 ounces of water a day.  We will call you with today's lab results.  Your psychiatry referral was sent to Dr. Morrie Sheldon and is on 6/6.  I will reach out to our referral coordinator for more information.  Roney Jaffe, PA-C Physician Assistant [facility] [email or web address]
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
## 2023-04-18 ENCOUNTER — Encounter: Payer: Self-pay | Admitting: Physician Assistant

## 2023-04-18 DIAGNOSIS — F5104 Psychophysiologic insomnia: Secondary | ICD-10-CM | POA: Insufficient documentation

## 2023-04-18 DIAGNOSIS — E559 Vitamin D deficiency, unspecified: Secondary | ICD-10-CM | POA: Insufficient documentation

## 2023-04-18 DIAGNOSIS — E538 Deficiency of other specified B group vitamins: Secondary | ICD-10-CM | POA: Insufficient documentation

## 2023-04-26 LAB — COMP. METABOLIC PANEL (12)
AST: 19 IU/L (ref 0–40)
Albumin/Globulin Ratio: 1.8 (ref 1.2–2.2)
Albumin: 4.7 g/dL (ref 3.9–4.9)
Alkaline Phosphatase: 123 IU/L — ABNORMAL HIGH (ref 44–121)
BUN/Creatinine Ratio: 18 (ref 10–24)
BUN: 19 mg/dL (ref 8–27)
Bilirubin Total: 0.3 mg/dL (ref 0.0–1.2)
Calcium: 10.2 mg/dL (ref 8.6–10.2)
Chloride: 103 mmol/L (ref 96–106)
Creatinine, Ser: 1.06 mg/dL (ref 0.76–1.27)
Globulin, Total: 2.6 g/dL (ref 1.5–4.5)
Glucose: 98 mg/dL (ref 70–99)
Potassium: 4.3 mmol/L (ref 3.5–5.2)
Sodium: 144 mmol/L (ref 134–144)
Total Protein: 7.3 g/dL (ref 6.0–8.5)
eGFR: 78 mL/min/{1.73_m2} (ref >59)

## 2023-04-26 LAB — CBC WITH DIFFERENTIAL/PLATELET
Basophils Absolute: 0.1 10*3/uL (ref 0.0–0.2)
Basos: 1 %
EOS (ABSOLUTE): 0.2 10*3/uL (ref 0.0–0.4)
Eos: 2 %
Hematocrit: 53.2 % — ABNORMAL HIGH (ref 37.5–51.0)
Hemoglobin: 17.9 g/dL — ABNORMAL HIGH (ref 13.0–17.7)
Immature Grans (Abs): 0.1 10*3/uL (ref 0.0–0.1)
Immature Granulocytes: 1 %
Lymphocytes Absolute: 2.9 10*3/uL (ref 0.7–3.1)
Lymphs: 27 %
MCH: 31.1 pg (ref 26.6–33.0)
MCHC: 33.6 g/dL (ref 31.5–35.7)
MCV: 93 fL (ref 79–97)
Monocytes Absolute: 0.8 10*3/uL (ref 0.1–0.9)
Monocytes: 7 %
Neutrophils Absolute: 6.8 10*3/uL (ref 1.4–7.0)
Neutrophils: 62 %
Platelets: 415 10*3/uL (ref 150–450)
RBC: 5.75 x10E6/uL (ref 4.14–5.80)
RDW: 12.5 % (ref 11.6–15.4)
WBC: 10.8 10*3/uL (ref 3.4–10.8)

## 2023-04-26 LAB — LIPID PANEL
Chol/HDL Ratio: 6.1 ratio — ABNORMAL HIGH (ref 0.0–5.0)
Cholesterol, Total: 236 mg/dL — ABNORMAL HIGH (ref 100–199)
HDL: 39 mg/dL — ABNORMAL LOW (ref >39)
LDL Chol Calc (NIH): 170 mg/dL — ABNORMAL HIGH (ref 0–99)
Triglycerides: 149 mg/dL (ref 0–149)
VLDL Cholesterol Cal: 27 mg/dL (ref 5–40)

## 2023-04-26 LAB — TSH: TSH: 3.18 u[IU]/mL (ref 0.450–4.500)

## 2023-04-26 LAB — HCV INTERPRETATION

## 2023-04-26 LAB — VITAMIN D 25 HYDROXY (VIT D DEFICIENCY, FRACTURES): Vit D, 25-Hydroxy: 19 ng/mL — ABNORMAL LOW (ref 30.0–100.0)

## 2023-04-26 LAB — HCV AB W REFLEX TO QUANT PCR: HCV Ab: NONREACTIVE

## 2023-04-26 LAB — VITAMIN B12

## 2023-05-10 ENCOUNTER — Other Ambulatory Visit: Payer: Self-pay | Admitting: Physician Assistant

## 2023-05-10 DIAGNOSIS — E559 Vitamin D deficiency, unspecified: Secondary | ICD-10-CM

## 2023-05-10 DIAGNOSIS — F5104 Psychophysiologic insomnia: Secondary | ICD-10-CM

## 2023-05-10 NOTE — Telephone Encounter (Signed)
Mobile unit provider Requested Prescriptions  Pending Prescriptions Disp Refills   traZODone (DESYREL) 50 MG tablet [Pharmacy Med Name: TRAZODONE 50 MG TABLET] 90 tablet 1    Sig: TAKE 1 TABLET BY MOUTH EVERYDAY AT BEDTIME     There is no refill protocol information for this order     Cholecalciferol (VITAMIN D3) 10 MCG (400 UNIT) tablet [Pharmacy Med Name: VITAMIN D3 400 UNIT TABLET] 90 tablet 1    Sig: TAKE 1 TABLET BY MOUTH DAILY.     There is no refill protocol information for this order

## 2023-05-14 ENCOUNTER — Other Ambulatory Visit: Payer: Self-pay | Admitting: Physician Assistant

## 2023-05-14 DIAGNOSIS — E782 Mixed hyperlipidemia: Secondary | ICD-10-CM

## 2023-05-14 DIAGNOSIS — I10 Essential (primary) hypertension: Secondary | ICD-10-CM

## 2023-05-14 DIAGNOSIS — F319 Bipolar disorder, unspecified: Secondary | ICD-10-CM

## 2023-05-15 NOTE — Telephone Encounter (Signed)
Requested Prescriptions  Pending Prescriptions Disp Refills   OLANZapine (ZYPREXA) 2.5 MG tablet [Pharmacy Med Name: OLANZAPINE 2.5 MG TABLET] 30 tablet 1    Sig: TAKE 1 TABLET EACH NIGHT AT BEDTIME.     There is no refill protocol information for this order     OXcarbazepine (TRILEPTAL) 150 MG tablet [Pharmacy Med Name: OXCARBAZEPINE 150 MG TABLET] 60 tablet 1    Sig: TAKE 1 TABLET BY MOUTH TWICE A DAY     There is no refill protocol information for this order     atorvastatin (LIPITOR) 40 MG tablet [Pharmacy Med Name: ATORVASTATIN 40 MG TABLET] 30 tablet 1    Sig: TAKE 1 TABLET BY MOUTH EVERY NIGHT AT BEDTIME.     There is no refill protocol information for this order     amLODipine (NORVASC) 10 MG tablet [Pharmacy Med Name: AMLODIPINE BESYLATE 10 MG TAB] 30 tablet 1    Sig: TAKE 1 TABLET BY MOUTH EVERY DAY     There is no refill protocol information for this order     sertraline (ZOLOFT) 50 MG tablet [Pharmacy Med Name: SERTRALINE HCL 50 MG TABLET] 30 tablet 1    Sig: TAKE 1 TABLET BY MOUTH EVERY DAY     There is no refill protocol information for this order

## 2023-05-30 ENCOUNTER — Encounter: Payer: Self-pay | Admitting: Family Medicine

## 2023-05-30 ENCOUNTER — Ambulatory Visit: Payer: Medicare Other | Attending: Family Medicine | Admitting: Family Medicine

## 2023-05-30 VITALS — BP 153/96 | HR 112 | Ht 66.0 in

## 2023-05-30 DIAGNOSIS — M549 Dorsalgia, unspecified: Secondary | ICD-10-CM

## 2023-05-30 DIAGNOSIS — E782 Mixed hyperlipidemia: Secondary | ICD-10-CM

## 2023-05-30 DIAGNOSIS — I10 Essential (primary) hypertension: Secondary | ICD-10-CM | POA: Diagnosis not present

## 2023-05-30 DIAGNOSIS — F3164 Bipolar disorder, current episode mixed, severe, with psychotic features: Secondary | ICD-10-CM | POA: Diagnosis not present

## 2023-05-30 DIAGNOSIS — G629 Polyneuropathy, unspecified: Secondary | ICD-10-CM

## 2023-05-30 DIAGNOSIS — R251 Tremor, unspecified: Secondary | ICD-10-CM

## 2023-05-30 MED ORDER — TIZANIDINE HCL 4 MG PO TABS: 4.0000 mg | ORAL_TABLET | Freq: Three times a day (TID) | ORAL | 2 refills | Status: DC | PRN

## 2023-05-30 MED ORDER — GABAPENTIN 300 MG PO CAPS: 300.0000 mg | ORAL_CAPSULE | Freq: Every day | ORAL | 1 refills | Status: DC

## 2023-05-30 NOTE — Progress Notes (Signed)
Back pain Tremors.
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
## 2023-05-30 NOTE — Patient Instructions (Signed)

## 2023-05-31 ENCOUNTER — Encounter (HOSPITAL_COMMUNITY): Payer: Self-pay | Admitting: Student in an Organized Health Care Education/Training Program

## 2023-05-31 ENCOUNTER — Ambulatory Visit (HOSPITAL_BASED_OUTPATIENT_CLINIC_OR_DEPARTMENT_OTHER): Payer: Medicare Other | Admitting: Student in an Organized Health Care Education/Training Program

## 2023-05-31 ENCOUNTER — Ambulatory Visit
Admission: RE | Admit: 2023-05-31 | Discharge: 2023-05-31 | Disposition: A | Discharge status: Home / Self Care | Payer: Medicare Other | Source: Ambulatory Visit | Attending: Family Medicine | Admitting: Family Medicine

## 2023-05-31 VITALS — BP 129/97 | HR 110 | Resp 110 | Wt 160.0 lb

## 2023-05-31 DIAGNOSIS — F4010 Social phobia, unspecified: Secondary | ICD-10-CM | POA: Insufficient documentation

## 2023-05-31 DIAGNOSIS — M549 Dorsalgia, unspecified: Secondary | ICD-10-CM

## 2023-05-31 DIAGNOSIS — F319 Bipolar disorder, unspecified: Secondary | ICD-10-CM

## 2023-05-31 MED ORDER — OLANZAPINE 5 MG PO TABS: 5.0000 mg | ORAL_TABLET | Freq: Every day | ORAL | 3 refills | Status: DC

## 2023-05-31 MED ORDER — SERTRALINE HCL 50 MG PO TABS: 50.0000 mg | ORAL_TABLET | Freq: Every day | ORAL | 2 refills | Status: DC

## 2023-05-31 MED ORDER — OXCARBAZEPINE 150 MG PO TABS: 150.0000 mg | ORAL_TABLET | Freq: Two times a day (BID) | ORAL | 2 refills | Status: DC
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
## 2023-06-05 ENCOUNTER — Telehealth: Payer: Self-pay | Admitting: Family Medicine

## 2023-06-05 NOTE — Telephone Encounter (Signed)
Called to schedule pt for lab appt, no answer. LVM for pt to give a call back.

## 2023-06-05 NOTE — Telephone Encounter (Signed)
Mr. Pettengill brother returned call to schedule Stephen Delacruz for labs with [facility]. Annette Stable (brother) provided number to reach their sister (Stephen Delacruz) to schedule.Call was placed to sister Stephen Delacruz, unable to reach VM left to call back.

## 2023-06-18 ENCOUNTER — Other Ambulatory Visit: Payer: Self-pay | Admitting: Physician Assistant

## 2023-06-18 DIAGNOSIS — F5104 Psychophysiologic insomnia: Secondary | ICD-10-CM

## 2023-07-12 ENCOUNTER — Other Ambulatory Visit: Payer: Self-pay | Admitting: Family Medicine

## 2023-07-12 DIAGNOSIS — F5104 Psychophysiologic insomnia: Secondary | ICD-10-CM

## 2023-07-16 ENCOUNTER — Other Ambulatory Visit: Payer: Self-pay | Admitting: Physician Assistant

## 2023-07-16 DIAGNOSIS — I10 Essential (primary) hypertension: Secondary | ICD-10-CM

## 2023-07-16 DIAGNOSIS — E782 Mixed hyperlipidemia: Secondary | ICD-10-CM

## 2023-07-16 DIAGNOSIS — E559 Vitamin D deficiency, unspecified: Secondary | ICD-10-CM

## 2023-07-27 ENCOUNTER — Other Ambulatory Visit: Payer: Self-pay | Admitting: Family Medicine

## 2023-07-27 DIAGNOSIS — F5104 Psychophysiologic insomnia: Secondary | ICD-10-CM

## 2023-08-07 ENCOUNTER — Ambulatory Visit: Payer: Medicare Other | Attending: Family Medicine

## 2023-08-07 VITALS — Ht 66.0 in | Wt 160.0 lb

## 2023-08-07 DIAGNOSIS — Z0000 Encounter for general adult medical examination without abnormal findings: Secondary | ICD-10-CM
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
## 2023-08-07 NOTE — Patient Instructions (Signed)
Stephen Delacruz , Thank you for taking time to come for your Medicare Wellness Visit. I appreciate your ongoing commitment to your health goals. Please review the following plan we discussed and let me know if I can assist you in the future.   Referrals/Orders/Follow-Ups/Clinician Recommendations: Aim for 30 minutes of exercise or brisk walking, 6-8 glasses of water, and 5 servings of fruits and vegetables each day.   This is a list of the screening recommended for you and due dates:  Health Maintenance  Topic Date Due   Colon Cancer Screening  Never done   Zoster (Shingles) Vaccine (1 of 2) Never done   COVID-19 Vaccine (1 - 2023-24 season) Never done   Flu Shot  07/26/2023   Medicare Annual Wellness Visit  08/06/2024   DTaP/Tdap/Td vaccine (3 - Td or Tdap) 07/19/2032   Hepatitis C Screening  Completed   HIV Screening  Completed   HPV Vaccine  Aged Out    Advanced directives: (ACP Link)Information on Advanced Care Planning can be found at [facility] Advance Health Care Directives Advance Health Care Directives ([email or web address]   Next Medicare Annual Wellness Visit scheduled for next year: Yes  Preventive Care 40-64 Years, Male Preventive care refers to lifestyle choices and visits with your health care provider that can promote health and wellness. What does preventive care include? A yearly physical exam. This is also called an annual well check. Dental exams once or twice a year. Routine eye exams. Ask your health care provider how often you should have your eyes checked. Personal lifestyle choices, including: Daily care of your teeth and gums. Regular physical activity. Eating a healthy diet. Avoiding tobacco and drug use. Limiting alcohol use. Practicing safe sex. Taking low-dose aspirin every day starting at age 81. What happens during an annual well check? The services and screenings done by your health care provider during your annual well check will  depend on your age, overall health, lifestyle risk factors, and family history of disease. Counseling  Your health care provider may ask you questions about your: Alcohol use. Tobacco use. Drug use. Emotional well-being. Home and relationship well-being. Sexual activity. Eating habits. Work and work Astronomer. Screening  You may have the following tests or measurements: Height, weight, and BMI. Blood pressure. Lipid and cholesterol levels. These may be checked every 5 years, or more frequently if you are over 59 years old. Skin check. Lung cancer screening. You may have this screening every year starting at age 8 if you have a 30-pack-year history of smoking and currently smoke or have quit within the past 15 years. Fecal occult blood test (FOBT) of the stool. You may have this test every year starting at age 45. Flexible sigmoidoscopy or colonoscopy. You may have a sigmoidoscopy every 5 years or a colonoscopy every 10 years starting at age 67. Prostate cancer screening. Recommendations will vary depending on your family history and other risks. Hepatitis C blood test. Hepatitis B blood test. Sexually transmitted disease (STD) testing. Diabetes screening. This is done by checking your blood sugar (glucose) after you have not eaten for a while (fasting). You may have this done every 1-3 years. Discuss your test results, treatment options, and if necessary, the need for more tests with your health care provider. Vaccines  Your health care provider may recommend certain vaccines, such as: Influenza vaccine. This is recommended every year. Tetanus, diphtheria, and acellular pertussis (Tdap, Td) vaccine. You may need a Td booster every 10 years.  Zoster vaccine. You may need this after age 69. Pneumococcal 13-valent conjugate (PCV13) vaccine. You may need this if you have certain conditions and have not been vaccinated. Pneumococcal polysaccharide (PPSV23) vaccine. You may need one or  two doses if you smoke cigarettes or if you have certain conditions. Talk to your health care provider about which screenings and vaccines you need and how often you need them. This information is not intended to replace advice given to you by your health care provider. Make sure you discuss any questions you have with your health care provider. Document Released: 01/07/2016 Document Revised: 08/30/2016 Document Reviewed: 10/12/2015 Elsevier Interactive Patient Education  2017 [employer].  Fall Prevention in the Home Falls can cause injuries. They can happen to people of all ages. There are many things you can do to make your home safe and to help prevent falls. What can I do on the outside of my home? Regularly fix the edges of walkways and driveways and fix any cracks. Remove anything that might make you trip as you walk through a door, such as a raised step or threshold. Trim any bushes or trees on the path to your home. Use bright outdoor lighting. Clear any walking paths of anything that might make someone trip, such as rocks or tools. Regularly check to see if handrails are loose or broken. Make sure that both sides of any steps have handrails. Any raised decks and porches should have guardrails on the edges. Have any leaves, snow, or ice cleared regularly. Use sand or salt on walking paths during winter. Clean up any spills in your garage right away. This includes oil or grease spills. What can I do in the bathroom? Use night lights. Install grab bars by the toilet and in the tub and shower. Do not use towel bars as grab bars. Use non-skid mats or decals in the tub or shower. If you need to sit down in the shower, use a plastic, non-slip stool. Keep the floor dry. Clean up any water that spills on the floor as soon as it happens. Remove soap buildup in the tub or shower regularly. Attach bath mats securely with double-sided non-slip rug tape. Do not have throw rugs and other  things on the floor that can make you trip. What can I do in the bedroom? Use night lights. Make sure that you have a light by your bed that is easy to reach. Do not use any sheets or blankets that are too big for your bed. They should not hang down onto the floor. Have a firm chair that has side arms. You can use this for support while you get dressed. Do not have throw rugs and other things on the floor that can make you trip. What can I do in the kitchen? Clean up any spills right away. Avoid walking on wet floors. Keep items that you use a lot in easy-to-reach places. If you need to reach something above you, use a strong step stool that has a grab bar. Keep electrical cords out of the way. Do not use floor polish or wax that makes floors slippery. If you must use wax, use non-skid floor wax. Do not have throw rugs and other things on the floor that can make you trip. What can I do with my stairs? Do not leave any items on the stairs. Make sure that there are handrails on both sides of the stairs and use them. Fix handrails that are broken or loose.  Make sure that handrails are as long as the stairways. Check any carpeting to make sure that it is firmly attached to the stairs. Fix any carpet that is loose or worn. Avoid having throw rugs at the top or bottom of the stairs. If you do have throw rugs, attach them to the floor with carpet tape. Make sure that you have a light switch at the top of the stairs and the bottom of the stairs. If you do not have them, ask someone to add them for you. What else can I do to help prevent falls? Wear shoes that: Do not have high heels. Have rubber bottoms. Are comfortable and fit you well. Are closed at the toe. Do not wear sandals. If you use a stepladder: Make sure that it is fully opened. Do not climb a closed stepladder. Make sure that both sides of the stepladder are locked into place. Ask someone to hold it for you, if possible. Clearly  mark and make sure that you can see: Any grab bars or handrails. First and last steps. Where the edge of each step is. Use tools that help you move around (mobility aids) if they are needed. These include: Canes. Walkers. Scooters. Crutches. Turn on the lights when you go into a dark area. Replace any light bulbs as soon as they burn out. Set up your furniture so you have a clear path. Avoid moving your furniture around. If any of your floors are uneven, fix them. If there are any pets around you, be aware of where they are. Review your medicines with your doctor. Some medicines can make you feel dizzy. This can increase your chance of falling. Ask your doctor what other things that you can do to help prevent falls. This information is not intended to replace advice given to you by your health care provider. Make sure you discuss any questions you have with your health care provider. Document Released: 10/07/2009 Document Revised: 05/18/2016 Document Reviewed: 01/15/2015 Elsevier Interactive Patient Education  2017 [employer].

## 2023-08-09 ENCOUNTER — Other Ambulatory Visit: Payer: Self-pay | Admitting: Family Medicine

## 2023-08-09 DIAGNOSIS — E782 Mixed hyperlipidemia: Secondary | ICD-10-CM

## 2023-08-09 DIAGNOSIS — I10 Essential (primary) hypertension: Secondary | ICD-10-CM

## 2023-08-10 ENCOUNTER — Other Ambulatory Visit: Payer: Self-pay | Admitting: Family Medicine

## 2023-08-10 DIAGNOSIS — M549 Dorsalgia, unspecified: Secondary | ICD-10-CM

## 2023-08-13 ENCOUNTER — Ambulatory Visit (HOSPITAL_BASED_OUTPATIENT_CLINIC_OR_DEPARTMENT_OTHER): Payer: Medicare Other | Admitting: Student

## 2023-08-13 VITALS — BP 140/96 | HR 100 | Ht 66.0 in | Wt 164.0 lb

## 2023-08-13 DIAGNOSIS — I10 Essential (primary) hypertension: Secondary | ICD-10-CM | POA: Diagnosis not present

## 2023-08-13 DIAGNOSIS — F1211 Cannabis abuse, in remission: Secondary | ICD-10-CM

## 2023-08-13 DIAGNOSIS — F319 Bipolar disorder, unspecified: Secondary | ICD-10-CM

## 2023-08-13 DIAGNOSIS — F1021 Alcohol dependence, in remission: Secondary | ICD-10-CM | POA: Insufficient documentation

## 2023-08-13 DIAGNOSIS — F1011 Alcohol abuse, in remission: Secondary | ICD-10-CM

## 2023-08-13 DIAGNOSIS — F5104 Psychophysiologic insomnia: Secondary | ICD-10-CM | POA: Diagnosis not present

## 2023-08-13 DIAGNOSIS — Z79899 Other long term (current) drug therapy: Secondary | ICD-10-CM | POA: Diagnosis not present

## 2023-08-13 MED ORDER — TRAZODONE HCL 50 MG PO TABS
50.0000 mg | ORAL_TABLET | Freq: Every evening | ORAL | 2 refills | Status: DC | PRN
Start: 2023-08-13 — End: 2023-10-24

## 2023-08-13 MED ORDER — OXCARBAZEPINE 150 MG PO TABS
150.0000 mg | ORAL_TABLET | Freq: Two times a day (BID) | ORAL | 2 refills | Status: DC
Start: 2023-08-13 — End: 2023-10-24

## 2023-08-13 MED ORDER — SERTRALINE HCL 50 MG PO TABS
50.0000 mg | ORAL_TABLET | Freq: Every day | ORAL | 2 refills | Status: DC
Start: 2023-08-13 — End: 2023-10-24

## 2023-08-13 MED ORDER — OLANZAPINE 5 MG PO TABS: 5.0000 mg | ORAL_TABLET | Freq: Every day | ORAL | 2 refills | Status: DC
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
## 2023-08-13 NOTE — Addendum Note (Signed)
Addended by: Everlena Cooper on: 08/13/2023 02:24 PM   Modules accepted: Level of Service

## 2023-09-02 ENCOUNTER — Other Ambulatory Visit: Payer: Self-pay | Admitting: Family Medicine

## 2023-09-02 DIAGNOSIS — M549 Dorsalgia, unspecified: Secondary | ICD-10-CM

## 2023-09-03 ENCOUNTER — Other Ambulatory Visit: Payer: Self-pay

## 2023-09-03 ENCOUNTER — Encounter: Payer: Self-pay | Admitting: Family Medicine

## 2023-09-03 ENCOUNTER — Ambulatory Visit: Payer: Medicare Other | Attending: Family Medicine | Admitting: Family Medicine

## 2023-09-03 VITALS — BP 161/100 | HR 83 | Ht 66.0 in | Wt 161.2 lb

## 2023-09-03 DIAGNOSIS — F3164 Bipolar disorder, current episode mixed, severe, with psychotic features: Secondary | ICD-10-CM | POA: Diagnosis not present

## 2023-09-03 DIAGNOSIS — Z79899 Other long term (current) drug therapy: Secondary | ICD-10-CM | POA: Diagnosis not present

## 2023-09-03 DIAGNOSIS — F319 Bipolar disorder, unspecified: Secondary | ICD-10-CM | POA: Insufficient documentation

## 2023-09-03 DIAGNOSIS — I10 Essential (primary) hypertension: Secondary | ICD-10-CM

## 2023-09-03 DIAGNOSIS — Z131 Encounter for screening for diabetes mellitus: Secondary | ICD-10-CM

## 2023-09-03 DIAGNOSIS — Z87891 Personal history of nicotine dependence: Secondary | ICD-10-CM | POA: Diagnosis not present

## 2023-09-03 LAB — POCT GLYCOSYLATED HEMOGLOBIN (HGB A1C): HbA1c, POC (controlled diabetic range): 5.5 % (ref 0.0–7.0)

## 2023-09-03 MED ORDER — CARVEDILOL 3.125 MG PO TABS: 3.1250 mg | ORAL_TABLET | Freq: Two times a day (BID) | ORAL | 1 refills | Status: DC

## 2023-09-03 MED ORDER — AMLODIPINE BESYLATE 10 MG PO TABS
10.0000 mg | ORAL_TABLET | Freq: Every day | ORAL | 1 refills | Status: DC
Start: 2023-09-03 — End: 2023-12-04
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
## 2023-09-03 NOTE — Patient Instructions (Signed)

## 2023-09-17 ENCOUNTER — Ambulatory Visit: Payer: Medicare Other | Attending: Family Medicine

## 2023-09-17 VITALS — BP 131/75 | HR 76

## 2023-09-17 DIAGNOSIS — I10 Essential (primary) hypertension: Secondary | ICD-10-CM | POA: Diagnosis not present

## 2023-09-17 DIAGNOSIS — Z0130 Encounter for examination of blood pressure without abnormal findings: Secondary | ICD-10-CM | POA: Insufficient documentation

## 2023-09-17 DIAGNOSIS — Z79899 Other long term (current) drug therapy: Secondary | ICD-10-CM | POA: Diagnosis not present

## 2023-09-17 NOTE — Progress Notes (Signed)
Blood Pressure Recheck Visit  Name: Stephen Delacruz MRN: [id number] Date of Birth: 1956-06-07  Salaam Ceresa presents today for Blood Pressure recheck with clinical support staff.  Order for BP recheck by Elsie Lincoln, RN , ordered on 09/17/2023.   BP Readings from Last 3 Encounters:  09/17/23 131/75  09/03/23 (!) 161/100  05/30/23 (!) 153/96    Current Outpatient Medications  Medication Sig Dispense Refill   amLODipine (NORVASC) 10 MG tablet Take 1 tablet (10 mg total) by mouth daily. 90 tablet 1   atorvastatin (LIPITOR) 40 MG tablet TAKE 1 TABLET BY MOUTH EVERYDAY AT BEDTIME 90 tablet 0   carvedilol (COREG) 3.125 MG tablet Take 1 tablet (3.125 mg total) by mouth 2 (two) times daily with a meal. 180 tablet 1   Cholecalciferol (VITAMIN D3) 10 MCG (400 UNIT) tablet TAKE 1 TABLET BY MOUTH DAILY. 30 tablet 1   gabapentin (NEURONTIN) 300 MG capsule Take 1 capsule (300 mg total) by mouth at bedtime. 90 capsule 1   OLANZapine (ZYPREXA) 5 MG tablet Take 1 tablet (5 mg total) by mouth at bedtime. 30 tablet 2   OXcarbazepine (TRILEPTAL) 150 MG tablet Take 1 tablet (150 mg total) by mouth 2 (two) times daily. 60 tablet 2   sertraline (ZOLOFT) 50 MG tablet Take 1 tablet (50 mg total) by mouth daily. 30 tablet 2   tiZANidine (ZANAFLEX) 4 MG tablet TAKE 1 TABLET BY MOUTH EVERY 8 HOURS AS NEEDED 270 tablet 1   traZODone (DESYREL) 50 MG tablet Take 1 tablet (50 mg total) by mouth at bedtime as needed for sleep. 30 tablet 2   No current facility-administered medications for this visit.    Hypertensive Medication Review: Patient states that they are taking all their hypertensive medications as prescribed and their last dose of hypertensive medications was this morning   Documentation of any medication adherence discrepancies: none  Provider Recommendation:  Spoke to Dr. Alvis Lemmings and they stated:  Continue with current medication as order as BP in not > 140/90  Patient has been scheduled to  follow up with PCP 12/04/2023   Patient has been given provider's recommendations and does not have any questions or concerns at this time. Patient will contact the office for any future questions or concerns.

## 2023-10-08 ENCOUNTER — Ambulatory Visit (HOSPITAL_COMMUNITY): Payer: Medicare Other | Admitting: Student

## 2023-10-24 ENCOUNTER — Ambulatory Visit (HOSPITAL_BASED_OUTPATIENT_CLINIC_OR_DEPARTMENT_OTHER): Payer: Medicare Other | Admitting: Student

## 2023-10-24 VITALS — BP 157/112 | HR 67 | Ht 66.0 in | Wt 168.0 lb

## 2023-10-24 DIAGNOSIS — F5104 Psychophysiologic insomnia: Secondary | ICD-10-CM | POA: Diagnosis not present

## 2023-10-24 DIAGNOSIS — F1021 Alcohol dependence, in remission: Secondary | ICD-10-CM

## 2023-10-24 DIAGNOSIS — F319 Bipolar disorder, unspecified: Secondary | ICD-10-CM | POA: Diagnosis not present

## 2023-10-24 DIAGNOSIS — F1991 Other psychoactive substance use, unspecified, in remission: Secondary | ICD-10-CM | POA: Diagnosis not present

## 2023-10-24 MED ORDER — OLANZAPINE 5 MG PO TABS: 5.0000 mg | ORAL_TABLET | Freq: Every day | ORAL | 2 refills | Status: DC

## 2023-10-24 MED ORDER — TRAZODONE HCL 50 MG PO TABS: 50.0000 mg | ORAL_TABLET | Freq: Every evening | ORAL | 2 refills | Status: DC | PRN

## 2023-10-24 MED ORDER — OXCARBAZEPINE 150 MG PO TABS: 150.0000 mg | ORAL_TABLET | Freq: Two times a day (BID) | ORAL | 2 refills | Status: DC

## 2023-10-24 MED ORDER — SERTRALINE HCL 50 MG PO TABS: 50.0000 mg | ORAL_TABLET | Freq: Every day | ORAL | 2 refills | Status: DC

## 2023-10-26 DIAGNOSIS — F1991 Other psychoactive substance use, unspecified, in remission: Secondary | ICD-10-CM | POA: Insufficient documentation

## 2023-10-26 NOTE — Addendum Note (Signed)
Addended by: Everlena Cooper on: 10/26/2023 10:23 AM   Modules accepted: Level of Service
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
## 2023-11-03 ENCOUNTER — Other Ambulatory Visit: Payer: Self-pay | Admitting: Family Medicine

## 2023-11-03 DIAGNOSIS — E782 Mixed hyperlipidemia: Secondary | ICD-10-CM

## 2023-11-05 NOTE — Telephone Encounter (Signed)
Requested medication (s) are due for refill today: yes  Requested medication (s) are on the active medication list: yes  Last refill:  08/09/23  Future visit scheduled: yes  Notes to clinic:  routing for approval, no protocol attached     Requested Prescriptions  Pending Prescriptions Disp Refills   atorvastatin (LIPITOR) 40 MG tablet [Pharmacy Med Name: ATORVASTATIN 40 MG TABLET] 90 tablet 0    Sig: TAKE 1 TABLET BY MOUTH EVERYDAY AT BEDTIME     There is no refill protocol information for this order

## 2023-12-01 ENCOUNTER — Other Ambulatory Visit: Payer: Self-pay | Admitting: Family Medicine

## 2023-12-01 DIAGNOSIS — M549 Dorsalgia, unspecified: Secondary | ICD-10-CM

## 2023-12-03 NOTE — Telephone Encounter (Signed)
Requested medication (s) are due for refill today: no  Requested medication (s) are on the active medication list: yes  Last refill:  09/03/23 #270 1 RF  Future visit scheduled: yes  Notes to clinic:  Med not delegated to NT to RF- asking for RF too early   Requested Prescriptions  Pending Prescriptions Disp Refills   tiZANidine (ZANAFLEX) 4 MG tablet [Pharmacy Med Name: TIZANIDINE HCL 4 MG TABLET] 270 tablet 1    Sig: TAKE 1 TABLET BY MOUTH EVERY 8 HOURS AS NEEDED     Not Delegated - Cardiovascular:  Alpha-2 Agonists - tizanidine Failed - 12/01/2023  9:40 AM      Failed - This refill cannot be delegated      Passed - Valid encounter within last 6 months    Recent Outpatient Visits           3 months ago High risk medication use   [facility] [place] Hoy Register, MD   6 months ago Essential hypertension   [facility] [place]. [facility] Hoy Register, MD       Future Appointments             Tomorrow Hoy Register, MD [facility]. [facility]

## 2023-12-04 ENCOUNTER — Ambulatory Visit: Payer: Medicare Other | Attending: Family Medicine | Admitting: Family Medicine

## 2023-12-04 ENCOUNTER — Encounter: Payer: Self-pay | Admitting: Family Medicine

## 2023-12-04 VITALS — BP 139/87 | HR 99 | Ht 66.0 in | Wt 170.2 lb

## 2023-12-04 DIAGNOSIS — F319 Bipolar disorder, unspecified: Secondary | ICD-10-CM | POA: Insufficient documentation

## 2023-12-04 DIAGNOSIS — I10 Essential (primary) hypertension: Secondary | ICD-10-CM | POA: Insufficient documentation

## 2023-12-04 DIAGNOSIS — E782 Mixed hyperlipidemia: Secondary | ICD-10-CM | POA: Diagnosis not present

## 2023-12-04 DIAGNOSIS — M549 Dorsalgia, unspecified: Secondary | ICD-10-CM | POA: Diagnosis not present

## 2023-12-04 DIAGNOSIS — E7800 Pure hypercholesterolemia, unspecified: Secondary | ICD-10-CM | POA: Diagnosis not present

## 2023-12-04 DIAGNOSIS — Z79899 Other long term (current) drug therapy: Secondary | ICD-10-CM | POA: Diagnosis not present

## 2023-12-04 DIAGNOSIS — Z5901 Sheltered homelessness: Secondary | ICD-10-CM | POA: Diagnosis not present

## 2023-12-04 DIAGNOSIS — Z59819 Housing instability, housed unspecified: Secondary | ICD-10-CM

## 2023-12-04 MED ORDER — AMLODIPINE BESYLATE 10 MG PO TABS: 10.0000 mg | ORAL_TABLET | Freq: Every day | ORAL | 1 refills | Status: DC

## 2023-12-04 MED ORDER — ATORVASTATIN CALCIUM 40 MG PO TABS: 40.0000 mg | ORAL_TABLET | Freq: Every day | ORAL | 1 refills | Status: DC

## 2023-12-04 MED ORDER — CARVEDILOL 3.125 MG PO TABS: 3.1250 mg | ORAL_TABLET | Freq: Two times a day (BID) | ORAL | 1 refills | Status: DC
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
## 2023-12-04 NOTE — Patient Instructions (Addendum)
VISIT SUMMARY:  You had a routine follow-up appointment today to review your ongoing health conditions, including bipolar disorder, hypertension, and hyperlipidemia. We also discussed your current living situation and the need for stable housing. Your medications are working well, and we have a plan to address your elevated cholesterol levels and upcoming colonoscopy.  YOUR PLAN:  -BIPOLAR DISORDER: Bipolar disorder is a mental health condition characterized by extreme mood swings. Your condition is stable with your current medications and therapy. You will also start peer counseling when it becomes available. Please continue your current medications and therapy sessions.  -HYPERTENSION: Hypertension, or high blood pressure, is well controlled with your current medications, amlodipine and carvedilol. Please continue taking these medications as prescribed.  -HYPERCHOLESTEROLEMIA: Hypercholesterolemia means you have high cholesterol levels. Your last cholesterol check in April was elevated. We will draw a fasting lipid panel on 12/05/2023 to recheck your levels. Continue taking atorvastatin and try to follow a low cholesterol diet and increase your physical activity.  -HOUSING INSTABILITY: You are currently living in a hotel room and need stable housing. We provided you with a contact for a potential temporary housing resource. Please reach out to them for assistance.  I also referred you to the value based care Institute who will reach out to you with resources.  -COLON CANCER SCREENING: You are due for a colonoscopy, which is a screening test for colon cancer. However, we will defer this test until you have stable housing.  INSTRUCTIONS:  Please follow up in 6 months. We will also draw a fasting lipid panel on 12/05/2023 to check your cholesterol levels.

## 2023-12-05 ENCOUNTER — Telehealth: Payer: Self-pay | Admitting: *Deleted

## 2023-12-05 ENCOUNTER — Ambulatory Visit: Payer: Medicare Other | Attending: Family Medicine

## 2023-12-05 DIAGNOSIS — E782 Mixed hyperlipidemia: Secondary | ICD-10-CM

## 2023-12-05 NOTE — Progress Notes (Signed)
  Care Coordination   Note   12/05/2023 Name: [name] MRN: [id number] DOB: May 30, 1956  Stephen Delacruz is a 67 y.o. year old male who sees Hoy Register, MD for primary care. I reached out to [name] by phone today to offer care coordination services.  Mr. Ravenscraft was given information about Care Coordination services today including:   The Care Coordination services include support from the care team which includes your Nurse Coordinator, Clinical Social Worker, or Pharmacist.  The Care Coordination team is here to help remove barriers to the health concerns and goals most important to you. Care Coordination services are voluntary, and the patient may decline or stop services at any time by request to their care team member.   Care Coordination Consent Status: Patient brother POA Jmarcus Bloemker agreed to services and verbal consent obtained.   Follow up plan:  Telephone appointment with care coordination team member scheduled for:  12/26  Encounter Outcome:  Patient Scheduled  [facility] Coordination Care Guide  Direct Dial: [phone]

## 2023-12-05 NOTE — Progress Notes (Signed)
  Care Coordination  Outreach Note  12/05/2023 Name: Stephen Delacruz MRN: [id number] DOB: 13-May-1956   Care Coordination Outreach Attempts: An unsuccessful telephone outreach was attempted today to offer the patient information about available care coordination services.  Follow Up Plan:  No further outreach attempts will be made at this time. We have been unable to contact the patient to offer or enroll patient in care coordination services  Encounter Outcome:  No Answer  Gwenevere Ghazi  Care Coordination Care Guide  Direct Dial: [phone]

## 2023-12-06 ENCOUNTER — Other Ambulatory Visit: Payer: Self-pay | Admitting: Family Medicine

## 2023-12-06 DIAGNOSIS — E782 Mixed hyperlipidemia: Secondary | ICD-10-CM

## 2023-12-06 LAB — LP+NON-HDL CHOLESTEROL
Cholesterol, Total: 202 mg/dL — ABNORMAL HIGH (ref 100–199)
HDL: 34 mg/dL — ABNORMAL LOW (ref >39)
LDL Chol Calc (NIH): 142 mg/dL — ABNORMAL HIGH (ref 0–99)
Total Non-HDL-Chol (LDL+VLDL): 168 mg/dL — ABNORMAL HIGH (ref 0–129)
Triglycerides: 145 mg/dL (ref 0–149)
VLDL Cholesterol Cal: 26 mg/dL (ref 5–40)

## 2023-12-06 LAB — CMP14+EGFR
ALT: 28 [IU]/L (ref 0–44)
AST: 17 [IU]/L (ref 0–40)
Albumin: 4.6 g/dL (ref 3.9–4.9)
Alkaline Phosphatase: 95 [IU]/L (ref 44–121)
BUN/Creatinine Ratio: 19 (ref 10–24)
BUN: 27 mg/dL (ref 8–27)
Bilirubin Total: 1.5 mg/dL — ABNORMAL HIGH (ref 0.0–1.2)
CO2: 25 mmol/L (ref 20–29)
Calcium: 9.9 mg/dL (ref 8.6–10.2)
Chloride: 101 mmol/L (ref 96–106)
Creatinine, Ser: 1.39 mg/dL — ABNORMAL HIGH (ref 0.76–1.27)
Globulin, Total: 2.4 g/dL (ref 1.5–4.5)
Glucose: 100 mg/dL — ABNORMAL HIGH (ref 70–99)
Potassium: 4.4 mmol/L (ref 3.5–5.2)
Sodium: 140 mmol/L (ref 134–144)
Total Protein: 7 g/dL (ref 6.0–8.5)
eGFR: 56 mL/min/{1.73_m2} — ABNORMAL LOW (ref >59)

## 2023-12-06 MED ORDER — ATORVASTATIN CALCIUM 80 MG PO TABS: 80.0000 mg | ORAL_TABLET | Freq: Every day | ORAL | 1 refills | Status: DC

## 2023-12-20 ENCOUNTER — Ambulatory Visit: Payer: Self-pay | Admitting: Licensed Clinical Social Worker

## 2023-12-20 NOTE — Patient Outreach (Signed)
  Care Coordination   Initial Visit Note   12/20/2023 Name: Stephen Delacruz MRN: [id number] DOB: Apr 21, 1956  Stephen Delacruz is a 67 y.o. year old male who sees Hoy Register, MD for primary care. I spoke with  [name] and POA ( Sister, Darl Pikes [facility]) Chance by phone today.  What matters to the patients health and wellness today?  Housing insecurities     Goals Addressed             This Visit's Progress    Care Coordination Activities       Care Coordination Interventions: Patient and POA Carlis Stable [facility]) were on a 3-way call and it was stated that the patient is currently staying at extended stay and has been there since March 2024. The patient has been working with Koren Shiver and a Retail buyer by the name of [employer], and also working with [facility] and patient has not heard anything back. SW stated that housing is a huge issue in [place], but will mail out some housing resources to the patient and to the POA.  Patient has great support from family for transportation and other resources.  SW went over the SDOH screening and no other needs or issues.  SW will follow up on 01/07/2024 at 10:00 am.        SDOH assessments and interventions completed:  Yes  SDOH Interventions Today    Flowsheet Row Most Recent Value  SDOH Interventions   Food Insecurity Interventions Intervention Not Indicated  Transportation Interventions Intervention Not Indicated  Utilities Interventions Intervention Not Indicated        Care Coordination Interventions:  Yes, provided  Interventions Today    Flowsheet Row Most Recent Value  General Interventions   General Interventions Discussed/Reviewed General Interventions Discussed, Community Resources  [Housing insecurities]        Follow up plan: Follow up call scheduled for 01/07/2024 at 10:00 am    Encounter Outcome:  Patient Visit Completed   Jeanie Cooks, PhD [facility], [facility] Social Worker Direct Dial: [phone]  Fax: [phone]

## 2023-12-20 NOTE — Patient Instructions (Signed)
Visit Information  Thank you for taking time to visit with me today. Please don't hesitate to contact me if I can be of assistance to you.   Following are the goals we discussed today:   Goals Addressed             This Visit's Progress    Care Coordination Activities       Care Coordination Interventions: Patient and POA Carlis Stable [facility]) were on a 3-way call and it was stated that the patient is currently staying at extended stay and has been there since March 2024. The patient has been working with Koren Shiver and a Retail buyer by the name of [employer], and also working with [facility] and patient has not heard anything back. SW stated that housing is a huge issue in [place], but will mail out some housing resources to the patient and to the POA.  Patient has great support from family for transportation and other resources.  SW went over the SDOH screening and no other needs or issues.  SW will follow up on 01/07/2024 at 10:00 am.        Our next appointment is by telephone on 01/07/2024 at 10:00 am  Please call the care guide team at [phone] if you need to cancel or reschedule your appointment.   If you are experiencing a Mental Health or Behavioral Health Crisis or need someone to talk to, please call the Suicide and Crisis Lifeline: 988 go to [facility] Urgent [facility] [address], [place] [phone]) call 911  Patient verbalizes understanding of instructions and care plan provided today and agrees to view in MyChart. Active MyChart status and patient understanding of how to access instructions and care plan via MyChart confirmed with patient.     Jeanie Cooks, PhD [facility], [place] [facility] Social Worker Direct Dial: [phone]  Fax: [phone]

## 2023-12-31 ENCOUNTER — Ambulatory Visit (HOSPITAL_BASED_OUTPATIENT_CLINIC_OR_DEPARTMENT_OTHER): Payer: Medicare Other | Admitting: Student

## 2023-12-31 DIAGNOSIS — F5104 Psychophysiologic insomnia: Secondary | ICD-10-CM | POA: Diagnosis not present

## 2023-12-31 DIAGNOSIS — F319 Bipolar disorder, unspecified: Secondary | ICD-10-CM

## 2023-12-31 MED ORDER — TRAZODONE HCL 50 MG PO TABS: 50.0000 mg | ORAL_TABLET | Freq: Every evening | ORAL | 2 refills | Status: DC | PRN
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
## 2024-01-02 NOTE — Addendum Note (Signed)
 Addended by: Everlena Cooper on: 01/02/2024 11:57 AM   Modules accepted: Level of Service

## 2024-01-04 ENCOUNTER — Telehealth: Payer: Self-pay | Admitting: Licensed Clinical Social Worker

## 2024-01-04 NOTE — Patient Outreach (Signed)
  Care Coordination   Follow Up Visit Note   01/04/2024 Name: Jerrian Mells MRN: [id number] DOB: 07-12-1956  Christiaan Strebeck is a 68 y.o. year old male who sees Delbert Clam, MD for primary care. I  returned a call to the patients brother Vinnie Gombert, Community resources for housing, family has received the information and needed to know the next steps. SW left a message and explained that they could go ahead an talk to the housing resources in which they have found and discuss the application process and if any assistance needed form the SW to   What matters to the patients health and wellness today?  Housing resources that were mailed    Goals Addressed   None     SDOH assessments and interventions completed:  No     Care Coordination Interventions:  Yes, provided  Interventions Today    Flowsheet Row Most Recent Value  General Interventions   General Interventions Discussed/Reviewed General Interventions Reviewed, [employer] resources for housing, family has received the information and needed to know the next steps]        Follow up plan: Follow up call scheduled for 01/07/2024 at 10:00 am    Encounter Outcome:  No Answer  Sw left a message   .c

## 2024-01-04 NOTE — Patient Outreach (Signed)
  Care Coordination   Follow Up Visit Note   01/04/2024 Name: Jerrian Mells MRN: [id number] DOB: 1956/09/10  Waleed Dettman is a 68 y.o. year old male who sees Delbert Clam, MD for primary care. I spoke with  Fitzpatrick Takayama's brother Kiev Labrosse by phone today.  What matters to the patients health and wellness today?  Housing    Goals Addressed   None     SDOH assessments and interventions completed:  No     Care Coordination Interventions:  Yes, provided  Interventions Today    Flowsheet Row Most Recent Value  General Interventions   General Interventions Discussed/Reviewed General Interventions Reviewed, Science Writer and family have selected at least 2 housing delelopments, Chief Of Staff and [place] h[address] ([address]) and will make the calls to connect to find out about available property.]        Follow up plan: Follow up call scheduled for 01/07/2024 at 10:00 am    Encounter Outcome:  Patient Visit Completed  Tobias CHARM Maranda HEDWIG, PhD [facility], [facility] Social Worker Direct Dial: [phone]  Fax: [phone]

## 2024-01-07 ENCOUNTER — Ambulatory Visit: Payer: Self-pay | Admitting: Licensed Clinical Social Worker

## 2024-01-07 NOTE — Patient Instructions (Signed)
 Visit Information  Thank you for taking time to visit with me today. Please don't hesitate to contact me if I can be of assistance to you.   Following are the goals we discussed today:   Goals Addressed             This Visit's Progress    Care Coordination Activities   On track    Care Coordination Interventions: Patient and POA [name] [facility]) were on a 3-way call and it was stated that the patient is currently staying at extended stay and has been there since March 2024. The patient has been working with [name] and a retail buyer by the name of [name], and also working with [facility] and patient has not heard anything back. SW stated that housing is a huge issue in [place], but will mail out some housing resources to the patient and to the POA.  Patient has great support from family for transportation and other resources.  SW went over the SDOH screening and no other needs or issues.  SW will follow up in 2 weeks        Our next appointment is by telephone on 01/21/2024 at 10:00 am  Please call the care guide team at [phone] if you need to cancel or reschedule your appointment.   If you are experiencing a Mental Health or Behavioral Health Crisis or need someone to talk to, please call the Suicide and Crisis Lifeline: 988 go to [facility] Urgent [facility] [address], [place] [phone]) call 911  Patient verbalizes understanding of instructions and care plan provided today and agrees to view in MyChart. Active MyChart status and patient understanding of how to access instructions and care plan via MyChart confirmed with patient.     Tobias CHARM Maranda HEDWIG, PhD [facility], [facility] Social Worker Direct Dial: [phone]  Fax: [phone]

## 2024-01-07 NOTE — Patient Outreach (Signed)
  Care Coordination   Follow Up Visit Note   01/07/2024 Name: Stephen Delacruz MRN: [id number] DOB: [birth year]  Stephen Delacruz is a 68 y.o. year old male who sees Stephen Clam, MD for primary care. I spoke with  Stephen Delacruz' [name] by phone today.  What matters to the patients health and wellness today?  Housing     Goals Addressed             This Visit's Progress    Care Coordination Activities   On track    Care Coordination Interventions: Patient and POA Stephen Delacruz [facility]) were on a 3-way call and it was stated that the patient is currently staying at extended stay and has been there since March 2024. The patient has been working with Stephen Delacruz and a retail buyer by the name of Stephen Delacruz, and also working with [employer] and patient has not heard anything back. SW stated that housing is a huge issue in [place], but will mail out some housing resources to the patient and to the POA.  Patient has great support from family for transportation and other resources.  SW went over the SDOH screening and no other needs or issues.  SW will follow up in 2 weeks        SDOH assessments and interventions completed:  Yes  SDOH Interventions Today    Flowsheet Row Most Recent Value  SDOH Interventions   Food Insecurity Interventions Intervention Not Indicated  Housing Interventions Community Resources Provided, Other (Comment)  [SW provided resources for housing]  Transportation Interventions Intervention Not Indicated  Utilities Interventions Intervention Not Indicated        Care Coordination Interventions:  Yes, provided  Interventions Today    Flowsheet Row Most Recent Value  General Interventions   General Interventions Discussed/Reviewed General Interventions Reviewed, [name] has received housing resources and has been using them and calling to inquire]        Follow up plan: Follow up call scheduled for 01/21/2024 at 10:00 am     Encounter Outcome:  Patient Visit Completed   Stephen Delacruz Stephen Delacruz Stephen HEDWIG, PhD [facility], [facility] Social Worker Direct Dial: [phone]  Fax: [phone]

## 2024-01-21 ENCOUNTER — Ambulatory Visit: Payer: Self-pay | Admitting: Licensed Clinical Social Worker

## 2024-01-21 NOTE — Patient Outreach (Signed)
  Care Coordination   Follow Up Visit Note   01/21/2024 Name: Stephen Delacruz MRN: [id number] DOB: 12-Jul-1956  Stephen Delacruz is a 68 y.o. year old male who sees Hoy Register, MD for primary care. I spoke with  [name] ans brother Adelfo Diebel by phone today.  What matters to the patients health and wellness today?  Housing     Goals Addressed             This Visit's Progress    Care Coordination Activities       Care Coordination Interventions: [name] Annette Stable was on the call today they have made contact with some housing but there is a 2 to 3 year wait and still waiting to hear form some other housing  The patient has been working with Koren Shiver and a Retail buyer by the name of [employer], and also working with [facility] and patient has not heard anything back. SW stated that housing is a huge issue in [place], but will mail out some housing resources to the patient and to the POA. Family still has not heard form the Financial risk analyst.  Patient has great support from family for transportation and other resources.  SW went over the SDOH screening and no other needs or issues.  SW will follow up in 4 weeks, per the family request due to the family being out of the country for 3 to 4 weeks. Sw will do a follow up on 02/18/2024 at 9:30 am        SDOH assessments and interventions completed:  Yes  SDOH Interventions Today    Flowsheet Row Most Recent Value  SDOH Interventions   Food Insecurity Interventions Intervention Not Indicated  Housing Interventions Other (Comment)  [Patient is still lliving in a hotel and his brother is trying to assist him with with an apartment or other housing]  Transportation Interventions Intervention Not Indicated  Utilities Interventions Intervention Not Indicated        Care Coordination Interventions:  Yes, provided  Interventions Today    Flowsheet Row Most Recent Value  General Interventions   General Interventions  Discussed/Reviewed General Interventions Reviewed, [name] is still lliving in a hotel and his brother is trying to assist him with with an apartment or other housing]        Follow up plan: Follow up call scheduled for 02/19/2024 at 9:30 am    Encounter Outcome:  Patient Visit Completed   Jeanie Cooks, PhD [facility], [facility] Social Worker Direct Dial: [phone]  Fax: [phone]

## 2024-01-21 NOTE — Patient Instructions (Signed)
Visit Information  Thank you for taking time to visit with me today. Please don't hesitate to contact me if I can be of assistance to you.   Following are the goals we discussed today:   Goals Addressed             This Visit's Progress    Care Coordination Activities       Care Coordination Interventions: [name] Annette Stable was on the call today they have made contact with some housing but there is a 2 to 3 year wait and still waiting to hear form some other housing  The patient has been working with Koren Shiver and a Retail buyer by the name of [employer], and also working with [facility] and patient has not heard anything back. SW stated that housing is a huge issue in [place], but will mail out some housing resources to the patient and to the POA. Family still has not heard form the Financial risk analyst.  Patient has great support from family for transportation and other resources.  SW went over the SDOH screening and no other needs or issues.  SW will follow up in 4 weeks, per the family request due to the family being out of the country for 3 to 4 weeks. Sw will do a follow up on 02/18/2024 at 9:30 am        Our next appointment is by telephone on 02/18/2024 at 9:30 am  Please call the care guide team at [phone] if you need to cancel or reschedule your appointment.   If you are experiencing a Mental Health or Behavioral Health Crisis or need someone to talk to, please call the Suicide and Crisis Lifeline: 988 go to [facility] [address] [place] [phone]) call 911  Patient verbalizes understanding of instructions and care plan provided today and agrees to view in MyChart. Active MyChart status and patient understanding of how to access instructions and care plan via MyChart confirmed with patient.     Jeanie Cooks, PhD [facility], [facility] Social Worker Direct Dial:  [phone]  Fax: [phone]

## 2024-01-25 ENCOUNTER — Other Ambulatory Visit: Payer: Self-pay | Admitting: Family Medicine

## 2024-01-25 ENCOUNTER — Other Ambulatory Visit (HOSPITAL_COMMUNITY): Payer: Self-pay | Admitting: Student

## 2024-01-25 ENCOUNTER — Telehealth (HOSPITAL_COMMUNITY): Payer: Self-pay

## 2024-01-25 DIAGNOSIS — M549 Dorsalgia, unspecified: Secondary | ICD-10-CM

## 2024-01-25 DIAGNOSIS — F5104 Psychophysiologic insomnia: Secondary | ICD-10-CM

## 2024-01-25 DIAGNOSIS — F319 Bipolar disorder, unspecified: Secondary | ICD-10-CM

## 2024-01-25 MED ORDER — GABAPENTIN 300 MG PO CAPS: 300.0000 mg | ORAL_CAPSULE | Freq: Every day | ORAL | 2 refills | Status: DC

## 2024-01-25 MED ORDER — OXCARBAZEPINE 150 MG PO TABS: 150.0000 mg | ORAL_TABLET | Freq: Two times a day (BID) | ORAL | 2 refills | Status: DC

## 2024-01-25 MED ORDER — SERTRALINE HCL 50 MG PO TABS: 50.0000 mg | ORAL_TABLET | Freq: Every day | ORAL | 2 refills | Status: DC

## 2024-01-25 MED ORDER — OLANZAPINE 5 MG PO TABS: 5.0000 mg | ORAL_TABLET | Freq: Every day | ORAL | 2 refills | Status: DC

## 2024-01-25 MED ORDER — TRAZODONE HCL 50 MG PO TABS: 50.0000 mg | ORAL_TABLET | Freq: Every evening | ORAL | 2 refills | Status: DC | PRN

## 2024-01-25 NOTE — Telephone Encounter (Signed)
Duplicate request, medication reordered 01/25/24 Requested Prescriptions  Pending Prescriptions Disp Refills   gabapentin (NEURONTIN) 300 MG capsule [Pharmacy Med Name: GABAPENTIN 300 MG CAPSULE] 90 capsule 1    Sig: TAKE 1 CAPSULE BY MOUTH EVERYDAY AT BEDTIME     Neurology: Anticonvulsants - gabapentin Failed - 01/25/2024  2:44 PM      Failed - Cr in normal range and within 360 days    Creatinine, Ser  Date Value Ref Range Status  12/05/2023 1.39 (H) 0.76 - 1.27 mg/dL Final         Passed - Completed PHQ-2 or PHQ-9 in the last 360 days      Passed - Valid encounter within last 12 months    Recent Outpatient Visits           1 month ago Mixed hyperlipidemia   [facility] Of [place] Hoy Register, MD   4 months ago High risk medication use   [facility] Of [place] Hoy Register, MD   8 months ago Essential hypertension   [facility] [place] Of [place] Hoy Register, MD       Future Appointments             In 4 months Hoy Register, MD [facility] - [facility] Dept Of . [facility]

## 2024-01-25 NOTE — Telephone Encounter (Signed)
Patient was seen on 1/6 - no medications were sent in. Please review and advise, thank you

## 2024-02-12 ENCOUNTER — Ambulatory Visit
Admission: EM | Admit: 2024-02-12 | Discharge: 2024-02-12 | Disposition: A | Discharge status: Home / Self Care | Payer: Medicare Other | Attending: Family Medicine | Admitting: Family Medicine

## 2024-02-12 ENCOUNTER — Emergency Department (HOSPITAL_COMMUNITY)
Admission: EM | Admit: 2024-02-12 | Discharge: 2024-02-12 | Disposition: A | Discharge status: Home / Self Care | Payer: Medicare Other | Attending: Emergency Medicine | Admitting: Emergency Medicine

## 2024-02-12 ENCOUNTER — Encounter (HOSPITAL_COMMUNITY): Payer: Self-pay | Admitting: *Deleted

## 2024-02-12 ENCOUNTER — Other Ambulatory Visit: Payer: Self-pay

## 2024-02-12 DIAGNOSIS — Z79899 Other long term (current) drug therapy: Secondary | ICD-10-CM | POA: Insufficient documentation

## 2024-02-12 DIAGNOSIS — K130 Diseases of lips: Secondary | ICD-10-CM | POA: Diagnosis not present

## 2024-02-12 DIAGNOSIS — I10 Essential (primary) hypertension: Secondary | ICD-10-CM | POA: Insufficient documentation

## 2024-02-12 DIAGNOSIS — R220 Localized swelling, mass and lump, head: Secondary | ICD-10-CM

## 2024-02-12 LAB — COMPREHENSIVE METABOLIC PANEL
ALT: 17 U/L (ref 0–44)
AST: 17 U/L (ref 15–41)
Albumin: 3.9 g/dL (ref 3.5–5.0)
Alkaline Phosphatase: 77 U/L (ref 38–126)
Anion gap: 12 (ref 5–15)
BUN: 29 mg/dL — ABNORMAL HIGH (ref 8–23)
CO2: 22 mmol/L (ref 22–32)
Calcium: 9.7 mg/dL (ref 8.9–10.3)
Chloride: 104 mmol/L (ref 98–111)
Creatinine, Ser: 1.02 mg/dL (ref 0.61–1.24)
GFR, Estimated: >60 mL/min (ref >60)
Glucose, Bld: 133 mg/dL — ABNORMAL HIGH (ref 70–99)
Potassium: 3.9 mmol/L (ref 3.5–5.1)
Sodium: 138 mmol/L (ref 135–145)
Total Bilirubin: 0.7 mg/dL (ref 0.0–1.2)
Total Protein: 7.6 g/dL (ref 6.5–8.1)

## 2024-02-12 LAB — CBC WITH DIFFERENTIAL/PLATELET
Abs Immature Granulocytes: 0.12 10*3/uL — ABNORMAL HIGH (ref 0.00–0.07)
Basophils Absolute: 0.1 10*3/uL (ref 0.0–0.1)
Basophils Relative: 0 %
Eosinophils Absolute: 0.1 10*3/uL (ref 0.0–0.5)
Eosinophils Relative: 1 %
HCT: 49.3 % (ref 39.0–52.0)
Hemoglobin: 16.9 g/dL (ref 13.0–17.0)
Immature Granulocytes: 1 %
Lymphocytes Relative: 15 %
Lymphs Abs: 3.3 10*3/uL (ref 0.7–4.0)
MCH: 32.3 pg (ref 26.0–34.0)
MCHC: 34.3 g/dL (ref 30.0–36.0)
MCV: 94.1 fL (ref 80.0–100.0)
Monocytes Absolute: 1.9 10*3/uL — ABNORMAL HIGH (ref 0.1–1.0)
Monocytes Relative: 9 %
Neutro Abs: 15.8 10*3/uL — ABNORMAL HIGH (ref 1.7–7.7)
Neutrophils Relative %: 74 %
Platelets: 433 10*3/uL — ABNORMAL HIGH (ref 150–400)
RBC: 5.24 MIL/uL (ref 4.22–5.81)
RDW: 12.8 % (ref 11.5–15.5)
WBC: 21.3 10*3/uL — ABNORMAL HIGH (ref 4.0–10.5)
nRBC: 0 % (ref 0.0–0.2)

## 2024-02-12 MED ORDER — CLINDAMYCIN PHOSPHATE 600 MG/50ML IV SOLN
600.0000 mg | Freq: Once | INTRAVENOUS | Status: AC
  Administered 2024-02-12: 600 mg via INTRAVENOUS
  Filled 2024-02-12: qty 50

## 2024-02-12 MED ORDER — OXYCODONE-ACETAMINOPHEN 7.5-325 MG PO TABS: 1.0000 | ORAL_TABLET | ORAL | 0 refills | Status: DC | PRN

## 2024-02-12 MED ORDER — LIDOCAINE HCL 2 % IJ SOLN
10.0000 mL | Freq: Once | INTRAMUSCULAR | Status: DC
  Filled 2024-02-12: qty 20

## 2024-02-12 MED ORDER — CLINDAMYCIN HCL 150 MG PO CAPS: 450.0000 mg | ORAL_CAPSULE | Freq: Three times a day (TID) | ORAL | 0 refills | Status: AC

## 2024-02-12 MED ORDER — MORPHINE SULFATE (PF) 4 MG/ML IV SOLN
4.0000 mg | Freq: Once | INTRAVENOUS | Status: AC
  Administered 2024-02-12: 4 mg via INTRAVENOUS
  Filled 2024-02-12: qty 1

## 2024-02-12 NOTE — ED Provider Notes (Signed)
 [facility] EMERGENCY DEPARTMENT AT [facility] Provider Note   CSN: [id number] Arrival date & time: 02/12/24  1535     History Chief Complaint  Patient presents with   Oral Swelling    Stephen Delacruz is a 68 y.o. male.  Patient past history significant for hypertension, alcohol use disorder, history of illicit drug use here with concerns of lip swelling.  Reports that he was at urgent care earlier and was advised to come the emergency department for evaluation of swelling, and pain to the left lower lip.  States that this been ongoing since Thursday of last week.  Reports that he squeezed and popped what he thought was a pimple with a lot of blood coming out before symptoms began.  Since then, has had worsening swelling and pain as well as redness to the area.  Denies any fever, chills, body aches, difficulty swallowing, or throat pain.  Has not been started on any antibiotics for this.  HPI     Home Medications Prior to Admission medications   Medication Sig Start Date End Date Taking? Authorizing Provider  clindamycin (CLEOCIN) 150 MG capsule Take 3 capsules (450 mg total) by mouth 3 (three) times daily for 5 days. 02/12/24 02/17/24 Yes Smitty Knudsen, PA-C  oxyCODONE-acetaminophen (ENDOCET) 7.5-325 MG tablet Take 1 tablet by mouth every 4 (four) hours as needed for severe pain (pain score 7-10). 02/12/24  Yes Maryanna Shape A, PA-C  amLODipine (NORVASC) 10 MG tablet Take 1 tablet (10 mg total) by mouth daily. 12/04/23   Hoy Register, MD  atorvastatin (LIPITOR) 80 MG tablet Take 1 tablet (80 mg total) by mouth daily. 12/06/23   Hoy Register, MD  carvedilol (COREG) 3.125 MG tablet Take 1 tablet (3.125 mg total) by mouth 2 (two) times daily with a meal. 12/04/23   Hoy Register, MD  Cholecalciferol (VITAMIN D3) 10 MCG (400 UNIT) tablet TAKE 1 TABLET BY MOUTH DAILY. 07/17/23   Hoy Register, MD  gabapentin (NEURONTIN) 300 MG capsule Take 1 capsule (300 mg total) by mouth  at bedtime. 01/25/24   Carlyn Reichert, MD  OLANZapine (ZYPREXA) 5 MG tablet Take 1 tablet (5 mg total) by mouth at bedtime. 01/25/24 01/24/25  Carlyn Reichert, MD  OXcarbazepine (TRILEPTAL) 150 MG tablet Take 1 tablet (150 mg total) by mouth 2 (two) times daily. 01/25/24   Carlyn Reichert, MD  sertraline (ZOLOFT) 50 MG tablet Take 1 tablet (50 mg total) by mouth daily. 01/25/24   Carlyn Reichert, MD  tiZANidine (ZANAFLEX) 4 MG tablet TAKE 1 TABLET BY MOUTH EVERY 8 HOURS AS NEEDED 09/03/23   Hoy Register, MD  traZODone (DESYREL) 50 MG tablet Take 1 tablet (50 mg total) by mouth at bedtime as needed for sleep. 01/25/24   Carlyn Reichert, MD      Allergies    Prolixin [fluphenazine]    Review of Systems   Review of Systems  Skin:  Positive for color change.  All other systems reviewed and are negative.   Physical Exam Updated Vital Signs BP (!) 140/96 (BP Location: Right Arm)   Pulse 98   Temp 98.5 F (36.9 C) (Axillary)   Resp 16   Wt 74.8 kg   SpO2 100%   BMI 26.63 kg/m  Physical Exam Vitals and nursing note reviewed.  Constitutional:      General: He is not in acute distress.    Appearance: He is well-developed.  HENT:     Head: Normocephalic and atraumatic.  Comments: Area of significant skin induration, erythema, and swelling to the left lower lip.  No palpable fluctuance. Eyes:     Conjunctiva/sclera: Conjunctivae normal.  Cardiovascular:     Rate and Rhythm: Normal rate and regular rhythm.     Heart sounds: No murmur heard. Pulmonary:     Effort: Pulmonary effort is normal. No respiratory distress.     Breath sounds: Normal breath sounds.  Abdominal:     Palpations: Abdomen is soft.     Tenderness: There is no abdominal tenderness.  Musculoskeletal:        General: No swelling.     Cervical back: Neck supple.  Skin:    General: Skin is warm and dry.     Capillary Refill: Capillary refill takes less than 2 seconds.     Findings: Erythema present.   Neurological:     Mental Status: He is alert.  Psychiatric:        Mood and Affect: Mood normal.          ED Results / Procedures / Treatments   Labs (all labs ordered are listed, but only abnormal results are displayed) Labs Reviewed  CBC WITH DIFFERENTIAL/PLATELET - Abnormal; Notable for the following components:      Result Value   WBC 21.3 (*)    Platelets 433 (*)    Neutro Abs 15.8 (*)    Monocytes Absolute 1.9 (*)    Abs Immature Granulocytes 0.12 (*)    All other components within normal limits  COMPREHENSIVE METABOLIC PANEL - Abnormal; Notable for the following components:   Glucose, Bld 133 (*)    BUN 29 (*)    All other components within normal limits    EKG None  Radiology No results found.  Procedures .Incision and Drainage  Date/Time: 02/13/2024 11:29 AM  Performed by: Smitty Knudsen, PA-C Authorized by: Smitty Knudsen, PA-C   Consent:    Consent obtained:  Verbal   Consent given by:  Patient   Risks discussed:  Bleeding and incomplete drainage   Alternatives discussed:  Referral Universal protocol:    Procedure explained and questions answered to patient or proxy's satisfaction: yes     Site/side marked: yes     Patient identity confirmed:  Verbally with patient Location:    Type:  Abscess   Size:  4cm   Location:  Mouth   Mouth location: Lower lip. Pre-procedure details:    Skin preparation:  Chlorhexidine Sedation:    Sedation type:  None Anesthesia:    Anesthesia method:  Nerve block   Block location:  Left mental nerve   Block needle gauge:  27 G   Block anesthetic:  Lidocaine 2% w/o epi   Block injection procedure:  Anatomic landmarks identified, introduced needle, incremental injection, anatomic landmarks palpated and negative aspiration for blood   Block outcome:  Anesthesia achieved Procedure type:    Complexity:  Complex Procedure details:    Ultrasound guidance: yes     Needle aspiration: no     Incision depth:   Submucosal   Wound management:  Probed and deloculated and irrigated with saline   Drainage:  Purulent   Wound treatment:  Wound left open   Packing materials:  None Post-procedure details:    Procedure completion:  Tolerated with difficulty     Medications Ordered in ED Medications  morphine (PF) 4 MG/ML injection 4 mg (4 mg Intravenous Given 02/12/24 1932)  clindamycin (CLEOCIN) IVPB 600 mg (0 mg Intravenous Stopped 02/12/24  2129)    ED Course/ Medical Decision Making/ A&P                                 Medical Decision Making Amount and/or Complexity of Data Reviewed Labs: ordered.  Risk Prescription drug management.   This patient presents to the ED for concern of lip swelling.  Differential diagnosis includes gingivitis, dental abscess, peritonsillar abscess, Stevens-Johnson syndrome   Lab Tests:  I Ordered, and personally interpreted labs.  The pertinent results include: CBC with notable leukocytosis 21.3 primarily neutrophilic, CMP with hyperglycemia 133 and BUN elevated at 29   Medicines ordered and prescription drug management:  I ordered medication including viscous lidocaine, morphine, clindamycin for nerve block, pain, cellulitis Reevaluation of the patient after these medicines showed that the patient improved I have reviewed the patients home medicines and have made adjustments as needed   Problem List / ED Course:  Patient presents from the urgent care with concerns of oral swelling.  Patient past history significant for hypertension, alcohol use disorder, and prior history of illicit drug use.  Reportedly about 4 days ago had what he thought was a pimple or skin tag on his left lower lip that he tried to pop.  Reports that only bloody drainage came from the area.  Since then, has had worsening pain, swelling, and some occasional drainage from multiple areas of his lip.  Went to urgent care earlier today for evaluation was advised to come to the emergency  department for possible drainage and IV antibiotics.  Patient denies any systemic symptoms such as fever, chills, shortness of breath, sore throat, or difficulty swallowing. On exam, there is a notable area of erythematous and edematous skin involving the left lower lip.  Some notable induration also palpated.  Difficult to assess for fluctuance given the area of swelling.  Area is significantly tender. I consulted with ENT, Dr. Jenne Pane, who advised attempting to incise and drain the area the patient would require antibiotics and close follow-up with ENT in the office.  Will try to have patient evaluated tomorrow but due to weather, would likely not be able to see him for 2 to 3 days from today. Dental block performed and was able to more closely evaluate the lip with ultrasound at bedside which did demonstrate multiple areas of abscess.  As nerve block was already in fact, the area had 1 small incision and drain to the inner lower lip.  A moderate amount of purulent drainage was able to be expressed out.  Some relief in symptoms.  Dose of IV clindamycin given in the emergency department with plans for outpatient follow-up with ENT.  Instructed patient has contact examination for Dr. Dionicio Stall office with [facility].  Patient discharged home with a prescription for clindamycin as well as Percocet for pain.  Advised patient return the emergency department for any new or worsening symptoms.   Social Determinants of Health:  Prior history of substance use disorder.   Final Clinical Impression(s) / ED Diagnoses Final diagnoses:  Abscess of lip  Cellulitis of lip    Rx / DC Orders ED Discharge Orders          Ordered    clindamycin (CLEOCIN) 150 MG capsule  3 times daily        02/12/24 2117    oxyCODONE-acetaminophen (ENDOCET) 7.5-325 MG tablet  Every 4 hours PRN        02/12/24  2117              Smitty Knudsen, PA-C 02/13/24 1133    Virgina Norfolk, DO 02/15/24 1152

## 2024-02-12 NOTE — Discharge Instructions (Signed)
 You were seen in the emergency department today for concerns of oral swelling.  You had some labs drawn which did show some signs of infection with white blood count elevated.  I spoke with our ENT on-call, Dr. Jenne Pane, who advised attempting to perform a nerve block and drain the area.  On ultrasound imaging, we were able to see if there was several pockets of abscess so incision and drainage was performed which did reveal some purulent drainage.  You are also started on clindamycin to treat the infection aspect of this abscess.  Went speak with Dr. Jenne Pane, he advised that you follow-up with him in office on Thursday or Friday.  You should reach out to him and plan on following up with him in his office this week.  For any concerns of new or worsening symptoms, return to the emergency department.  A prescription for clindamycin and Endocet was sent to your pharmacy for pain control.  Please take these as prescribed.

## 2024-02-12 NOTE — ED Triage Notes (Signed)
 BIB family from home. Sent from UC to ED for lower lip swelling and pain c/w abscess. Mentions "pimple on lowr lip on Thursday, which I squeezed and popped on Thursday, only blood came out". Denies fever, NVD, malaise, airway or respiratory issues, itching, rash, or other sx. Sublingual, submental, submandibular and neck areas unremarkable. Alert, NAD, calm, interactive. No drainage.

## 2024-02-12 NOTE — Discharge Instructions (Addendum)
 Please go to the ER for further evaluation of your symptoms

## 2024-02-12 NOTE — ED Notes (Signed)
 Patient is being discharged from the Urgent Care and sent to the Emergency Department via POV. Per Cheri Rous, NP, patient is in need of higher level of care due to facial swelling, possible abscess. Patient is aware and verbalizes understanding of plan of care.  Vitals:   02/12/24 1438  BP: 139/89  Pulse: (!) 117  Resp: 16  Temp: 97.8 F (36.6 C)  SpO2: 95%
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
## 2024-02-12 NOTE — ED Triage Notes (Signed)
 Pt presents to UC for c/o bottom lip swelling x3 days. Pt states he squeezed a pimple on it a few days ago.

## 2024-02-18 ENCOUNTER — Ambulatory Visit: Payer: Self-pay | Admitting: Licensed Clinical Social Worker

## 2024-02-18 NOTE — Patient Instructions (Signed)
 Visit Information  Thank you for taking time to visit with me today. Please don't hesitate to contact me if I can be of assistance to you.   Following are the goals we discussed today:   Goals Addressed             This Visit's Progress    Care Coordination Activities       Care Coordination Interventions: [name] Annette Stable was on the call today they have made contact with some housing but there is a 2 to 3 year wait and still waiting to hear form some other housing  The patient has been working with [employer] and a Retail buyer by the name of [employer], and also working with [facility] and patient has not heard anything back. SW stated that housing is a huge issue in [place], but will mail out some housing resources to the patient and to the POA. Family still has not heard form the Financial risk analyst.  Patient has applied for Section 8 and had to apply for his social security to complete the application process. Patient is meeting with a Peer advocate with [facility] on Monday and Thursday for 2 hours each visit.  Patient has great support from family for transportation and other resources.  SW went over the SDOH screening and no other needs or issues.  Patient and family wants the SW to continue to follow up Sw will do a follow up on 03/05/2024 at 10:15 am        Our next appointment is by telephone on 03/05/2024 at 10:15 am  Please call the care guide team at [phone] if you need to cancel or reschedule your appointment.   If you are experiencing a Mental Health or Behavioral Health Crisis or need someone to talk to, please call the Suicide and Crisis Lifeline: 988 go to [facility] Urgent [facility] [address], [place] [phone]) call 911  Patient verbalizes understanding of instructions and care plan provided today and agrees to view in MyChart. Active MyChart status and patient understanding of how to access instructions and care plan via MyChart  confirmed with patient.     Jeanie Cooks, PhD [facility], [facility] Social Worker Direct Dial: [phone]  Fax: [phone]

## 2024-02-18 NOTE — Patient Outreach (Addendum)
 Care Coordination   Follow Up Visit Note   02/18/2024 Name: [name] MRN: [id number] DOB: 03/09/1956  Stephen Delacruz is a 68 y.o. year old male who sees Stephen Register, MD for primary care. I spoke with  Stephen Delacruz and [name] by phone today.  What matters to the patients health and wellness today?  Housing     Goals Addressed             This Visit's Progress    Care Coordination Activities       Care Coordination Interventions: [name] was on the call today they have made contact with some housing but there is a 2 to 3 year wait and still waiting to hear form some other housing  The patient has been working with [employer] and a Retail buyer by the name of [employer], and also working with [facility] and patient has not heard anything back. SW stated that housing is a huge issue in [place], but will mail out some housing resources to the patient and to the POA. Family still has not heard form the Financial risk analyst.  Patient has applied for Section 8 and had to apply for his social security to complete the application process. Patient is meeting with a Peer advocate with [facility] on Monday and Thursday for 2 hours each visit.  Patient has great support from family for transportation and other resources.  SW went over the SDOH screening and no other needs or issues.  Patient and family wants the SW to continue to follow up Sw will do a follow up on 03/05/2024 at 10:15 am        SDOH assessments and interventions completed:  Yes  SDOH Interventions Today    Flowsheet Row Most Recent Value  SDOH Interventions   Food Insecurity Interventions Intervention Not Indicated  Housing Interventions Community Resources Provided  Charlyne Mom is living in a hotel and is applying for Section 8, family is assisting]  Transportation Interventions Intervention Not Indicated  Utilities Interventions Intervention Not Indicated        Care Coordination  Interventions:  Yes, provided  Interventions Today    Flowsheet Row Most Recent Value  General Interventions   General Interventions Discussed/Reviewed General Interventions Reviewed, [name] had to apply for another social security to be able to apply for Section 8]        Follow up plan: Follow up call scheduled for 03/05/2024 at 10:15 am    Encounter Outcome:  Patient Visit Completed   Jeanie Cooks, PhD [facility], [facility] Social Worker Direct Dial: [phone]  Fax: [phone]

## 2024-02-19 ENCOUNTER — Other Ambulatory Visit (HOSPITAL_COMMUNITY): Payer: Self-pay | Admitting: Student

## 2024-02-19 DIAGNOSIS — F319 Bipolar disorder, unspecified: Secondary | ICD-10-CM

## 2024-02-26 ENCOUNTER — Other Ambulatory Visit: Payer: Self-pay | Admitting: Family Medicine

## 2024-02-26 DIAGNOSIS — M549 Dorsalgia, unspecified: Secondary | ICD-10-CM

## 2024-02-27 NOTE — Telephone Encounter (Signed)
 Requested medication (s) are due for refill today -yes  Requested medication (s) are on the active medication list -yes  Future visit scheduled -yes  Last refill: 09/03/23 #270 1RF  Notes to clinic: non delegated Rx  Requested Prescriptions  Pending Prescriptions Disp Refills   tiZANidine (ZANAFLEX) 4 MG tablet [Pharmacy Med Name: TIZANIDINE HCL 4 MG TABLET] 270 tablet 1    Sig: TAKE 1 TABLET BY MOUTH EVERY 8 HOURS AS NEEDED     Not Delegated - Cardiovascular:  Alpha-2 Agonists - tizanidine Failed - 02/27/2024 10:13 AM      Failed - This refill cannot be delegated      Passed - Valid encounter within last 6 months    Recent Outpatient Visits           2 months ago Mixed hyperlipidemia   [facility] [place] Hoy Register, MD   5 months ago High risk medication use   [facility] [place] Hoy Register, MD   9 months ago Essential hypertension   [facility] [place] Hoy Register, MD       Future Appointments             In 3 months Hoy Register, MD [facility] [place]               Requested Prescriptions  Pending Prescriptions Disp Refills   tiZANidine (ZANAFLEX) 4 MG tablet [Pharmacy Med Name: TIZANIDINE HCL 4 MG TABLET] 270 tablet 1    Sig: TAKE 1 TABLET BY MOUTH EVERY 8 HOURS AS NEEDED     Not Delegated - Cardiovascular:  Alpha-2 Agonists - tizanidine Failed - 02/27/2024 10:13 AM      Failed - This refill cannot be delegated      Passed - Valid encounter within last 6 months    Recent Outpatient Visits           2 months ago Mixed hyperlipidemia   [facility] [place] Hoy Register, MD   5 months ago High risk medication use   [facility] [place]  [facility] Hoy Register, MD   9 months ago Essential hypertension   [facility] [place]. [facility] Hoy Register, MD       Future Appointments             In 3 months Hoy Register, MD [facility] [place] - [facility] Of [place]. [facility]

## 2024-03-05 ENCOUNTER — Ambulatory Visit: Payer: Self-pay | Admitting: Licensed Clinical Social Worker

## 2024-03-05 NOTE — Patient Instructions (Signed)
 Visit Information  Thank you for taking time to visit with me today. Please don't hesitate to contact me if I can be of assistance to you.   Following are the goals we discussed today:   Goals Addressed             This Visit's Progress    COMPLETED: Care Coordination Activities       Care Coordination Interventions: [name] Annette Stable was on the call today they have made contact with some housing but there is a 2 to 3 year wait and still waiting to hear form some other housing  The patient has been working with [employer] and a Retail buyer by the name of [employer], and also working with [facility] and patient has not heard anything back. SW stated that housing is a huge issue in [place], but will mail out some housing resources to the patient and to the POA. Family still has not heard form the Financial risk analyst.  Patient has applied for Section 8 and had to apply for his social security to complete the application process. Patient is meeting with a Peer advocate with [facility] on Monday and Thursday for 2 hours each visit.  Patient has great support from family for transportation and other resources.  SW went over the SDOH screening and no other needs or issues.  Patient and family wants the SW to continue to follow up Sw will do a follow up on 03/05/2024 at 10:15 am        No further follow up, Sw encouraged the patient to contact PCP to get back on the SW's schedule if any SDOH needs arise.   Please call the care guide team at [phone] if you need to cancel or reschedule your appointment.   If you are experiencing a Mental Health or Behavioral Health Crisis or need someone to talk to, please call the Suicide and Crisis Lifeline: 988 go to [facility] Urgent [facility] [address], [place] [phone]) call 911  Patient verbalizes understanding of instructions and care plan provided today and agrees to view in MyChart. Active MyChart status and patient  understanding of how to access instructions and care plan via MyChart confirmed with patient.     Jeanie Cooks, PhD [facility], [facility] Social Worker Direct Dial: [phone]  Fax: [phone]

## 2024-03-05 NOTE — Patient Outreach (Signed)
 Care Coordination   Follow Up Visit Note   03/05/2024 Name: Stephen Delacruz MRN: [id number] DOB: [birth year]  Stephen Delacruz is a 68 y.o. year old male who sees Stephen Register, MD for primary care. I spoke with  Stephen Delacruz by phone today.  What matters to the patients health and wellness today?  Housing     Goals Addressed             This Visit's Progress    COMPLETED: Care Coordination Activities       Care Coordination Interventions: [name] was on the call today they have made contact with some housing but there is a 2 to 3 year wait and still waiting to hear form some other housing  The patient has been working with Stephen Delacruz and a Retail buyer by the name of Stephen Delacruz, and also working with [facility] and patient has not heard anything back. SW stated that housing is a huge issue in [place], but will mail out some housing resources to the patient and to the POA. Family still has not heard form the Financial risk analyst.  Patient has applied for Section 8 and had to apply for his social security to complete the application process. Patient is meeting with a Peer advocate with [facility] on Monday and Thursday for 2 hours each visit.  Patient has great support from family for transportation and other resources.  SW went over the SDOH screening and no other needs or issues.  Patient and family wants the SW to continue to follow up Sw will do a follow up on 03/05/2024 at 10:15 am        SDOH assessments and interventions completed:  Yes  SDOH Interventions Today    Flowsheet Row Most Recent Value  SDOH Interventions   Food Insecurity Interventions Intervention Not Indicated  Housing Interventions Intervention Not Indicated  Transportation Interventions Intervention Not Indicated  Utilities Interventions Intervention Not Indicated        Care Coordination Interventions:  Yes, provided  Interventions Today    Flowsheet Row Most Recent Value  General Interventions    General Interventions Discussed/Reviewed General Interventions Reviewed, [name] has everything in place for getting his SS card and  in the process in getting his Section 8 letter aprroved. SW will not do a follow up call.]        Follow up plan: No further intervention required.   Encounter Outcome:  Patient Visit Completed  Jeanie Cooks, PhD [facility], [facility] Social Worker Direct Dial: [phone]  Fax: [phone]

## 2024-03-31 ENCOUNTER — Ambulatory Visit (HOSPITAL_BASED_OUTPATIENT_CLINIC_OR_DEPARTMENT_OTHER): Payer: Medicare Other | Admitting: Student

## 2024-03-31 DIAGNOSIS — F5104 Psychophysiologic insomnia: Secondary | ICD-10-CM | POA: Diagnosis not present

## 2024-03-31 DIAGNOSIS — F319 Bipolar disorder, unspecified: Secondary | ICD-10-CM

## 2024-03-31 MED ORDER — SERTRALINE HCL 50 MG PO TABS: 50.0000 mg | ORAL_TABLET | Freq: Every day | ORAL | 2 refills | Status: DC

## 2024-03-31 MED ORDER — OXCARBAZEPINE 150 MG PO TABS: 150.0000 mg | ORAL_TABLET | Freq: Two times a day (BID) | ORAL | 2 refills | Status: DC

## 2024-03-31 MED ORDER — TRAZODONE HCL 50 MG PO TABS
50.0000 mg | ORAL_TABLET | Freq: Every evening | ORAL | 2 refills | Status: DC | PRN
Start: 2024-03-31 — End: 2024-04-28

## 2024-03-31 MED ORDER — OLANZAPINE 5 MG PO TABS
5.0000 mg | ORAL_TABLET | Freq: Every day | ORAL | 2 refills | Status: DC
Start: 2024-03-31 — End: 2024-09-08
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
## 2024-04-28 ENCOUNTER — Other Ambulatory Visit (HOSPITAL_COMMUNITY): Payer: Self-pay | Admitting: Student

## 2024-04-28 DIAGNOSIS — F5104 Psychophysiologic insomnia: Secondary | ICD-10-CM

## 2024-04-28 MED ORDER — TRAZODONE HCL 50 MG PO TABS: 50.0000 mg | ORAL_TABLET | Freq: Every evening | ORAL | 1 refills | Status: DC | PRN

## 2024-05-17 ENCOUNTER — Other Ambulatory Visit: Payer: Self-pay | Admitting: Family Medicine

## 2024-05-17 DIAGNOSIS — E782 Mixed hyperlipidemia: Secondary | ICD-10-CM

## 2024-05-17 DIAGNOSIS — I10 Essential (primary) hypertension: Secondary | ICD-10-CM

## 2024-05-20 ENCOUNTER — Telehealth (HOSPITAL_COMMUNITY): Payer: Self-pay

## 2024-05-20 NOTE — Telephone Encounter (Signed)
 Hello,    Pt is requesting for a refill gabapentin . Not sure if this was sent in but saw that you had seen pt recently.

## 2024-05-21 ENCOUNTER — Other Ambulatory Visit (HOSPITAL_COMMUNITY): Payer: Self-pay | Admitting: Student

## 2024-05-21 DIAGNOSIS — M549 Dorsalgia, unspecified: Secondary | ICD-10-CM

## 2024-05-21 MED ORDER — GABAPENTIN 300 MG PO CAPS: 300.0000 mg | ORAL_CAPSULE | Freq: Every day | ORAL | 2 refills | Status: DC

## 2024-05-23 ENCOUNTER — Other Ambulatory Visit: Payer: Self-pay | Admitting: Family Medicine

## 2024-05-23 DIAGNOSIS — E782 Mixed hyperlipidemia: Secondary | ICD-10-CM

## 2024-05-23 DIAGNOSIS — M549 Dorsalgia, unspecified: Secondary | ICD-10-CM

## 2024-05-23 DIAGNOSIS — I10 Essential (primary) hypertension: Secondary | ICD-10-CM

## 2024-06-02 ENCOUNTER — Ambulatory Visit (HOSPITAL_COMMUNITY): Admitting: Student

## 2024-06-03 ENCOUNTER — Encounter: Payer: Self-pay | Admitting: Family Medicine

## 2024-06-03 ENCOUNTER — Ambulatory Visit: Payer: Medicare Other | Attending: Family Medicine | Admitting: Family Medicine

## 2024-06-03 VITALS — BP 90/64 | HR 96 | Ht 66.0 in | Wt 192.2 lb

## 2024-06-03 DIAGNOSIS — M5459 Other low back pain: Secondary | ICD-10-CM | POA: Insufficient documentation

## 2024-06-03 DIAGNOSIS — I10 Essential (primary) hypertension: Secondary | ICD-10-CM | POA: Insufficient documentation

## 2024-06-03 DIAGNOSIS — Z87891 Personal history of nicotine dependence: Secondary | ICD-10-CM | POA: Diagnosis not present

## 2024-06-03 DIAGNOSIS — F3164 Bipolar disorder, current episode mixed, severe, with psychotic features: Secondary | ICD-10-CM | POA: Diagnosis not present

## 2024-06-03 DIAGNOSIS — E782 Mixed hyperlipidemia: Secondary | ICD-10-CM | POA: Diagnosis not present

## 2024-06-03 DIAGNOSIS — M47896 Other spondylosis, lumbar region: Secondary | ICD-10-CM | POA: Insufficient documentation

## 2024-06-03 DIAGNOSIS — G8929 Other chronic pain: Secondary | ICD-10-CM | POA: Insufficient documentation

## 2024-06-03 DIAGNOSIS — F1011 Alcohol abuse, in remission: Secondary | ICD-10-CM | POA: Insufficient documentation

## 2024-06-03 DIAGNOSIS — M549 Dorsalgia, unspecified: Secondary | ICD-10-CM | POA: Diagnosis present

## 2024-06-03 DIAGNOSIS — Z5900 Homelessness unspecified: Secondary | ICD-10-CM | POA: Insufficient documentation

## 2024-06-03 MED ORDER — IBUPROFEN 600 MG PO TABS: 600.0000 mg | ORAL_TABLET | Freq: Three times a day (TID) | ORAL | 1 refills | Status: DC | PRN

## 2024-06-03 MED ORDER — CARVEDILOL 3.125 MG PO TABS: 3.1250 mg | ORAL_TABLET | Freq: Two times a day (BID) | ORAL | 1 refills | Status: DC

## 2024-06-03 MED ORDER — LIDOCAINE 5 % EX PTCH: 1.0000 | MEDICATED_PATCH | CUTANEOUS | 1 refills | Status: DC

## 2024-06-03 MED ORDER — AMLODIPINE BESYLATE 5 MG PO TABS: 5.0000 mg | ORAL_TABLET | Freq: Every day | ORAL | 1 refills | Status: DC

## 2024-06-03 MED ORDER — ATORVASTATIN CALCIUM 80 MG PO TABS
80.0000 mg | ORAL_TABLET | Freq: Every day | ORAL | 1 refills | Status: AC
Start: 2024-06-03 — End: ?

## 2024-06-03 NOTE — Patient Instructions (Signed)
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
## 2024-06-12 ENCOUNTER — Other Ambulatory Visit (HOSPITAL_COMMUNITY): Payer: Self-pay | Admitting: Student

## 2024-06-12 DIAGNOSIS — F319 Bipolar disorder, unspecified: Secondary | ICD-10-CM

## 2024-06-15 ENCOUNTER — Other Ambulatory Visit: Payer: Self-pay

## 2024-06-15 ENCOUNTER — Emergency Department (HOSPITAL_COMMUNITY)
Admission: EM | Admit: 2024-06-15 | Discharge: 2024-06-17 | Disposition: A | Discharge status: Other Acute Inpatient Hospital | Attending: Emergency Medicine | Admitting: Emergency Medicine

## 2024-06-15 ENCOUNTER — Encounter (HOSPITAL_COMMUNITY): Payer: Self-pay | Admitting: Emergency Medicine

## 2024-06-15 DIAGNOSIS — F319 Bipolar disorder, unspecified: Secondary | ICD-10-CM | POA: Diagnosis present

## 2024-06-15 DIAGNOSIS — F22 Delusional disorders: Secondary | ICD-10-CM | POA: Insufficient documentation

## 2024-06-15 DIAGNOSIS — F14159 Cocaine abuse with cocaine-induced psychotic disorder, unspecified: Secondary | ICD-10-CM | POA: Insufficient documentation

## 2024-06-15 DIAGNOSIS — Z79899 Other long term (current) drug therapy: Secondary | ICD-10-CM | POA: Insufficient documentation

## 2024-06-15 DIAGNOSIS — F309 Manic episode, unspecified: Secondary | ICD-10-CM

## 2024-06-15 DIAGNOSIS — R456 Violent behavior: Secondary | ICD-10-CM | POA: Insufficient documentation

## 2024-06-15 DIAGNOSIS — R52 Pain, unspecified: Secondary | ICD-10-CM | POA: Diagnosis not present

## 2024-06-15 DIAGNOSIS — F14259 Cocaine dependence with cocaine-induced psychotic disorder, unspecified: Secondary | ICD-10-CM | POA: Insufficient documentation

## 2024-06-15 DIAGNOSIS — R45850 Homicidal ideations: Secondary | ICD-10-CM | POA: Diagnosis not present

## 2024-06-15 DIAGNOSIS — I10 Essential (primary) hypertension: Secondary | ICD-10-CM | POA: Insufficient documentation

## 2024-06-15 DIAGNOSIS — R4689 Other symptoms and signs involving appearance and behavior: Secondary | ICD-10-CM

## 2024-06-15 LAB — ETHANOL: Alcohol, Ethyl (B): <15 mg/dL (ref <15)

## 2024-06-15 LAB — URINALYSIS, ROUTINE W REFLEX MICROSCOPIC
Bilirubin Urine: NEGATIVE
Glucose, UA: NEGATIVE mg/dL
Hgb urine dipstick: NEGATIVE
Ketones, ur: NEGATIVE mg/dL
Leukocytes,Ua: NEGATIVE
Nitrite: NEGATIVE
Protein, ur: NEGATIVE mg/dL
Specific Gravity, Urine: 1.002 — ABNORMAL LOW (ref 1.005–1.030)
pH: 6 (ref 5.0–8.0)

## 2024-06-15 LAB — COMPREHENSIVE METABOLIC PANEL WITH GFR
ALT: 23 U/L (ref 0–44)
AST: 35 U/L (ref 15–41)
Albumin: 3.5 g/dL (ref 3.5–5.0)
Alkaline Phosphatase: 78 U/L (ref 38–126)
Anion gap: 9 (ref 5–15)
BUN: 22 mg/dL (ref 8–23)
CO2: 23 mmol/L (ref 22–32)
Calcium: 8.7 mg/dL — ABNORMAL LOW (ref 8.9–10.3)
Chloride: 107 mmol/L (ref 98–111)
Creatinine, Ser: 1.1 mg/dL (ref 0.61–1.24)
GFR, Estimated: >60 mL/min (ref >60)
Glucose, Bld: 105 mg/dL — ABNORMAL HIGH (ref 70–99)
Potassium: 3.5 mmol/L (ref 3.5–5.1)
Sodium: 139 mmol/L (ref 135–145)
Total Bilirubin: 0.6 mg/dL (ref 0.0–1.2)
Total Protein: 6.3 g/dL — ABNORMAL LOW (ref 6.5–8.1)

## 2024-06-15 LAB — I-STAT CHEM 8, ED
BUN: 25 mg/dL — ABNORMAL HIGH (ref 8–23)
Calcium, Ion: 1.06 mmol/L — ABNORMAL LOW (ref 1.15–1.40)
Chloride: 106 mmol/L (ref 98–111)
Creatinine, Ser: 1 mg/dL (ref 0.61–1.24)
Glucose, Bld: 97 mg/dL (ref 70–99)
HCT: 36 % — ABNORMAL LOW (ref 39.0–52.0)
Hemoglobin: 12.2 g/dL — ABNORMAL LOW (ref 13.0–17.0)
Potassium: 3.5 mmol/L (ref 3.5–5.1)
Sodium: 139 mmol/L (ref 135–145)
TCO2: 22 mmol/L (ref 22–32)

## 2024-06-15 LAB — CBC WITH DIFFERENTIAL/PLATELET
Abs Immature Granulocytes: 0.05 10*3/uL (ref 0.00–0.07)
Basophils Absolute: 0.1 10*3/uL (ref 0.0–0.1)
Basophils Relative: 0 %
Eosinophils Absolute: 0.2 10*3/uL (ref 0.0–0.5)
Eosinophils Relative: 2 %
HCT: 38.7 % — ABNORMAL LOW (ref 39.0–52.0)
Hemoglobin: 13 g/dL (ref 13.0–17.0)
Immature Granulocytes: 0 %
Lymphocytes Relative: 20 %
Lymphs Abs: 2.6 10*3/uL (ref 0.7–4.0)
MCH: 32.7 pg (ref 26.0–34.0)
MCHC: 33.6 g/dL (ref 30.0–36.0)
MCV: 97.2 fL (ref 80.0–100.0)
Monocytes Absolute: 1.3 10*3/uL — ABNORMAL HIGH (ref 0.1–1.0)
Monocytes Relative: 10 %
Neutro Abs: 8.4 10*3/uL — ABNORMAL HIGH (ref 1.7–7.7)
Neutrophils Relative %: 68 %
Platelets: 322 10*3/uL (ref 150–400)
RBC: 3.98 MIL/uL — ABNORMAL LOW (ref 4.22–5.81)
RDW: 13.9 % (ref 11.5–15.5)
WBC: 12.6 10*3/uL — ABNORMAL HIGH (ref 4.0–10.5)
nRBC: 0 % (ref 0.0–0.2)

## 2024-06-15 LAB — RAPID URINE DRUG SCREEN, HOSP PERFORMED
Amphetamines: NOT DETECTED
Barbiturates: NOT DETECTED
Benzodiazepines: NOT DETECTED
Cocaine: POSITIVE — AB
Opiates: NOT DETECTED
Tetrahydrocannabinol: NOT DETECTED

## 2024-06-15 LAB — CBG MONITORING, ED: Glucose-Capillary: 132 mg/dL — ABNORMAL HIGH (ref 70–99)

## 2024-06-15 LAB — CK: Total CK: 386 U/L (ref 49–397)

## 2024-06-15 MED ORDER — ZIPRASIDONE MESYLATE 20 MG IM SOLR: 20.0000 mg | Freq: Once | INTRAMUSCULAR | Status: AC

## 2024-06-15 MED ORDER — ZIPRASIDONE MESYLATE 20 MG IM SOLR
INTRAMUSCULAR | Status: AC
  Administered 2024-06-15: 20 mg via INTRAMUSCULAR
  Filled 2024-06-15: qty 20

## 2024-06-15 MED ORDER — STERILE WATER FOR INJECTION IJ SOLN
INTRAMUSCULAR | Status: AC
  Filled 2024-06-15: qty 10

## 2024-06-15 MED ORDER — OLANZAPINE 10 MG IM SOLR
10.0000 mg | Freq: Once | INTRAMUSCULAR | Status: AC
  Administered 2024-06-15: 10 mg via INTRAMUSCULAR
  Filled 2024-06-15: qty 10

## 2024-06-15 MED ORDER — OLANZAPINE 5 MG PO TABS
5.0000 mg | ORAL_TABLET | Freq: Every day | ORAL | Status: DC
  Administered 2024-06-15: 5 mg via ORAL
  Filled 2024-06-15: qty 1

## 2024-06-15 MED ORDER — ZIPRASIDONE HCL 20 MG PO CAPS: 20.0000 mg | ORAL_CAPSULE | Freq: Once | ORAL | Status: DC

## 2024-06-15 MED ORDER — THIAMINE HCL 100 MG/ML IJ SOLN
100.0000 mg | Freq: Once | INTRAMUSCULAR | Status: AC
  Administered 2024-06-15: 100 mg via INTRAVENOUS
  Filled 2024-06-15: qty 2
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
## 2024-06-15 NOTE — ED Triage Notes (Signed)
 Per [facility] pt coming from residence- called out by roommate. Patient has hx of schizophrenia and bipolar but is unmedicated. Patient has no specific complaint however stating he needs Ct scans, labs and EKG.
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
## 2024-06-15 NOTE — ED Notes (Signed)
 IVC documents brought to blue zone by PA Harris just now.
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
## 2024-06-15 NOTE — BH Assessment (Signed)
 Patient was deferred to IRIS for a telepsych assessment. The assigned care coordinator will provide updates regarding the scheduling of the assessment. [facility]IS care coordinator can be reached at [phone] for further information on the timing of the telepsych evaluation.

## 2024-06-16 DIAGNOSIS — F14259 Cocaine dependence with cocaine-induced psychotic disorder, unspecified: Secondary | ICD-10-CM | POA: Insufficient documentation

## 2024-06-16 DIAGNOSIS — F309 Manic episode, unspecified: Secondary | ICD-10-CM | POA: Diagnosis not present

## 2024-06-16 MED ORDER — LOPERAMIDE HCL 2 MG PO CAPS: 2.0000 mg | ORAL_CAPSULE | ORAL | Status: DC | PRN

## 2024-06-16 MED ORDER — GABAPENTIN 300 MG PO CAPS
300.0000 mg | ORAL_CAPSULE | Freq: Every day | ORAL | Status: DC
  Administered 2024-06-16: 300 mg via ORAL
  Filled 2024-06-16: qty 1

## 2024-06-16 MED ORDER — CARVEDILOL 3.125 MG PO TABS
3.1250 mg | ORAL_TABLET | Freq: Two times a day (BID) | ORAL | Status: DC
Start: 2024-06-16 — End: 2024-06-17
  Administered 2024-06-16 – 2024-06-17 (×2): 3.125 mg via ORAL
  Filled 2024-06-16 (×2): qty 1

## 2024-06-16 MED ORDER — METHOCARBAMOL 500 MG PO TABS
500.0000 mg | ORAL_TABLET | Freq: Three times a day (TID) | ORAL | Status: DC | PRN
  Administered 2024-06-17: 500 mg via ORAL
  Filled 2024-06-16: qty 1

## 2024-06-16 MED ORDER — TRAZODONE HCL 50 MG PO TABS
50.0000 mg | ORAL_TABLET | Freq: Every evening | ORAL | Status: DC | PRN
  Administered 2024-06-16: 50 mg via ORAL
  Filled 2024-06-16: qty 1

## 2024-06-16 MED ORDER — STERILE WATER FOR INJECTION IJ SOLN
INTRAMUSCULAR | Status: AC
  Filled 2024-06-16: qty 10

## 2024-06-16 MED ORDER — HYDROXYZINE HCL 25 MG PO TABS
25.0000 mg | ORAL_TABLET | Freq: Four times a day (QID) | ORAL | Status: DC | PRN
  Administered 2024-06-16 – 2024-06-17 (×2): 25 mg via ORAL
  Filled 2024-06-16 (×2): qty 1

## 2024-06-16 MED ORDER — ATORVASTATIN CALCIUM 80 MG PO TABS
80.0000 mg | ORAL_TABLET | Freq: Every day | ORAL | Status: DC
  Administered 2024-06-16 – 2024-06-17 (×2): 80 mg via ORAL
  Filled 2024-06-16 (×2): qty 1

## 2024-06-16 MED ORDER — OXCARBAZEPINE 150 MG PO TABS
150.0000 mg | ORAL_TABLET | Freq: Two times a day (BID) | ORAL | Status: DC
  Administered 2024-06-16 – 2024-06-17 (×3): 150 mg via ORAL
  Filled 2024-06-16 (×4): qty 1

## 2024-06-16 MED ORDER — DICYCLOMINE HCL 20 MG PO TABS: 20.0000 mg | ORAL_TABLET | Freq: Four times a day (QID) | ORAL | Status: DC | PRN

## 2024-06-16 MED ORDER — OXYCODONE-ACETAMINOPHEN 7.5-325 MG PO TABS
1.0000 | ORAL_TABLET | ORAL | Status: DC | PRN
  Administered 2024-06-16: 1 via ORAL
  Filled 2024-06-16: qty 1

## 2024-06-16 MED ORDER — OLANZAPINE 5 MG PO TABS
7.5000 mg | ORAL_TABLET | Freq: Every day | ORAL | Status: DC
  Administered 2024-06-16: 7.5 mg via ORAL
  Filled 2024-06-16: qty 1

## 2024-06-16 MED ORDER — AMLODIPINE BESYLATE 5 MG PO TABS
10.0000 mg | ORAL_TABLET | Freq: Every day | ORAL | Status: DC
  Administered 2024-06-16 – 2024-06-17 (×2): 10 mg via ORAL
  Filled 2024-06-16 (×2): qty 2

## 2024-06-16 MED ORDER — ONDANSETRON 4 MG PO TBDP: 4.0000 mg | ORAL_TABLET | Freq: Four times a day (QID) | ORAL | Status: DC | PRN

## 2024-06-16 MED ORDER — ZIPRASIDONE MESYLATE 20 MG IM SOLR
20.0000 mg | Freq: Once | INTRAMUSCULAR | Status: AC
  Administered 2024-06-16: 20 mg via INTRAMUSCULAR
  Filled 2024-06-16: qty 20

## 2024-06-16 MED ORDER — SERTRALINE HCL 50 MG PO TABS
50.0000 mg | ORAL_TABLET | Freq: Every day | ORAL | Status: DC
  Administered 2024-06-16 – 2024-06-17 (×2): 50 mg via ORAL
  Filled 2024-06-16 (×2): qty 1

## 2024-06-16 MED ORDER — NAPROXEN 250 MG PO TABS
500.0000 mg | ORAL_TABLET | Freq: Two times a day (BID) | ORAL | Status: DC | PRN
  Administered 2024-06-17: 500 mg via ORAL
  Filled 2024-06-16: qty 2

## 2024-06-16 NOTE — ED Notes (Signed)
 Stephen Delacruz returned my call. He stated that he spoke with the office of the civil magistrate and was informed that if it was okay with the [facility] staff, he could cross out the incorrect date and name and replace it with his information. I spoke with the NS Team Leader concerning the incorrect date (07/15/2024 instead of 06/15/2024 written by [employer] for the patient's IVC.  To prevent the delay of the patient's care, our Team Leader, gave permission for [employer] to cross out the incorrect date and write the correct date of 06/15/2024 and updated name of the officer.  3 copies were made for the purple zone and 1 copy placed in the red folder.  The corrected findings and custody order was uploaded to the patient's chart and e-filed.

## 2024-06-16 NOTE — ED Notes (Signed)
 IVC paperwork scanned, filed, and accepted. Case no [id number], expires 06/22/24 at 21:11. 4 copies made, original in purple zone red folder, 3 copies in purple w/ patient, 1 copy with patient labels in medical records.

## 2024-06-16 NOTE — ED Notes (Signed)
 Upon verification of the IVC documents, the findings and custody order has the wrong date. The patient was IVC'd on 06/15/2024, but the findings and custody order has 07/15/2024 signed by [employer] badge number [id number]. The findings and custody order was not scanned into e-file. Will discuss the IVC documents with Team Leader upon arrival.
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
## 2024-06-16 NOTE — Progress Notes (Signed)
 LCSW Progress Note  [id number]   Stephen Delacruz  06/16/2024  10:53 AM  Description:   Inpatient Psychiatric Referral  Patient was recommended inpatient per Nash Batter NP. There are no available beds at [facility], per [facility] RN Patient was referred to the following out of network facilities:   Destination  Service Provider Address Phone Fax  [facility]  [address] [place] [address] [phone]  [facility] EFAX  [address], [place] [phone]  [facility]  [address] [place], [place] [address] [phone]  [facility]  [address] [place] [address] [phone]      Situation ongoing, CSW to continue following and update chart as more information becomes available.      [name], MSW, LCSW  06/16/2024 10:53 AM
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
## 2024-06-16 NOTE — ED Notes (Signed)
 Patient made phone call to [facility]

## 2024-06-16 NOTE — Progress Notes (Signed)
 Inpatient Psychiatric Referral  Patient was recommended inpatient per Nash Batter, NP. There are no available beds at [facility], per [facility]. Patient was re-faxed to the following out of network facilities:  Destination  Service Provider Request Status Address Phone Fax  [facility]  Pending - Request Sent [address] [place] [address] [phone]  [facility]  Pending - Request Sent [address] [place] [phone]  [facility]  Pending - Request Sent [address] [place] [address] [phone]  [facility]  Pending - Request Sent [address] [place] [address] [phone]  [facility]  Pending - Request Sent [address] [place] [address] [phone]  [facility]  Pending - Request Sent [address] [place], [place] [address] [phone]  [facility]  Pending - Request Sent [address] [place] [address] [phone]  [facility]  Pending - Request Sent [address], [place] [address] [phone]    Situation ongoing, CSW to continue following and update chart as more information becomes available.   Harrie Sofia MSW, LCSWA 06/16/2024  8:40PM

## 2024-06-16 NOTE — Progress Notes (Signed)
 Pt has been accepted to [facility] for 06/16/2024 . Bed assignment:[facility]   Pt meets inpatient criteria per Nash Batter, NP   Attending Physician will be Dr. Regena Skiff   Report can be called to: - [phone]  Pt can arrive after 8AM   Care Team Notified:Nadine [place], RN, Judene Moats, RN

## 2024-06-17 NOTE — ED Notes (Signed)
IVC CURRENT

## 2024-06-17 NOTE — ED Notes (Signed)
 Report was given to Laymon Bunker, RN at 0900 from [facility]

## 2024-06-17 NOTE — ED Provider Notes (Signed)
 Emergency Medicine Observation Re-evaluation Note  Stephen Delacruz is a 68 y.o. male, seen on rounds today.  Pt initially presented to the ED for complaints of Fatigue Currently, the patient is awake and walking around the department.  Physical Exam  BP (!) 138/102 (BP Location: Left Arm)   Pulse 84   Temp 98.5 F (36.9 C)   Resp 18   SpO2 95%  Physical Exam General: No acute distress Cardiac: Well-perfused Lungs: Nonlabored Psych: Cooperative  ED Course / MDM  EKG:EKG Interpretation Date/Time:  Sunday June 15 2024 16:22:42 EDT Ventricular Rate:  95 PR Interval:  180 QRS Duration:  74 QT Interval:  362 QTC Calculation: 454 R Axis:   92  Text Interpretation: Normal sinus rhythm Anterolateral infarct , age undetermined Abnormal ECG When compared with ECG of 03-Sep-2023 15:28, No significant change since last tracing Confirmed by Towana Sharper [phone]) on 06/15/2024 4:24:13 PM  I have reviewed the labs performed to date as well as medications administered while in observation.  Recent changes in the last 24 hours include psychiatric evaluation.  Plan  Current plan is for admission to old [facility].  8:48 AM.  Patient has been accepted to old [facility].  Dr. Jess accepting physician.  Under an IVC so long enforcement will transport.  EMTALA filled out.    Towana Sharper BROCKS, MD 06/17/24 [phone]

## 2024-06-18 ENCOUNTER — Telehealth (HOSPITAL_COMMUNITY): Payer: Self-pay | Admitting: *Deleted

## 2024-06-18 NOTE — Telephone Encounter (Signed)
 Pt's POA, brother Kristoph Sattler, called to inform that pt has been IVC'd and will be going to [facility], currently holding at [facility] ED. Brother wanted to advise you that pt is extremely manic and is using cocaine again. Pt has been seen at [facility] by Dr. Marry and now has an appointment scheduled with you on 07/01/24. Brother wanted you to be advised and will reschedule appointment if needed.

## 2024-06-19 ENCOUNTER — Telehealth (HOSPITAL_COMMUNITY): Payer: Self-pay | Admitting: *Deleted

## 2024-06-19 NOTE — Telephone Encounter (Signed)
 Are you writing letter or would you like us  to do it?

## 2024-06-19 NOTE — Telephone Encounter (Signed)
 Writer received VM from pt's brother, Koron Godeaux, who is asking for a letter from you regarding pt financial status I.e that pt spends his disability monies on drugs, alcohol, and this impacts living circumstances, food insecurity. To change pt payee most likely to brother. Pt has not yet been seen by  you, his appointment is scheduled for 07/01/24. Pt currently under IVC and has been accepted to [facility]. FYI.

## 2024-06-23 ENCOUNTER — Encounter (HOSPITAL_COMMUNITY): Payer: Self-pay | Admitting: *Deleted

## 2024-06-23 ENCOUNTER — Telehealth (HOSPITAL_COMMUNITY): Payer: Self-pay | Admitting: *Deleted

## 2024-06-23 NOTE — Telephone Encounter (Signed)
 Opened in error

## 2024-06-23 NOTE — Telephone Encounter (Signed)
 I know. He's really sick. Will put letter in your box to be signed this afternoon. Thank you so much.
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
## 2024-06-23 NOTE — Telephone Encounter (Signed)
 Writer LVM for pt POA, [name], regarding letter for change in payee status. Brother advised that he may pick up letter later this afternoon or anytime Tue, Wed, or Thursday as we will be closed on Fri 06/27/24 in observance of national holiday.

## 2024-06-26 ENCOUNTER — Emergency Department (HOSPITAL_COMMUNITY)

## 2024-06-26 ENCOUNTER — Encounter (HOSPITAL_COMMUNITY): Payer: Self-pay | Admitting: Emergency Medicine

## 2024-06-26 ENCOUNTER — Emergency Department (HOSPITAL_COMMUNITY)
Admission: EM | Admit: 2024-06-26 | Discharge: 2024-06-27 | Disposition: A | Discharge status: Home / Self Care | Attending: Emergency Medicine | Admitting: Emergency Medicine

## 2024-06-26 ENCOUNTER — Other Ambulatory Visit: Payer: Self-pay

## 2024-06-26 DIAGNOSIS — E871 Hypo-osmolality and hyponatremia: Secondary | ICD-10-CM | POA: Diagnosis not present

## 2024-06-26 DIAGNOSIS — R945 Abnormal results of liver function studies: Secondary | ICD-10-CM | POA: Diagnosis not present

## 2024-06-26 DIAGNOSIS — N289 Disorder of kidney and ureter, unspecified: Secondary | ICD-10-CM

## 2024-06-26 DIAGNOSIS — F19120 Other psychoactive substance abuse with intoxication, uncomplicated: Secondary | ICD-10-CM | POA: Diagnosis not present

## 2024-06-26 DIAGNOSIS — D649 Anemia, unspecified: Secondary | ICD-10-CM

## 2024-06-26 DIAGNOSIS — R4182 Altered mental status, unspecified: Secondary | ICD-10-CM | POA: Diagnosis present

## 2024-06-26 DIAGNOSIS — R7401 Elevation of levels of liver transaminase levels: Secondary | ICD-10-CM

## 2024-06-26 DIAGNOSIS — F19920 Other psychoactive substance use, unspecified with intoxication, uncomplicated: Secondary | ICD-10-CM

## 2024-06-26 DIAGNOSIS — R600 Localized edema: Secondary | ICD-10-CM | POA: Insufficient documentation

## 2024-06-26 DIAGNOSIS — R748 Abnormal levels of other serum enzymes: Secondary | ICD-10-CM

## 2024-06-26 LAB — COMPREHENSIVE METABOLIC PANEL WITH GFR
ALT: 47 U/L — ABNORMAL HIGH (ref 0–44)
AST: 44 U/L — ABNORMAL HIGH (ref 15–41)
Albumin: 3.5 g/dL (ref 3.5–5.0)
Alkaline Phosphatase: 131 U/L — ABNORMAL HIGH (ref 38–126)
Anion gap: 8 (ref 5–15)
BUN: 16 mg/dL (ref 8–23)
CO2: 24 mmol/L (ref 22–32)
Calcium: 8.2 mg/dL — ABNORMAL LOW (ref 8.9–10.3)
Chloride: 97 mmol/L — ABNORMAL LOW (ref 98–111)
Creatinine, Ser: 1.39 mg/dL — ABNORMAL HIGH (ref 0.61–1.24)
GFR, Estimated: 56 mL/min — ABNORMAL LOW (ref >60)
Glucose, Bld: 92 mg/dL (ref 70–99)
Potassium: 4.6 mmol/L (ref 3.5–5.1)
Sodium: 129 mmol/L — ABNORMAL LOW (ref 135–145)
Total Bilirubin: 0.8 mg/dL (ref 0.0–1.2)
Total Protein: 6.5 g/dL (ref 6.5–8.1)

## 2024-06-26 LAB — CBC
HCT: 38.2 % — ABNORMAL LOW (ref 39.0–52.0)
Hemoglobin: 12.7 g/dL — ABNORMAL LOW (ref 13.0–17.0)
MCH: 32.2 pg (ref 26.0–34.0)
MCHC: 33.2 g/dL (ref 30.0–36.0)
MCV: 96.7 fL (ref 80.0–100.0)
Platelets: 372 10*3/uL (ref 150–400)
RBC: 3.95 MIL/uL — ABNORMAL LOW (ref 4.22–5.81)
RDW: 13.7 % (ref 11.5–15.5)
WBC: 13.1 10*3/uL — ABNORMAL HIGH (ref 4.0–10.5)
nRBC: 0 % (ref 0.0–0.2)

## 2024-06-26 LAB — RAPID URINE DRUG SCREEN, HOSP PERFORMED
Amphetamines: NOT DETECTED
Barbiturates: NOT DETECTED
Benzodiazepines: POSITIVE — AB
Cocaine: POSITIVE — AB
Opiates: NOT DETECTED
Tetrahydrocannabinol: POSITIVE — AB

## 2024-06-26 LAB — ETHANOL: Alcohol, Ethyl (B): <15 mg/dL (ref <15)

## 2024-06-26 LAB — ACETAMINOPHEN LEVEL: Acetaminophen (Tylenol), Serum: <10 ug/mL — ABNORMAL LOW (ref 10–30)

## 2024-06-26 MED ORDER — SODIUM CHLORIDE 0.9 % IV BOLUS
500.0000 mL | Freq: Once | INTRAVENOUS | Status: AC
  Administered 2024-06-26: 500 mL via INTRAVENOUS

## 2024-06-26 NOTE — ED Provider Notes (Addendum)
 [place] EMERGENCY DEPARTMENT AT [facility] Provider Note   CSN: [id number] Arrival date & time: 06/26/24  1840     Patient presents with: Altered Mental Status   Stephen Delacruz is a 68 y.o. male.    Altered Mental Status Patient was found by EMS on the ground.  Reportedly states he tripped.  Did hit head.  Somewhat somnolent.  Per EMS [place] of alcohol.  Did have recent psychiatric admission.  Patient states he has not been drinking but only taking his medicines.  States his left foot hurts.     Prior to Admission medications   Medication Sig Start Date End Date Taking? Authorizing Provider  amLODipine  (NORVASC ) 5 MG tablet Take 1 tablet (5 mg total) by mouth daily. 06/03/24   Newlin, Enobong, MD  atorvastatin  (LIPITOR ) 80 MG tablet Take 1 tablet (80 mg total) by mouth daily. 06/03/24   Newlin, Enobong, MD  carvedilol  (COREG ) 3.125 MG tablet Take 1 tablet (3.125 mg total) by mouth 2 (two) times daily with a meal. 06/03/24   Newlin, Corrina, MD  Cholecalciferol  (VITAMIN D3) 10 MCG (400 UNIT) tablet TAKE 1 TABLET BY MOUTH DAILY. 07/17/23   Newlin, Enobong, MD  gabapentin  (NEURONTIN ) 300 MG capsule Take 1 capsule (300 mg total) by mouth at bedtime. 05/21/24   Marry Clamp, MD  ibuprofen  (ADVIL ) 600 MG tablet Take 1 tablet (600 mg total) by mouth every 8 (eight) hours as needed. 06/03/24   Newlin, Enobong, MD  lidocaine  (LIDODERM ) 5 % Place 1 patch onto the skin daily. Remove & Discard patch within 12 hours or as directed by MD 06/03/24   Delbert Corrina, MD  OLANZapine  (ZYPREXA ) 5 MG tablet Take 1 tablet (5 mg total) by mouth at bedtime. 03/31/24 03/31/25  Marry Clamp, MD  OXcarbazepine  (TRILEPTAL ) 150 MG tablet Take 1 tablet (150 mg total) by mouth 2 (two) times daily. 03/31/24   Marry Clamp, MD  oxyCODONE -acetaminophen  (ENDOCET) 7.5-325 MG tablet Take 1 tablet by mouth every 4 (four) hours as needed for severe pain (pain score 7-10). 02/12/24   Zelaya, Oscar A, PA-C   sertraline  (ZOLOFT ) 50 MG tablet Take 1 tablet (50 mg total) by mouth daily. 03/31/24   Marry Clamp, MD  tiZANidine  (ZANAFLEX ) 4 MG tablet TAKE 1 TABLET BY MOUTH EVERY 8 HOURS AS NEEDED 05/23/24   Newlin, Enobong, MD  traZODone  (DESYREL ) 50 MG tablet Take 1 tablet (50 mg total) by mouth at bedtime as needed for sleep. 04/28/24   Marry Clamp, MD    Allergies: Prolixin  [fluphenazine ]    Review of Systems  Updated Vital Signs BP 115/64 (BP Location: Right Arm)   Pulse 88   Temp 98.3 F (36.8 C) (Axillary)   Resp 18   SpO2 97%   Physical Exam Vitals reviewed.  HENT:     Head:     Comments: No clear evidence of trauma. Cardiovascular:     Rate and Rhythm: Regular rhythm.  Pulmonary:     Breath sounds: No wheezing.  Abdominal:     Tenderness: There is no abdominal tenderness.  Musculoskeletal:     Cervical back: Neck supple. No tenderness.     Right lower leg: Edema present.     Left lower leg: Edema present.     Comments: Pitting edema bilateral lower extremities.  May have some tenderness over left foot but patient actually states it feels better when I press on it.  Neurological:     Comments: Awake but some somnolence.     (  all labs ordered are listed, but only abnormal results are displayed) Labs Reviewed  COMPREHENSIVE METABOLIC PANEL WITH GFR - Abnormal; Notable for the following components:      Result Value   Sodium 129 (*)    Chloride 97 (*)    Creatinine, Ser 1.39 (*)    Calcium  8.2 (*)    AST 44 (*)    ALT 47 (*)    Alkaline Phosphatase 131 (*)    GFR, Estimated 56 (*)    All other components within normal limits  ACETAMINOPHEN  LEVEL - Abnormal; Notable for the following components:   Acetaminophen  (Tylenol ), Serum <10 (*)    All other components within normal limits  CBC - Abnormal; Notable for the following components:   WBC 13.1 (*)    RBC 3.95 (*)    Hemoglobin 12.7 (*)    HCT 38.2 (*)    All other components within normal limits  RAPID URINE  DRUG SCREEN, HOSP PERFORMED - Abnormal; Notable for the following components:   Cocaine POSITIVE (*)    Benzodiazepines POSITIVE (*)    Tetrahydrocannabinol POSITIVE (*)    All other components within normal limits  ETHANOL    EKG: None  Radiology: CT HEAD WO CONTRAST ( ) Result Date: 06/26/2024 CLINICAL DATA:  Head trauma.  Found on ground by EMS. EXAM: CT HEAD WITHOUT CONTRAST TECHNIQUE: Contiguous axial images were obtained from the base of the skull through the vertex without intravenous contrast. RADIATION DOSE REDUCTION: This exam was performed according to the departmental dose-optimization program which includes automated exposure control, adjustment of the mA and/or kV according to patient size and/or use of iterative reconstruction technique. COMPARISON:  CT head 07/19/2022 FINDINGS: Brain: No intracranial hemorrhage, mass effect, or evidence of acute infarct. No hydrocephalus. No extra-axial fluid collection. Vascular: No hyperdense vessel or unexpected calcification. Skull: No fracture or focal lesion. Sinuses/Orbits: No acute finding. Other: None. IMPRESSION: No acute intracranial abnormality. Electronically Signed   By: Norman Gatlin M.D.   On: 06/26/2024 20:01   DG Foot Complete Left Result Date: 06/26/2024 CLINICAL DATA:  injury EXAM: LEFT FOOT - COMPLETE 3+ VIEW COMPARISON:  None Available. FINDINGS: No acute fracture or dislocation. There is no evidence of arthropathy or other focal bone abnormality. Moderate soft tissue swelling about the dorsum of the foot. IMPRESSION: Moderate soft tissue swelling about the dorsum of the foot. No acute fracture or dislocation. Electronically Signed   By: Rogelia Myers M.D.   On: 06/26/2024 19:46     Procedures   Medications Ordered in the ED  sodium chloride  0.9 % bolus 500 mL (500 mLs Intravenous New Bag/Given 06/26/24 2001)                                    Medical Decision Making Amount and/or Complexity of Data  Reviewed Labs: ordered. Radiology: ordered.   Patient with possible fall.  Left foot pain.  Reportedly was laying on the ground.  Does have psychiatric history.  Differential diagnosis includes traumatic injuries but also substance use.  Will get head CT foot x-ray and basic blood work.  Head CT and x-ray reassuring.  Negative alcohol.  However urine drug screen does show cocaine benzodiazepines and marijuana. Has mild hyponatremia.  Fluid boluses been given.  LFTs mildly elevated.  Will monitor for improvement of his mental status.   11:36 PM reevaluated and patient still sleeping soundly.  Care turned  over to Dr Raford     Final diagnoses:  Drug intoxication without complication [facility])    ED Discharge Orders     None          Patsey Lot, MD 06/26/24 [id number]    Patsey Lot, MD 06/26/24 [phone]

## 2024-06-26 NOTE — ED Triage Notes (Signed)
 Pt found by EMS on ground.  Pt not answering questions other than to say I am tired  Pt smells of ETOH.  Does follow commands.  VSS

## 2024-06-26 NOTE — ED Notes (Signed)
Patient transported to CT/xray 

## 2024-06-27 ENCOUNTER — Emergency Department (HOSPITAL_COMMUNITY)
Admission: EM | Admit: 2024-06-27 | Discharge: 2024-06-27 | Disposition: A | Discharge status: Home / Self Care | Attending: Emergency Medicine | Admitting: Emergency Medicine

## 2024-06-27 ENCOUNTER — Encounter (HOSPITAL_COMMUNITY): Payer: Self-pay

## 2024-06-27 ENCOUNTER — Emergency Department (HOSPITAL_COMMUNITY)

## 2024-06-27 DIAGNOSIS — E860 Dehydration: Secondary | ICD-10-CM | POA: Diagnosis present

## 2024-06-27 DIAGNOSIS — F1990 Other psychoactive substance use, unspecified, uncomplicated: Secondary | ICD-10-CM | POA: Diagnosis not present

## 2024-06-27 DIAGNOSIS — I10 Essential (primary) hypertension: Secondary | ICD-10-CM | POA: Insufficient documentation

## 2024-06-27 DIAGNOSIS — D72829 Elevated white blood cell count, unspecified: Secondary | ICD-10-CM | POA: Diagnosis not present

## 2024-06-27 DIAGNOSIS — R7309 Other abnormal glucose: Secondary | ICD-10-CM | POA: Diagnosis not present

## 2024-06-27 DIAGNOSIS — F1490 Cocaine use, unspecified, uncomplicated: Secondary | ICD-10-CM | POA: Diagnosis present

## 2024-06-27 DIAGNOSIS — R410 Disorientation, unspecified: Secondary | ICD-10-CM | POA: Diagnosis present

## 2024-06-27 DIAGNOSIS — Z79899 Other long term (current) drug therapy: Secondary | ICD-10-CM | POA: Diagnosis not present

## 2024-06-27 DIAGNOSIS — N179 Acute kidney failure, unspecified: Secondary | ICD-10-CM | POA: Insufficient documentation

## 2024-06-27 LAB — RAPID URINE DRUG SCREEN, HOSP PERFORMED
Amphetamines: NOT DETECTED
Barbiturates: NOT DETECTED
Benzodiazepines: NOT DETECTED
Cocaine: POSITIVE — AB
Opiates: NOT DETECTED
Tetrahydrocannabinol: POSITIVE — AB

## 2024-06-27 LAB — CBC WITH DIFFERENTIAL/PLATELET
Abs Immature Granulocytes: 0.12 K/uL — ABNORMAL HIGH (ref 0.00–0.07)
Basophils Absolute: 0 K/uL (ref 0.0–0.1)
Basophils Relative: 0 %
Eosinophils Absolute: 0.1 K/uL (ref 0.0–0.5)
Eosinophils Relative: 1 %
HCT: 37.6 % — ABNORMAL LOW (ref 39.0–52.0)
Hemoglobin: 11.9 g/dL — ABNORMAL LOW (ref 13.0–17.0)
Immature Granulocytes: 1 %
Lymphocytes Relative: 13 %
Lymphs Abs: 1.6 K/uL (ref 0.7–4.0)
MCH: 31.8 pg (ref 26.0–34.0)
MCHC: 31.6 g/dL (ref 30.0–36.0)
MCV: 100.5 fL — ABNORMAL HIGH (ref 80.0–100.0)
Monocytes Absolute: 1.3 K/uL — ABNORMAL HIGH (ref 0.1–1.0)
Monocytes Relative: 10 %
Neutro Abs: 9.1 K/uL — ABNORMAL HIGH (ref 1.7–7.7)
Neutrophils Relative %: 75 %
Platelets: 360 K/uL (ref 150–400)
RBC: 3.74 MIL/uL — ABNORMAL LOW (ref 4.22–5.81)
RDW: 14 % (ref 11.5–15.5)
WBC: 12.1 K/uL — ABNORMAL HIGH (ref 4.0–10.5)
nRBC: 0 % (ref 0.0–0.2)

## 2024-06-27 LAB — URINALYSIS, ROUTINE W REFLEX MICROSCOPIC
Bilirubin Urine: NEGATIVE
Glucose, UA: NEGATIVE mg/dL
Hgb urine dipstick: NEGATIVE
Ketones, ur: NEGATIVE mg/dL
Leukocytes,Ua: NEGATIVE
Nitrite: NEGATIVE
Protein, ur: NEGATIVE mg/dL
Specific Gravity, Urine: 1.004 — ABNORMAL LOW (ref 1.005–1.030)
pH: 6 (ref 5.0–8.0)

## 2024-06-27 LAB — TROPONIN I (HIGH SENSITIVITY)
Troponin I (High Sensitivity): 5 ng/L (ref <18)
Troponin I (High Sensitivity): 5 ng/L (ref <18)

## 2024-06-27 LAB — COMPREHENSIVE METABOLIC PANEL WITH GFR
ALT: 63 U/L — ABNORMAL HIGH (ref 0–44)
AST: 51 U/L — ABNORMAL HIGH (ref 15–41)
Albumin: 3.1 g/dL — ABNORMAL LOW (ref 3.5–5.0)
Alkaline Phosphatase: 129 U/L — ABNORMAL HIGH (ref 38–126)
Anion gap: 9 (ref 5–15)
BUN: 21 mg/dL (ref 8–23)
CO2: 24 mmol/L (ref 22–32)
Calcium: 8.9 mg/dL (ref 8.9–10.3)
Chloride: 104 mmol/L (ref 98–111)
Creatinine, Ser: 1.64 mg/dL — ABNORMAL HIGH (ref 0.61–1.24)
GFR, Estimated: 46 mL/min — ABNORMAL LOW (ref >60)
Glucose, Bld: 102 mg/dL — ABNORMAL HIGH (ref 70–99)
Potassium: 4.9 mmol/L (ref 3.5–5.1)
Sodium: 137 mmol/L (ref 135–145)
Total Bilirubin: 0.7 mg/dL (ref 0.0–1.2)
Total Protein: 5.7 g/dL — ABNORMAL LOW (ref 6.5–8.1)

## 2024-06-27 LAB — ACETAMINOPHEN LEVEL: Acetaminophen (Tylenol), Serum: <10 ug/mL — ABNORMAL LOW (ref 10–30)

## 2024-06-27 LAB — CK: Total CK: 452 U/L — ABNORMAL HIGH (ref 49–397)

## 2024-06-27 LAB — ETHANOL: Alcohol, Ethyl (B): <15 mg/dL (ref <15)

## 2024-06-27 LAB — I-STAT CG4 LACTIC ACID, ED: Lactic Acid, Venous: 1.4 mmol/L (ref 0.5–1.9)

## 2024-06-27 LAB — SALICYLATE LEVEL: Salicylate Lvl: <7 mg/dL — ABNORMAL LOW (ref 7.0–30.0)

## 2024-06-27 MED ORDER — LACTATED RINGERS IV BOLUS
1000.0000 mL | Freq: Once | INTRAVENOUS | Status: AC
  Administered 2024-06-27: 1000 mL via INTRAVENOUS

## 2024-06-27 NOTE — Discharge Instructions (Signed)
 You were seen here for evaluation. Please make sure that you are staying hydrated drinking plenty of fluids, mainly water .  I advised against drug use as this is likely with causing your symptoms.  Please make sure you follow with your primary care provider.  If you have any other concerns, new or worsening symptoms, please return to your nearest emergency department for evaluation.

## 2024-06-27 NOTE — ED Notes (Signed)
 Patient has all personal belongings including clothing, cell phone and glasses.

## 2024-06-27 NOTE — ED Triage Notes (Signed)
 Pt bib EMS from outside his apartment. 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 Pt was dc [facility] night. Pt was found laying outside of his apartment and was blacked out for 12 hrs. Pt reports no food or water  in the last 2 days. Pt aon 4 L  for O2  of 88%. Pt given 800 of LR for BP of 92/52 by EMS.

## 2024-06-27 NOTE — ED Provider Notes (Signed)
 Care assumed from Dr. Patsey, patient with altered mental status with positive drug screen for [facility] and benzodiazepines and cocaine.  He is being observed for return to baseline mental status.  2:28 AM Patient is awake and alert and oriented.  He is ambulatory.  He states that he did take some benzodiazepines which were self prescribed.  I suspect that this was an accidental overdose of benzodiazepines.  He is safe for discharge.   Raford Lenis, MD 06/27/24 [phone]

## 2024-06-27 NOTE — ED Provider Notes (Signed)
 [place] EMERGENCY DEPARTMENT AT [facility] Provider Note   CSN: [id number] Arrival date & time: 06/27/24  1133     Patient presents with: Dehydration   Stephen Delacruz is a 68 y.o. male.  {Add pertinent medical, surgical, social history, OB history to HPI:32947} HPI     Prior to Admission medications   Medication Sig Start Date End Date Taking? Authorizing Provider  amLODipine  (NORVASC ) 5 MG tablet Take 1 tablet (5 mg total) by mouth daily. 06/03/24   Newlin, Enobong, MD  atorvastatin  (LIPITOR ) 80 MG tablet Take 1 tablet (80 mg total) by mouth daily. 06/03/24   Newlin, Enobong, MD  carvedilol  (COREG ) 3.125 MG tablet Take 1 tablet (3.125 mg total) by mouth 2 (two) times daily with a meal. 06/03/24   Newlin, Corrina, MD  Cholecalciferol  (VITAMIN D3) 10 MCG (400 UNIT) tablet TAKE 1 TABLET BY MOUTH DAILY. 07/17/23   Newlin, Enobong, MD  gabapentin  (NEURONTIN ) 300 MG capsule Take 1 capsule (300 mg total) by mouth at bedtime. 05/21/24   Marry Clamp, MD  ibuprofen  (ADVIL ) 600 MG tablet Take 1 tablet (600 mg total) by mouth every 8 (eight) hours as needed. 06/03/24   Newlin, Enobong, MD  lidocaine  (LIDODERM ) 5 % Place 1 patch onto the skin daily. Remove & Discard patch within 12 hours or as directed by MD 06/03/24   Delbert Corrina, MD  OLANZapine  (ZYPREXA ) 5 MG tablet Take 1 tablet (5 mg total) by mouth at bedtime. 03/31/24 03/31/25  Marry Clamp, MD  OXcarbazepine  (TRILEPTAL ) 150 MG tablet Take 1 tablet (150 mg total) by mouth 2 (two) times daily. 03/31/24   Marry Clamp, MD  oxyCODONE -acetaminophen  (ENDOCET) 7.5-325 MG tablet Take 1 tablet by mouth every 4 (four) hours as needed for severe pain (pain score 7-10). 02/12/24   Zelaya, Oscar A, PA-C  sertraline  (ZOLOFT ) 50 MG tablet Take 1 tablet (50 mg total) by mouth daily. 03/31/24   Marry Clamp, MD  tiZANidine  (ZANAFLEX ) 4 MG tablet TAKE 1 TABLET BY MOUTH EVERY 8 HOURS AS NEEDED 05/23/24   Newlin, Enobong, MD  traZODone  (DESYREL )  50 MG tablet Take 1 tablet (50 mg total) by mouth at bedtime as needed for sleep. 04/28/24   Marry Clamp, MD    Allergies: Prolixin  [fluphenazine ]    Review of Systems  Updated Vital Signs BP 123/65   Pulse 95   Temp 98.7 F (37.1 C) (Oral)   Resp (!) 21   Ht 5' 6 (1.676 m)   Wt 87.2 kg   SpO2 91%   BMI 31.03 kg/m   Physical Exam  (all labs ordered are listed, but only abnormal results are displayed) Labs Reviewed  CBC WITH DIFFERENTIAL/PLATELET - Abnormal; Notable for the following components:      Result Value   WBC 12.1 (*)    RBC 3.74 (*)    Hemoglobin 11.9 (*)    HCT 37.6 (*)    MCV 100.5 (*)    Neutro Abs 9.1 (*)    Monocytes Absolute 1.3 (*)    Abs Immature Granulocytes 0.12 (*)    All other components within normal limits  COMPREHENSIVE METABOLIC PANEL WITH GFR - Abnormal; Notable for the following components:   Glucose, Bld 102 (*)    Creatinine, Ser 1.64 (*)    Total Protein 5.7 (*)    Albumin 3.1 (*)    AST 51 (*)    ALT 63 (*)    Alkaline Phosphatase 129 (*)    GFR, Estimated 46 (*)  All other components within normal limits  URINALYSIS, ROUTINE W REFLEX MICROSCOPIC - Abnormal; Notable for the following components:   Color, Urine STRAW (*)    Specific Gravity, Urine 1.004 (*)    All other components within normal limits  CK - Abnormal; Notable for the following components:   Total CK 452 (*)    All other components within normal limits  RAPID URINE DRUG SCREEN, HOSP PERFORMED - Abnormal; Notable for the following components:   Cocaine POSITIVE (*)    Tetrahydrocannabinol POSITIVE (*)    All other components within normal limits  ACETAMINOPHEN  LEVEL - Abnormal; Notable for the following components:   Acetaminophen  (Tylenol ), Serum <10 (*)    All other components within normal limits  SALICYLATE LEVEL - Abnormal; Notable for the following components:   Salicylate Lvl <7.0 (*)    All other components within normal limits  ETHANOL  I-STAT CG4  LACTIC ACID, ED  TROPONIN I (HIGH SENSITIVITY)  TROPONIN I (HIGH SENSITIVITY)    EKG: EKG Interpretation Date/Time:  Friday June 27 2024 11:54:51 EDT Ventricular Rate:  92 PR Interval:  188 QRS Duration:  90 QT Interval:  355 QTC Calculation: 440 R Axis:   73  Text Interpretation: Sinus rhythm Confirmed by Randol Simmonds [phone]) on 06/27/2024 12:48:55 PM  Radiology: CT Head Wo Contrast Result Date: 06/27/2024 CLINICAL DATA:  Patient was found down. EXAM: CT HEAD WITHOUT CONTRAST CT CERVICAL SPINE WITHOUT CONTRAST TECHNIQUE: Multidetector CT imaging of the head and cervical spine was performed following the standard protocol without intravenous contrast. Multiplanar CT image reconstructions of the cervical spine were also generated. RADIATION DOSE REDUCTION: This exam was performed according to the departmental dose-optimization program which includes automated exposure control, adjustment of the mA and/or kV according to patient size and/or use of iterative reconstruction technique. COMPARISON:  06/26/2024 FINDINGS: CT HEAD FINDINGS Brain: There is no evidence for acute hemorrhage, hydrocephalus, mass lesion, or abnormal extra-axial fluid collection. No definite CT evidence for acute infarction. Vascular: No hyperdense vessel or unexpected calcification. Skull: No evidence for fracture. No worrisome lytic or sclerotic lesion. Sinuses/Orbits: Visualized portions of the globes and intraorbital fat are unremarkable. The visualized paranasal sinuses and mastoid air cells are clear. Other: None. CT CERVICAL SPINE FINDINGS Alignment: Reversal of normal cervical lordosis evident. Skull base and vertebrae: No acute fracture. No primary bone lesion or focal pathologic process. Soft tissues and spinal canal: No prevertebral fluid or swelling. No visible canal hematoma. Disc levels: Marked loss of disc height noted C5-6 with endplate degeneration. Facets are well aligned bilaterally. Upper chest: Unremarkable.  Other: None. IMPRESSION: 1. No acute intracranial abnormality. 2. Reversal of normal cervical lordosis with degenerative changes in the cervical spine. No evidence for cervical spine fracture. Electronically Signed   By: Camellia Candle M.D.   On: 06/27/2024 13:34   CT Cervical Spine Wo Contrast Result Date: 06/27/2024 CLINICAL DATA:  Patient was found down. EXAM: CT HEAD WITHOUT CONTRAST CT CERVICAL SPINE WITHOUT CONTRAST TECHNIQUE: Multidetector CT imaging of the head and cervical spine was performed following the standard protocol without intravenous contrast. Multiplanar CT image reconstructions of the cervical spine were also generated. RADIATION DOSE REDUCTION: This exam was performed according to the departmental dose-optimization program which includes automated exposure control, adjustment of the mA and/or kV according to patient size and/or use of iterative reconstruction technique. COMPARISON:  06/26/2024 FINDINGS: CT HEAD FINDINGS Brain: There is no evidence for acute hemorrhage, hydrocephalus, mass lesion, or abnormal extra-axial fluid collection. No  definite CT evidence for acute infarction. Vascular: No hyperdense vessel or unexpected calcification. Skull: No evidence for fracture. No worrisome lytic or sclerotic lesion. Sinuses/Orbits: Visualized portions of the globes and intraorbital fat are unremarkable. The visualized paranasal sinuses and mastoid air cells are clear. Other: None. CT CERVICAL SPINE FINDINGS Alignment: Reversal of normal cervical lordosis evident. Skull base and vertebrae: No acute fracture. No primary bone lesion or focal pathologic process. Soft tissues and spinal canal: No prevertebral fluid or swelling. No visible canal hematoma. Disc levels: Marked loss of disc height noted C5-6 with endplate degeneration. Facets are well aligned bilaterally. Upper chest: Unremarkable. Other: None. IMPRESSION: 1. No acute intracranial abnormality. 2. Reversal of normal cervical lordosis with  degenerative changes in the cervical spine. No evidence for cervical spine fracture. Electronically Signed   By: Camellia Candle M.D.   On: 06/27/2024 13:34   DG Chest 2 View Result Date: 06/27/2024 CLINICAL DATA:  Hypoxia. EXAM: CHEST - 2 VIEW COMPARISON:  12/13/2022 FINDINGS: Low volume film with asymmetric elevation right hemidiaphragm. Retrocardiac atelectasis or infiltrate noted. The cardiopericardial silhouette is within normal limits for size. No acute bony abnormality. Telemetry leads overlie the chest. IMPRESSION: Low volume film with retrocardiac atelectasis or infiltrate. Electronically Signed   By: Camellia Candle M.D.   On: 06/27/2024 12:38   CT HEAD WO CONTRAST ( ) Result Date: 06/26/2024 CLINICAL DATA:  Head trauma.  Found on ground by EMS. EXAM: CT HEAD WITHOUT CONTRAST TECHNIQUE: Contiguous axial images were obtained from the base of the skull through the vertex without intravenous contrast. RADIATION DOSE REDUCTION: This exam was performed according to the departmental dose-optimization program which includes automated exposure control, adjustment of the mA and/or kV according to patient size and/or use of iterative reconstruction technique. COMPARISON:  CT head 07/19/2022 FINDINGS: Brain: No intracranial hemorrhage, mass effect, or evidence of acute infarct. No hydrocephalus. No extra-axial fluid collection. Vascular: No hyperdense vessel or unexpected calcification. Skull: No fracture or focal lesion. Sinuses/Orbits: No acute finding. Other: None. IMPRESSION: No acute intracranial abnormality. Electronically Signed   By: Norman Gatlin M.D.   On: 06/26/2024 20:01   DG Foot Complete Left Result Date: 06/26/2024 CLINICAL DATA:  injury EXAM: LEFT FOOT - COMPLETE 3+ VIEW COMPARISON:  None Available. FINDINGS: No acute fracture or dislocation. There is no evidence of arthropathy or other focal bone abnormality. Moderate soft tissue swelling about the dorsum of the foot. IMPRESSION: Moderate soft  tissue swelling about the dorsum of the foot. No acute fracture or dislocation. Electronically Signed   By: Rogelia Myers M.D.   On: 06/26/2024 19:46    {Document cardiac monitor, telemetry assessment procedure when appropriate:32947} Procedures   Medications Ordered in the ED  lactated ringers  bolus 1,000 mL (0 mLs Intravenous Stopped 06/27/24 1418)  lactated ringers  bolus 1,000 mL (0 mLs Intravenous Stopped 06/27/24 1611)    Clinical Course as of 06/27/24 1844  Fri Jun 27, 2024  1233 Patient was taken off oxygen and has been maintaining saturation os 96-98% on room air [RR]  1502 On re-evaluation, patient has no other complaints.  Resting comfortably awakens easily to voice.  Blood pressure systolically 115.  Will continue to give fluids.  His only complaint is hunger. [RR]    Clinical Course User Index [RR] Bernis Ernst, PA-C   {Click here for ABCD2, HEART and other calculators REFRESH Note before signing:1}  Medical Decision Making Amount and/or Complexity of Data Reviewed Labs: ordered. Radiology: ordered.   ***  {Document critical care time when appropriate  Document review of labs and clinical decision tools ie [id number], etc  Document your independent review of radiology images and any outside records  Document your discussion with family members, caretakers and with consultants  Document social determinants of health affecting pt's care  Document your decision making why or why not admission, treatments were needed:32947:::1}   Final diagnoses:  None    ED Discharge Orders     None

## 2024-06-27 NOTE — Discharge Instructions (Signed)
 Do not take any medication that is not prescribed to you by another physician or medical provider.

## 2024-06-27 NOTE — ED Notes (Signed)
 Pt ambulated to restroom without assistance, changed out into dry paper scrubs

## 2024-06-27 NOTE — ED Notes (Signed)
 Pt awake and able to state name, place and situation

## 2024-06-28 ENCOUNTER — Other Ambulatory Visit: Payer: Self-pay

## 2024-06-28 ENCOUNTER — Encounter (HOSPITAL_COMMUNITY): Payer: Self-pay

## 2024-06-28 ENCOUNTER — Emergency Department (HOSPITAL_COMMUNITY)
Admission: EM | Admit: 2024-06-28 | Discharge: 2024-06-29 | Disposition: A | Discharge status: Home / Self Care | Attending: Emergency Medicine | Admitting: Emergency Medicine

## 2024-06-28 DIAGNOSIS — R7401 Elevation of levels of liver transaminase levels: Secondary | ICD-10-CM | POA: Diagnosis not present

## 2024-06-28 DIAGNOSIS — M549 Dorsalgia, unspecified: Secondary | ICD-10-CM | POA: Diagnosis not present

## 2024-06-28 DIAGNOSIS — B029 Zoster without complications: Secondary | ICD-10-CM | POA: Diagnosis not present

## 2024-06-28 DIAGNOSIS — F1910 Other psychoactive substance abuse, uncomplicated: Secondary | ICD-10-CM | POA: Diagnosis not present

## 2024-06-28 DIAGNOSIS — M545 Low back pain, unspecified: Secondary | ICD-10-CM | POA: Diagnosis present

## 2024-06-28 DIAGNOSIS — Y900 Blood alcohol level of less than 20 mg/100 ml: Secondary | ICD-10-CM | POA: Diagnosis not present

## 2024-06-28 DIAGNOSIS — F10120 Alcohol abuse with intoxication, uncomplicated: Secondary | ICD-10-CM | POA: Diagnosis present

## 2024-06-28 DIAGNOSIS — I10 Essential (primary) hypertension: Secondary | ICD-10-CM | POA: Diagnosis not present

## 2024-06-28 DIAGNOSIS — Z79899 Other long term (current) drug therapy: Secondary | ICD-10-CM | POA: Diagnosis not present

## 2024-06-28 LAB — ETHANOL: Alcohol, Ethyl (B): <15 mg/dL (ref <15)

## 2024-06-28 LAB — COMPREHENSIVE METABOLIC PANEL WITH GFR
ALT: 45 U/L — ABNORMAL HIGH (ref 0–44)
AST: 37 U/L (ref 15–41)
Albumin: 3.3 g/dL — ABNORMAL LOW (ref 3.5–5.0)
Alkaline Phosphatase: 116 U/L (ref 38–126)
Anion gap: 8 (ref 5–15)
BUN: 29 mg/dL — ABNORMAL HIGH (ref 8–23)
CO2: 23 mmol/L (ref 22–32)
Calcium: 8.2 mg/dL — ABNORMAL LOW (ref 8.9–10.3)
Chloride: 106 mmol/L (ref 98–111)
Creatinine, Ser: 1.08 mg/dL (ref 0.61–1.24)
GFR, Estimated: >60 mL/min (ref >60)
Glucose, Bld: 76 mg/dL (ref 70–99)
Potassium: 3.4 mmol/L — ABNORMAL LOW (ref 3.5–5.1)
Sodium: 137 mmol/L (ref 135–145)
Total Bilirubin: 0.7 mg/dL (ref 0.0–1.2)
Total Protein: 5.7 g/dL — ABNORMAL LOW (ref 6.5–8.1)

## 2024-06-28 LAB — RAPID URINE DRUG SCREEN, HOSP PERFORMED
Amphetamines: NOT DETECTED
Barbiturates: NOT DETECTED
Benzodiazepines: POSITIVE — AB
Cocaine: POSITIVE — AB
Opiates: NOT DETECTED
Tetrahydrocannabinol: POSITIVE — AB

## 2024-06-28 LAB — CBG MONITORING, ED: Glucose-Capillary: 83 mg/dL (ref 70–99)

## 2024-06-28 NOTE — ED Provider Notes (Signed)
 [place] EMERGENCY DEPARTMENT AT [facility] Provider Note   CSN: [id number] Arrival date & time: 06/28/24  8092     Patient presents with: Alcohol Intoxication   Stephen Delacruz is a 68 y.o. male.   Alcohol Intoxication  Patient is a [age]-year-old male presents the ED today for concerns for altered and was has and possible alcohol intoxication, brought in by EMS smelling of alcohol from gas station, with bystander reporting him acting strangely.  Patient states that he was not at a gas station and has been uncooperative in providing further information.  Reports generalized back pain which has been present for last 20 years and not new.  He wishes to be able to stay here and not leave.  Does not wish to answer orientation questions outside of providing his name.  He is resting well currently but says that he has been having multiple symptoms when asked during ROS.  Suspect that this may be inaccurate and patient may be malingering.  Previous medical history of SI, cocaine abuse, alcohol abuse, cannabis abuse, bipolar 1, PTSD.      Prior to Admission medications   Medication Sig Start Date End Date Taking? Authorizing Provider  amLODipine  (NORVASC ) 5 MG tablet Take 1 tablet (5 mg total) by mouth daily. 06/03/24   Newlin, Enobong, MD  atorvastatin  (LIPITOR ) 80 MG tablet Take 1 tablet (80 mg total) by mouth daily. 06/03/24   Newlin, Enobong, MD  carvedilol  (COREG ) 3.125 MG tablet Take 1 tablet (3.125 mg total) by mouth 2 (two) times daily with a meal. 06/03/24   Delbert Clam, MD  Cholecalciferol  (VITAMIN D3) 10 MCG (400 UNIT) tablet TAKE 1 TABLET BY MOUTH DAILY. 07/17/23   Newlin, Enobong, MD  gabapentin  (NEURONTIN ) 300 MG capsule Take 1 capsule (300 mg total) by mouth at bedtime. 05/21/24   Marry Clamp, MD  ibuprofen  (ADVIL ) 600 MG tablet Take 1 tablet (600 mg total) by mouth every 8 (eight) hours as needed. 06/03/24   Newlin, Enobong, MD  lidocaine  (LIDODERM ) 5 % Place  1 patch onto the skin daily. Remove & Discard patch within 12 hours or as directed by MD 06/03/24   Delbert Clam, MD  OLANZapine  (ZYPREXA ) 5 MG tablet Take 1 tablet (5 mg total) by mouth at bedtime. 03/31/24 03/31/25  Marry Clamp, MD  OXcarbazepine  (TRILEPTAL ) 150 MG tablet Take 1 tablet (150 mg total) by mouth 2 (two) times daily. 03/31/24   Marry Clamp, MD  oxyCODONE -acetaminophen  (ENDOCET) 7.5-325 MG tablet Take 1 tablet by mouth every 4 (four) hours as needed for severe pain (pain score 7-10). 02/12/24   Zelaya, Oscar A, PA-C  sertraline  (ZOLOFT ) 50 MG tablet Take 1 tablet (50 mg total) by mouth daily. 03/31/24   Marry Clamp, MD  tiZANidine  (ZANAFLEX ) 4 MG tablet TAKE 1 TABLET BY MOUTH EVERY 8 HOURS AS NEEDED 05/23/24   Newlin, Enobong, MD  traZODone  (DESYREL ) 50 MG tablet Take 1 tablet (50 mg total) by mouth at bedtime as needed for sleep. 04/28/24   Marry Clamp, MD    Allergies: Prolixin  [fluphenazine ]    Review of Systems  Updated Vital Signs BP 114/63   Pulse 94   Temp 97.8 F (36.6 C) (Oral)   Resp 16   Ht 5' 6 (1.676 m)   Wt 87.2 kg   SpO2 92%   BMI 31.03 kg/m   Physical Exam  (all labs ordered are listed, but only abnormal results are displayed) Labs Reviewed  COMPREHENSIVE METABOLIC PANEL WITH GFR  ETHANOL  CBC  RAPID URINE DRUG SCREEN, HOSP PERFORMED  CBG MONITORING, ED    EKG: None  Radiology: CT Head Wo Contrast Result Date: 06/27/2024 CLINICAL DATA:  Patient was found down. EXAM: CT HEAD WITHOUT CONTRAST CT CERVICAL SPINE WITHOUT CONTRAST TECHNIQUE: Multidetector CT imaging of the head and cervical spine was performed following the standard protocol without intravenous contrast. Multiplanar CT image reconstructions of the cervical spine were also generated. RADIATION DOSE REDUCTION: This exam was performed according to the departmental dose-optimization program which includes automated exposure control, adjustment of the mA and/or kV according to patient  size and/or use of iterative reconstruction technique. COMPARISON:  06/26/2024 FINDINGS: CT HEAD FINDINGS Brain: There is no evidence for acute hemorrhage, hydrocephalus, mass lesion, or abnormal extra-axial fluid collection. No definite CT evidence for acute infarction. Vascular: No hyperdense vessel or unexpected calcification. Skull: No evidence for fracture. No worrisome lytic or sclerotic lesion. Sinuses/Orbits: Visualized portions of the globes and intraorbital fat are unremarkable. The visualized paranasal sinuses and mastoid air cells are clear. Other: None. CT CERVICAL SPINE FINDINGS Alignment: Reversal of normal cervical lordosis evident. Skull base and vertebrae: No acute fracture. No primary bone lesion or focal pathologic process. Soft tissues and spinal canal: No prevertebral fluid or swelling. No visible canal hematoma. Disc levels: Marked loss of disc height noted C5-6 with endplate degeneration. Facets are well aligned bilaterally. Upper chest: Unremarkable. Other: None. IMPRESSION: 1. No acute intracranial abnormality. 2. Reversal of normal cervical lordosis with degenerative changes in the cervical spine. No evidence for cervical spine fracture. Electronically Signed   By: Camellia Candle M.D.   On: 06/27/2024 13:34   CT Cervical Spine Wo Contrast Result Date: 06/27/2024 CLINICAL DATA:  Patient was found down. EXAM: CT HEAD WITHOUT CONTRAST CT CERVICAL SPINE WITHOUT CONTRAST TECHNIQUE: Multidetector CT imaging of the head and cervical spine was performed following the standard protocol without intravenous contrast. Multiplanar CT image reconstructions of the cervical spine were also generated. RADIATION DOSE REDUCTION: This exam was performed according to the departmental dose-optimization program which includes automated exposure control, adjustment of the mA and/or kV according to patient size and/or use of iterative reconstruction technique. COMPARISON:  06/26/2024 FINDINGS: CT HEAD FINDINGS  Brain: There is no evidence for acute hemorrhage, hydrocephalus, mass lesion, or abnormal extra-axial fluid collection. No definite CT evidence for acute infarction. Vascular: No hyperdense vessel or unexpected calcification. Skull: No evidence for fracture. No worrisome lytic or sclerotic lesion. Sinuses/Orbits: Visualized portions of the globes and intraorbital fat are unremarkable. The visualized paranasal sinuses and mastoid air cells are clear. Other: None. CT CERVICAL SPINE FINDINGS Alignment: Reversal of normal cervical lordosis evident. Skull base and vertebrae: No acute fracture. No primary bone lesion or focal pathologic process. Soft tissues and spinal canal: No prevertebral fluid or swelling. No visible canal hematoma. Disc levels: Marked loss of disc height noted C5-6 with endplate degeneration. Facets are well aligned bilaterally. Upper chest: Unremarkable. Other: None. IMPRESSION: 1. No acute intracranial abnormality. 2. Reversal of normal cervical lordosis with degenerative changes in the cervical spine. No evidence for cervical spine fracture. Electronically Signed   By: Camellia Candle M.D.   On: 06/27/2024 13:34   DG Chest 2 View Result Date: 06/27/2024 CLINICAL DATA:  Hypoxia. EXAM: CHEST - 2 VIEW COMPARISON:  12/13/2022 FINDINGS: Low volume film with asymmetric elevation right hemidiaphragm. Retrocardiac atelectasis or infiltrate noted. The cardiopericardial silhouette is within normal limits for size. No acute bony abnormality. Telemetry leads overlie the chest. IMPRESSION:  Low volume film with retrocardiac atelectasis or infiltrate. Electronically Signed   By: Camellia Candle M.D.   On: 06/27/2024 12:38    {Document cardiac monitor, telemetry assessment procedure when appropriate:32947} Procedures   Medications Ordered in the ED - No data to display    {Click here for ABCD2, HEART and other calculators REFRESH Note before signing:1}                              Medical Decision  Making Amount and/or Complexity of Data Reviewed Labs: ordered.   This patient is a ***  who presents to the ED for concern of ***.   Differential diagnoses prior to evaluation: The emergent differential diagnosis includes, but is not limited to,  *** . This is not an exhaustive differential.   Past Medical History / Co-morbidities / Social History: ***  Additional history: Chart reviewed. Pertinent results include:  Seen yesterday for AKI, reported that he had taken LSD 2 days ago.  Had been noted to been laying outside apartment and was blacked out for 12 hours.  Seen 2 days ago for altered status with likely drug intoxication, positive for THC and benzodiazepines that time and cocaine.  Return to baseline status and discharge.  Lab Tests/Imaging studies: I personally interpreted labs/imaging and the pertinent results include:  ***.   ***I agree with the radiologist interpretation.  Cardiac monitoring: EKG obtained and interpreted by myself and attending physician which shows: ***   Medications: I ordered medication including ***.  I have reviewed the patients home medicines and have made adjustments as needed.  Critical Interventions:  Social Determinants of Health:  Disposition: After consideration of the diagnostic results and the patients response to treatment, I feel that the patient would benefit from ***.   ***emergency department workup does not suggest an emergent condition requiring admission or immediate intervention beyond what has been performed at this time. The plan is: ***. The patient is safe for discharge and has been instructed to return immediately for worsening symptoms, change in symptoms or any other concerns.   {Document critical care time when appropriate  Document review of labs and clinical decision tools ie CHADS2VASC2, etc  Document your independent review of radiology images and any outside records  Document your discussion with family  members, caretakers and with consultants  Document social determinants of health affecting pt's care  Document your decision making why or why not admission, treatments were needed:32947:::1}   Final diagnoses:  None    ED Discharge Orders     None

## 2024-06-28 NOTE — ED Provider Triage Note (Signed)
 Emergency Medicine Provider Triage Evaluation Note  Marven Veley , a 68 y.o. male  was evaluated in triage.  Pt complains  altered mental status starting today, noting to be acting funny at gas station. Brought in by EMS.   Pt is complaining of back pain x 20 years.   Pt states he took marijuana edibles, lost count of how many.   Denies alcohol use.   Review of Systems  Positive: See above Negative: See above  Physical Exam  BP 114/63   Pulse 94   Temp 97.8 F (36.6 C) (Oral)   Resp 16   Ht 5' 6 (1.676 m)   Wt 87.2 kg   SpO2 92%   BMI 31.03 kg/m  Gen:   Awake, no distress   Resp:  Normal effort  MSK:   Moves extremities without difficulty  Other:    Medical Decision Making  Medically screening exam initiated at 7:37 PM.  Appropriate orders placed.  Izrael Peak was informed that the remainder of the evaluation will be completed by another provider, this initial triage assessment does not replace that evaluation, and the importance of remaining in the ED until their evaluation is complete.     Beola Terrall RAMAN, [place] 06/28/24 1941

## 2024-06-28 NOTE — ED Notes (Signed)
 Attempted to draw blood from pt, pt jumped and needle came out.

## 2024-06-28 NOTE — ED Triage Notes (Signed)
 Pt BIB [facility] from gas station. Bystander called EMS because patient was walking around acting funny. Pt refuses to answer orientation questions. Pt's only complaint is that he is hot.

## 2024-06-29 ENCOUNTER — Other Ambulatory Visit: Payer: Self-pay

## 2024-06-29 ENCOUNTER — Emergency Department (HOSPITAL_COMMUNITY)
Admission: EM | Admit: 2024-06-29 | Discharge: 2024-06-29 | Disposition: A | Discharge status: Home / Self Care | Source: Home / Self Care | Attending: Emergency Medicine | Admitting: Emergency Medicine

## 2024-06-29 DIAGNOSIS — Z79899 Other long term (current) drug therapy: Secondary | ICD-10-CM | POA: Insufficient documentation

## 2024-06-29 DIAGNOSIS — B029 Zoster without complications: Secondary | ICD-10-CM | POA: Insufficient documentation

## 2024-06-29 DIAGNOSIS — I10 Essential (primary) hypertension: Secondary | ICD-10-CM | POA: Insufficient documentation

## 2024-06-29 MED ORDER — ACYCLOVIR 400 MG PO TABS: 400.0000 mg | ORAL_TABLET | Freq: Four times a day (QID) | ORAL | 0 refills | Status: AC

## 2024-06-29 NOTE — Discharge Instructions (Addendum)
 Your back pain is likely due to shingles.  It appears that the rash is improving and you may not necessarily need to start on antiviral medication, acyclovir  that I am prescribing.  However, if your rash return or if your back pain worsen please take medication prescribed and follow-up with your doctor for further care.

## 2024-06-29 NOTE — ED Triage Notes (Signed)
 Pt brought to ED from [facility] across the street by PD. Pt was found getting into a vehicle that did not belong to him. Pt answers all questions appropriately, but his story is unclear. Pt states he does not know why he was getting in the vehicle, but denies trying to steel it. No charges were pressed and the pt stated that he needed to go the ED for back pain. Pt recently seen for polysubstance abuse.

## 2024-06-29 NOTE — ED Provider Notes (Signed)
 [place] EMERGENCY DEPARTMENT AT [facility] Provider Note   CSN: [id number] Arrival date & time: 06/29/24  9255     Patient presents with: Altered Mental Status and Back Pain   Gareth Fitzner is a 68 y.o. male.   The history is provided by the patient, medical records and the police. No language interpreter was used.  Altered Mental Status Associated symptoms: no fever   Back Pain Associated symptoms: no fever      68 year old male presenting history of bipolar disorder, PTSD, anxiety, polysubstance use, hypertension, brought here from behavioral health accompanied by [employer] with complaint of back pain.  Per GP note, patient was found get into a vehicle that did not belong to him.  Patient states he does not know why he got into the vehicle but denies trying to steal it.  He is currently complaining of back pain.  Patient unsure how long he has had his back pain.  Pain is primarily to the right lower back.  Nothing seems to make it better or worse.  Denies any bowel bladder incontinence or saddle anesthesia.  No history of IV drug use.  Prior to Admission medications   Medication Sig Start Date End Date Taking? Authorizing Provider  amLODipine  (NORVASC ) 5 MG tablet Take 1 tablet (5 mg total) by mouth daily. 06/03/24   Newlin, Enobong, MD  atorvastatin  (LIPITOR ) 80 MG tablet Take 1 tablet (80 mg total) by mouth daily. 06/03/24   Newlin, Enobong, MD  carvedilol  (COREG ) 3.125 MG tablet Take 1 tablet (3.125 mg total) by mouth 2 (two) times daily with a meal. 06/03/24   Delbert Clam, MD  Cholecalciferol  (VITAMIN D3) 10 MCG (400 UNIT) tablet TAKE 1 TABLET BY MOUTH DAILY. 07/17/23   Newlin, Enobong, MD  gabapentin  (NEURONTIN ) 300 MG capsule Take 1 capsule (300 mg total) by mouth at bedtime. 05/21/24   Marry Clamp, MD  ibuprofen  (ADVIL ) 600 MG tablet Take 1 tablet (600 mg total) by mouth every 8 (eight) hours as needed. 06/03/24   Newlin, Enobong, MD  lidocaine  (LIDODERM ) 5 % Place  1 patch onto the skin daily. Remove & Discard patch within 12 hours or as directed by MD 06/03/24   Delbert Clam, MD  OLANZapine  (ZYPREXA ) 5 MG tablet Take 1 tablet (5 mg total) by mouth at bedtime. 03/31/24 03/31/25  Marry Clamp, MD  OXcarbazepine  (TRILEPTAL ) 150 MG tablet Take 1 tablet (150 mg total) by mouth 2 (two) times daily. 03/31/24   Marry Clamp, MD  oxyCODONE -acetaminophen  (ENDOCET) 7.5-325 MG tablet Take 1 tablet by mouth every 4 (four) hours as needed for severe pain (pain score 7-10). 02/12/24   Zelaya, Oscar A, PA-C  sertraline  (ZOLOFT ) 50 MG tablet Take 1 tablet (50 mg total) by mouth daily. 03/31/24   Marry Clamp, MD  tiZANidine  (ZANAFLEX ) 4 MG tablet TAKE 1 TABLET BY MOUTH EVERY 8 HOURS AS NEEDED 05/23/24   Newlin, Enobong, MD  traZODone  (DESYREL ) 50 MG tablet Take 1 tablet (50 mg total) by mouth at bedtime as needed for sleep. 04/28/24   Marry Clamp, MD    Allergies: Prolixin  [fluphenazine ]    Review of Systems  Constitutional:  Negative for fever.  Musculoskeletal:  Positive for back pain.    Updated Vital Signs BP 136/86 (BP Location: Left Arm)   Pulse 71   Temp 97.7 F (36.5 C) (Oral)   Resp 16   SpO2 99%   Physical Exam Constitutional:      General: He is not in acute  distress.    Appearance: He is well-developed.     Comments: Laying in bed sleeping easily arousable appears to be in no acute discomfort.  HENT:     Head: Atraumatic.  Eyes:     Conjunctiva/sclera: Conjunctivae normal.  Abdominal:     Palpations: Abdomen is soft.     Tenderness: There is no abdominal tenderness.  Musculoskeletal:        General: Tenderness (No significant midline spine tenderness however patient has a vesicular-like rash that has healed over on the left lumbar's paraspinal region with some tenderness to palpation) present.     Cervical back: Normal range of motion and neck supple.  Skin:    Findings: No rash.  Neurological:     Mental Status: He is alert.      (all labs ordered are listed, but only abnormal results are displayed) Labs Reviewed - No data to display  EKG: None  Radiology: CT Head Wo Contrast Result Date: 06/27/2024 CLINICAL DATA:  Patient was found down. EXAM: CT HEAD WITHOUT CONTRAST CT CERVICAL SPINE WITHOUT CONTRAST TECHNIQUE: Multidetector CT imaging of the head and cervical spine was performed following the standard protocol without intravenous contrast. Multiplanar CT image reconstructions of the cervical spine were also generated. RADIATION DOSE REDUCTION: This exam was performed according to the departmental dose-optimization program which includes automated exposure control, adjustment of the mA and/or kV according to patient size and/or use of iterative reconstruction technique. COMPARISON:  06/26/2024 FINDINGS: CT HEAD FINDINGS Brain: There is no evidence for acute hemorrhage, hydrocephalus, mass lesion, or abnormal extra-axial fluid collection. No definite CT evidence for acute infarction. Vascular: No hyperdense vessel or unexpected calcification. Skull: No evidence for fracture. No worrisome lytic or sclerotic lesion. Sinuses/Orbits: Visualized portions of the globes and intraorbital fat are unremarkable. The visualized paranasal sinuses and mastoid air cells are clear. Other: None. CT CERVICAL SPINE FINDINGS Alignment: Reversal of normal cervical lordosis evident. Skull base and vertebrae: No acute fracture. No primary bone lesion or focal pathologic process. Soft tissues and spinal canal: No prevertebral fluid or swelling. No visible canal hematoma. Disc levels: Marked loss of disc height noted C5-6 with endplate degeneration. Facets are well aligned bilaterally. Upper chest: Unremarkable. Other: None. IMPRESSION: 1. No acute intracranial abnormality. 2. Reversal of normal cervical lordosis with degenerative changes in the cervical spine. No evidence for cervical spine fracture. Electronically Signed   By: Camellia Candle M.D.    On: 06/27/2024 13:34   CT Cervical Spine Wo Contrast Result Date: 06/27/2024 CLINICAL DATA:  Patient was found down. EXAM: CT HEAD WITHOUT CONTRAST CT CERVICAL SPINE WITHOUT CONTRAST TECHNIQUE: Multidetector CT imaging of the head and cervical spine was performed following the standard protocol without intravenous contrast. Multiplanar CT image reconstructions of the cervical spine were also generated. RADIATION DOSE REDUCTION: This exam was performed according to the departmental dose-optimization program which includes automated exposure control, adjustment of the mA and/or kV according to patient size and/or use of iterative reconstruction technique. COMPARISON:  06/26/2024 FINDINGS: CT HEAD FINDINGS Brain: There is no evidence for acute hemorrhage, hydrocephalus, mass lesion, or abnormal extra-axial fluid collection. No definite CT evidence for acute infarction. Vascular: No hyperdense vessel or unexpected calcification. Skull: No evidence for fracture. No worrisome lytic or sclerotic lesion. Sinuses/Orbits: Visualized portions of the globes and intraorbital fat are unremarkable. The visualized paranasal sinuses and mastoid air cells are clear. Other: None. CT CERVICAL SPINE FINDINGS Alignment: Reversal of normal cervical lordosis evident. Skull base and vertebrae: No  acute fracture. No primary bone lesion or focal pathologic process. Soft tissues and spinal canal: No prevertebral fluid or swelling. No visible canal hematoma. Disc levels: Marked loss of disc height noted C5-6 with endplate degeneration. Facets are well aligned bilaterally. Upper chest: Unremarkable. Other: None. IMPRESSION: 1. No acute intracranial abnormality. 2. Reversal of normal cervical lordosis with degenerative changes in the cervical spine. No evidence for cervical spine fracture. Electronically Signed   By: Camellia Candle M.D.   On: 06/27/2024 13:34   DG Chest 2 View Result Date: 06/27/2024 CLINICAL DATA:  Hypoxia. EXAM: CHEST - 2  VIEW COMPARISON:  12/13/2022 FINDINGS: Low volume film with asymmetric elevation right hemidiaphragm. Retrocardiac atelectasis or infiltrate noted. The cardiopericardial silhouette is within normal limits for size. No acute bony abnormality. Telemetry leads overlie the chest. IMPRESSION: Low volume film with retrocardiac atelectasis or infiltrate. Electronically Signed   By: Camellia Candle M.D.   On: 06/27/2024 12:38     Procedures   Medications Ordered in the ED - No data to display                                  Medical Decision Making  BP 136/86 (BP Location: Left Arm)   Pulse 71   Temp 97.7 F (36.5 C) (Oral)   Resp 16   SpO2 99%   9:25 AM  68 year old male presenting history of bipolar disorder, PTSD, anxiety, polysubstance use, hypertension, brought here from behavioral health accompanied by [employer] with complaint of back pain.  Per GP note, patient was found get into a vehicle that did not belong to him.  Patient states he does not know why he got into the vehicle but denies trying to steal it.  He is currently complaining of back pain.  Patient unsure how long he has had his back pain.  Pain is primarily to the right lower back.  Nothing seems to make it better or worse.  Denies any bowel bladder incontinence or saddle anesthesia.  No history of IV drug use.  On exam, patient is resting appears to be in no acute discomfort.  He is a poor historian.  He does not have any significant midline spine tenderness.  He does have somewhat of a vesicular rash to his right lumbar paraspinal region that has since healed over.  Perhaps this could be related to shingles.  He denies have history of shingles in the past.  He does have some tenderness there.  Plan to prescribe valacyclovir as treatment for shingles.  I suspect his altered mental status is likely substance use disorder as he was seen several days prior and had positive UDS which includes cocaine. I discussed care with Dr. Dean who  have evaluated pt and agrees rash is likely shingle.    Pt otherwise stable for discharge.  Will prescribe acyclovir , return precaution given.  Advanced imaging including MRI of the lumbar spine considered but not performed as low suspicion for epidural abscess or discitis.  Low suspicion for cauda equina.  Patient able to move all 4 extremities without difficulty.     Final diagnoses:  Herpes zoster without complication    ED Discharge Orders          Ordered    acyclovir  (ZOVIRAX ) 400 MG tablet  4 times daily        06/29/24 0851  Nivia Colon, PA-C 06/29/24 9147    Dean Clarity, MD 06/29/24 [phone]

## 2024-06-29 NOTE — ED Notes (Signed)
 Pt unable to tell me which bus he would take home. Safe transport not available on the weekend. Brother lives 4 hour away. Cab voucher approved by TOC, transportation arranged

## 2024-06-29 NOTE — Discharge Instructions (Signed)
 You were seen today for altered mental status according to EMS who picked you up from the gas station today.  Your lab work today was reassuring that I have low suspicion for any emergent cause of her symptoms today.  Recommend you continue to follow-up with a PCP due to your calcium  being slightly low today.  Recommend you continue to eat and drink regular meals and fluid intake.  Ensure that you are having at least 8 glasses of water  a day.  Return to the emergency department if you been having new or worsening symptoms which would include spasms, seizures, chest pain, shortness of breath, painful urination, fever.

## 2024-07-01 ENCOUNTER — Ambulatory Visit (HOSPITAL_COMMUNITY): Admitting: Psychiatry

## 2024-07-01 ENCOUNTER — Emergency Department (HOSPITAL_COMMUNITY)
Admission: EM | Admit: 2024-07-01 | Discharge: 2024-07-01 | Disposition: A | Discharge status: Home / Self Care | Attending: Emergency Medicine | Admitting: Emergency Medicine

## 2024-07-01 ENCOUNTER — Emergency Department (HOSPITAL_COMMUNITY)

## 2024-07-01 ENCOUNTER — Other Ambulatory Visit: Payer: Self-pay

## 2024-07-01 ENCOUNTER — Other Ambulatory Visit (HOSPITAL_COMMUNITY): Payer: Self-pay

## 2024-07-01 DIAGNOSIS — W010XXA Fall on same level from slipping, tripping and stumbling without subsequent striking against object, initial encounter: Secondary | ICD-10-CM | POA: Diagnosis not present

## 2024-07-01 DIAGNOSIS — I10 Essential (primary) hypertension: Secondary | ICD-10-CM | POA: Insufficient documentation

## 2024-07-01 DIAGNOSIS — M25511 Pain in right shoulder: Secondary | ICD-10-CM | POA: Diagnosis present

## 2024-07-01 DIAGNOSIS — S46811A Strain of other muscles, fascia and tendons at shoulder and upper arm level, right arm, initial encounter: Secondary | ICD-10-CM | POA: Insufficient documentation

## 2024-07-01 DIAGNOSIS — B86 Scabies: Secondary | ICD-10-CM | POA: Diagnosis not present

## 2024-07-01 DIAGNOSIS — Z79899 Other long term (current) drug therapy: Secondary | ICD-10-CM | POA: Insufficient documentation

## 2024-07-01 LAB — CBC WITH DIFFERENTIAL/PLATELET
Abs Immature Granulocytes: 0.07 K/uL (ref 0.00–0.07)
Basophils Absolute: 0.1 K/uL (ref 0.0–0.1)
Basophils Relative: 0 %
Eosinophils Absolute: 0.1 K/uL (ref 0.0–0.5)
Eosinophils Relative: 1 %
HCT: 47.4 % (ref 39.0–52.0)
Hemoglobin: 15.3 g/dL (ref 13.0–17.0)
Immature Granulocytes: 1 %
Lymphocytes Relative: 17 %
Lymphs Abs: 2.1 K/uL (ref 0.7–4.0)
MCH: 31.7 pg (ref 26.0–34.0)
MCHC: 32.3 g/dL (ref 30.0–36.0)
MCV: 98.3 fL (ref 80.0–100.0)
Monocytes Absolute: 1 K/uL (ref 0.1–1.0)
Monocytes Relative: 9 %
Neutro Abs: 8.7 K/uL — ABNORMAL HIGH (ref 1.7–7.7)
Neutrophils Relative %: 72 %
Platelets: 373 K/uL (ref 150–400)
RBC: 4.82 MIL/uL (ref 4.22–5.81)
RDW: 13.8 % (ref 11.5–15.5)
WBC: 12.1 K/uL — ABNORMAL HIGH (ref 4.0–10.5)
nRBC: 0 % (ref 0.0–0.2)

## 2024-07-01 LAB — BASIC METABOLIC PANEL WITH GFR
Anion gap: 15 (ref 5–15)
BUN: 21 mg/dL (ref 8–23)
CO2: 18 mmol/L — ABNORMAL LOW (ref 22–32)
Calcium: 9.2 mg/dL (ref 8.9–10.3)
Chloride: 105 mmol/L (ref 98–111)
Creatinine, Ser: 0.95 mg/dL (ref 0.61–1.24)
GFR, Estimated: >60 mL/min (ref >60)
Glucose, Bld: 102 mg/dL — ABNORMAL HIGH (ref 70–99)
Potassium: 3.8 mmol/L (ref 3.5–5.1)
Sodium: 138 mmol/L (ref 135–145)

## 2024-07-01 LAB — ETHANOL: Alcohol, Ethyl (B): <15 mg/dL (ref <15)

## 2024-07-01 MED ORDER — KETOROLAC TROMETHAMINE 60 MG/2ML IM SOLN
30.0000 mg | Freq: Once | INTRAMUSCULAR | Status: AC
  Administered 2024-07-01: 30 mg via INTRAMUSCULAR
  Filled 2024-07-01: qty 2

## 2024-07-01 MED ORDER — PERMETHRIN 5 % EX CREA
TOPICAL_CREAM | CUTANEOUS | 0 refills | Status: DC
  Filled 2024-07-01: qty 60, 7d supply, fill #0

## 2024-07-01 MED ORDER — LIDOCAINE 5 % EX PTCH
1.0000 | MEDICATED_PATCH | CUTANEOUS | Status: DC
  Administered 2024-07-01: 1 via TRANSDERMAL
  Filled 2024-07-01: qty 1

## 2024-07-01 NOTE — Discharge Planning (Signed)
 RNCM consulted in regards to medication assistance.  Pt has Medicare insurance coverage and is not eligible for Medication Assistance Through [facility]) program. RNCM suggests sending Rx to [facility] at  [address]  to fill and bring to pt at bedside prior to discharge. No further CM needs communicated at this time.

## 2024-07-01 NOTE — Discharge Instructions (Addendum)
 Please apply Permethrin  cream to your low back rash as this has the appearance of a scabies infection. You XR imaging was reassuring

## 2024-07-01 NOTE — ED Provider Notes (Addendum)
 [employer] EMERGENCY DEPARTMENT AT [facility] Provider Note   CSN: [id number] Arrival date & time: 07/01/24  9164     Patient presents with: Back Pain   Stephen Delacruz is a 68 y.o. male.    Back Pain    68 year old male with medical history significant for polysubstance abuse, bipolar disorder, HLD, HTN presenting to the emergency department with a chief complaint of right upper trapezius pain.  The patient states that the pain has been present for a couple of weeks.  He denies any specific falls or trauma.  This is his fifth presentation to the emergency department in the week for a myriad of different complaints.  On 7/3 he tripped and fell to the ground while intoxicated, had negative CT and x-ray imaging and was discharged once found to be clinically sober, return the following day with mild hypotension after reported drug use, found to have a mild AKI and fluid resuscitated, had negative CT imaging of the head and chest x-ray imaging, discharged after 2 L of IV fluids with improvement in his creatinine, return to the emergency department the following day with alcohol intoxication.  UDS in the past week was positive for THC, benzodiazepines, cocaine.  Had taken LSD on 7/3 reportedly. Seen again on 7/6 and prescribed valtrex for possible developing shingles rash. Today primarily complains of pain in the right upper trapezius. Has had recent bouts of intoxication and drug use but cannot recall any specific trauma to the area. No midline back pain or red flag symptoms.  Prior to Admission medications   Medication Sig Start Date End Date Taking? Authorizing Provider  permethrin  (ELIMITE ) 5 % cream Apply to affected area once 07/01/24  Yes Jerrol Agent, MD  acyclovir  (ZOVIRAX ) 400 MG tablet Take 1 tablet (400 mg total) by mouth 4 (four) times daily. 06/29/24   Nivia Colon, PA-C  amLODipine  (NORVASC ) 5 MG tablet Take 1 tablet (5 mg total) by mouth daily. 06/03/24   Newlin, Enobong,  MD  atorvastatin  (LIPITOR ) 80 MG tablet Take 1 tablet (80 mg total) by mouth daily. 06/03/24   Newlin, Enobong, MD  carvedilol  (COREG ) 3.125 MG tablet Take 1 tablet (3.125 mg total) by mouth 2 (two) times daily with a meal. 06/03/24   Delbert Clam, MD  Cholecalciferol  (VITAMIN D3) 10 MCG (400 UNIT) tablet TAKE 1 TABLET BY MOUTH DAILY. 07/17/23   Newlin, Enobong, MD  gabapentin  (NEURONTIN ) 300 MG capsule Take 1 capsule (300 mg total) by mouth at bedtime. 05/21/24   Marry Clamp, MD  ibuprofen  (ADVIL ) 600 MG tablet Take 1 tablet (600 mg total) by mouth every 8 (eight) hours as needed. 06/03/24   Newlin, Enobong, MD  lidocaine  (LIDODERM ) 5 % Place 1 patch onto the skin daily. Remove & Discard patch within 12 hours or as directed by MD 06/03/24   Delbert Clam, MD  OLANZapine  (ZYPREXA ) 5 MG tablet Take 1 tablet (5 mg total) by mouth at bedtime. 03/31/24 03/31/25  Marry Clamp, MD  OXcarbazepine  (TRILEPTAL ) 150 MG tablet Take 1 tablet (150 mg total) by mouth 2 (two) times daily. 03/31/24   Marry Clamp, MD  oxyCODONE -acetaminophen  (ENDOCET) 7.5-325 MG tablet Take 1 tablet by mouth every 4 (four) hours as needed for severe pain (pain score 7-10). 02/12/24   Zelaya, Oscar A, PA-C  sertraline  (ZOLOFT ) 50 MG tablet Take 1 tablet (50 mg total) by mouth daily. 03/31/24   Marry Clamp, MD  tiZANidine  (ZANAFLEX ) 4 MG tablet TAKE 1 TABLET BY MOUTH EVERY 8  HOURS AS NEEDED 05/23/24   Newlin, Enobong, MD  traZODone  (DESYREL ) 50 MG tablet Take 1 tablet (50 mg total) by mouth at bedtime as needed for sleep. 04/28/24   Marry Clamp, MD    Allergies: Prolixin  [fluphenazine ]    Review of Systems  Musculoskeletal:  Positive for back pain.  All other systems reviewed and are negative.   Updated Vital Signs BP 136/89 (BP Location: Left Arm)   Pulse 82   Temp 98.2 F (36.8 C) (Oral)   Resp 20   Wt 87.1 kg   SpO2 100%   BMI 30.99 kg/m   Physical Exam Vitals and nursing note reviewed.  Constitutional:       General: He is not in acute distress.    Comments: GCS 14  HENT:     Head: Normocephalic and atraumatic.  Eyes:     Conjunctiva/sclera: Conjunctivae normal.     Pupils: Pupils are equal, round, and reactive to light.  Cardiovascular:     Rate and Rhythm: Normal rate and regular rhythm.  Pulmonary:     Effort: Pulmonary effort is normal. No respiratory distress.  Abdominal:     General: There is no distension.     Tenderness: There is no guarding.  Musculoskeletal:        General: No deformity or signs of injury.     Cervical back: Neck supple.     Comments: TTP of the right trapezius muscle about the scapula  Skin:    Findings: No lesion or rash.     Comments: Bilateral appearing scabies like rash in the bilateral low back with excoriations present  Neurological:     General: No focal deficit present.     Mental Status: He is alert. Mental status is at baseline.  Psychiatric:     Comments: Denies SI or HI. Endorses recent hallucinations with recent drug use, denies command auditory hallucinations     (all labs ordered are listed, but only abnormal results are displayed) Labs Reviewed  CBC WITH DIFFERENTIAL/PLATELET - Abnormal; Notable for the following components:      Result Value   WBC 12.1 (*)    Neutro Abs 8.7 (*)    All other components within normal limits  BASIC METABOLIC PANEL WITH GFR - Abnormal; Notable for the following components:   CO2 18 (*)    Glucose, Bld 102 (*)    All other components within normal limits  ETHANOL    EKG: None  Radiology: DG Shoulder Right Result Date: 07/01/2024 CLINICAL DATA:  Fall.  Right shoulder pain. EXAM: RIGHT SHOULDER - 2+ VIEW COMPARISON:  None Available. FINDINGS: No acute fracture or dislocation. No aggressive osseous lesion. Glenohumeral and acromioclavicular joints are normal in alignment and exhibit mild degenerative changes. No soft tissue swelling. No radiopaque foreign bodies. Incidental linear areas of  scarring/atelectasis noted at the right lung base. IMPRESSION: *No acute osseous abnormality of the right shoulder joint. Electronically Signed   By: Ree Molt M.D.   On: 07/01/2024 11:23     Procedures   Medications Ordered in the ED  lidocaine  (LIDODERM ) 5 % 1 patch (1 patch Transdermal Patch Applied 07/01/24 1014)  ketorolac  (TORADOL ) injection 30 mg (30 mg Intramuscular Given 07/01/24 1014)                                    Medical Decision Making Amount and/or Complexity of Data Reviewed Labs: ordered.  Radiology: ordered.  Risk Prescription drug management.    68 year old male with medical history significant for polysubstance abuse, bipolar disorder, HLD, HTN presenting to the emergency department with a chief complaint of right upper trapezius pain.  The patient states that the pain has been present for a couple of weeks.  He denies any specific falls or trauma.  This is his fifth presentation to the emergency department in the week for a myriad of different complaints.  On 7/3 he tripped and fell to the ground while intoxicated, had negative CT and x-ray imaging and was discharged once found to be clinically sober, return the following day with mild hypotension after reported drug use, found to have a mild AKI and fluid resuscitated, had negative CT imaging of the head and chest x-ray imaging, discharged after 2 L of IV fluids with improvement in his creatinine, return to the emergency department the following day with alcohol intoxication.  UDS in the past week was positive for THC, benzodiazepines, cocaine.  Had taken LSD on 7/3 reportedly. Seen again on 7/6 and prescribed valtrex for possible developing shingles rash. Today primarily complains of pain in the right upper trapezius. Has had recent bouts of intoxication and drug use but cannot recall any specific trauma to the area. No midline back pain or red flag symptoms.  On arrival, the patient was vitally stable.  On exam  the patient had some tenderness about the trapezius on the right, has had falls in the setting of drug use and alcohol use over the last week, previously worked up with CT imaging of the head and chest x-ray imaging.  Was recently diagnosed with shingles however his rash in the lower back appears to be bilateral and has a linear distribution of excoriations consistent with potential scabies infection.  Will prescribe permethrin  cream to treat this.  Regarding the patient's upper trapezius pain, considered remote possibility of scapular fracture, shoulder injury, will obtain x-ray imaging to further evaluate.  X-ray imaging negative for acute traumatic injury.  Patient administered Toradol  and a lidocaine  patch and feeling symptomatically improved.  Psychologically not endorsing SI or HI, had endorsed AVH in the setting of drug use, not actively hallucinating and does not appear psychotic at this time.  At this time the patient is stable for discharge, TOC consulted to engage the patient to ensure he can afford his medications.  Patient stable at time of discharge, feeling symptomatically improved, all questions answered.     Final diagnoses:  Scabies  Strain of right trapezius muscle, initial encounter    ED Discharge Orders          Ordered    permethrin  (ELIMITE ) 5 % cream        07/01/24 1015               Jerrol Agent, MD 07/01/24 1142    Jerrol Agent, MD 07/01/24 1143
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
## 2024-07-30 ENCOUNTER — Emergency Department (HOSPITAL_COMMUNITY)
Admission: EM | Admit: 2024-07-30 | Discharge: 2024-07-31 | Disposition: A | Discharge status: Other Acute Inpatient Hospital | Attending: Student | Admitting: Student

## 2024-07-30 ENCOUNTER — Other Ambulatory Visit: Payer: Self-pay

## 2024-07-30 ENCOUNTER — Emergency Department (HOSPITAL_COMMUNITY)

## 2024-07-30 ENCOUNTER — Encounter (HOSPITAL_COMMUNITY): Payer: Self-pay | Admitting: Emergency Medicine

## 2024-07-30 DIAGNOSIS — F250 Schizoaffective disorder, bipolar type: Secondary | ICD-10-CM

## 2024-07-30 DIAGNOSIS — R0781 Pleurodynia: Secondary | ICD-10-CM | POA: Diagnosis not present

## 2024-07-30 DIAGNOSIS — R456 Violent behavior: Secondary | ICD-10-CM | POA: Diagnosis present

## 2024-07-30 DIAGNOSIS — F29 Unspecified psychosis not due to a substance or known physiological condition: Secondary | ICD-10-CM | POA: Diagnosis not present

## 2024-07-30 DIAGNOSIS — F1590 Other stimulant use, unspecified, uncomplicated: Secondary | ICD-10-CM | POA: Insufficient documentation

## 2024-07-30 DIAGNOSIS — R45851 Suicidal ideations: Secondary | ICD-10-CM | POA: Diagnosis not present

## 2024-07-30 DIAGNOSIS — F259 Schizoaffective disorder, unspecified: Secondary | ICD-10-CM | POA: Diagnosis not present

## 2024-07-30 DIAGNOSIS — Z79899 Other long term (current) drug therapy: Secondary | ICD-10-CM | POA: Insufficient documentation

## 2024-07-30 DIAGNOSIS — I10 Essential (primary) hypertension: Secondary | ICD-10-CM | POA: Insufficient documentation

## 2024-07-30 DIAGNOSIS — Z87891 Personal history of nicotine dependence: Secondary | ICD-10-CM | POA: Diagnosis not present

## 2024-07-30 LAB — CBC WITH DIFFERENTIAL/PLATELET
Abs Immature Granulocytes: 0.03 K/uL (ref 0.00–0.07)
Basophils Absolute: 0.1 K/uL (ref 0.0–0.1)
Basophils Relative: 1 %
Eosinophils Absolute: 0.3 K/uL (ref 0.0–0.5)
Eosinophils Relative: 3 %
HCT: 50.5 % (ref 39.0–52.0)
Hemoglobin: 16.2 g/dL (ref 13.0–17.0)
Immature Granulocytes: 0 %
Lymphocytes Relative: 26 %
Lymphs Abs: 3.2 K/uL (ref 0.7–4.0)
MCH: 30.5 pg (ref 26.0–34.0)
MCHC: 32.1 g/dL (ref 30.0–36.0)
MCV: 95.1 fL (ref 80.0–100.0)
Monocytes Absolute: 1 K/uL (ref 0.1–1.0)
Monocytes Relative: 8 %
Neutro Abs: 7.6 K/uL (ref 1.7–7.7)
Neutrophils Relative %: 62 %
Platelets: 366 K/uL (ref 150–400)
RBC: 5.31 MIL/uL (ref 4.22–5.81)
RDW: 13.3 % (ref 11.5–15.5)
WBC: 12.2 K/uL — ABNORMAL HIGH (ref 4.0–10.5)
nRBC: 0 % (ref 0.0–0.2)

## 2024-07-30 LAB — RAPID URINE DRUG SCREEN, HOSP PERFORMED
Amphetamines: POSITIVE — AB
Barbiturates: NOT DETECTED
Benzodiazepines: NOT DETECTED
Cocaine: POSITIVE — AB
Opiates: NOT DETECTED
Tetrahydrocannabinol: POSITIVE — AB

## 2024-07-30 LAB — COMPREHENSIVE METABOLIC PANEL WITH GFR
ALT: 23 U/L (ref 0–44)
AST: 18 U/L (ref 15–41)
Albumin: 4.4 g/dL (ref 3.5–5.0)
Alkaline Phosphatase: 134 U/L — ABNORMAL HIGH (ref 38–126)
Anion gap: 15 (ref 5–15)
BUN: 19 mg/dL (ref 8–23)
CO2: 18 mmol/L — ABNORMAL LOW (ref 22–32)
Calcium: 9.1 mg/dL (ref 8.9–10.3)
Chloride: 101 mmol/L (ref 98–111)
Creatinine, Ser: 1.16 mg/dL (ref 0.61–1.24)
GFR, Estimated: >60 mL/min (ref >60)
Glucose, Bld: 101 mg/dL — ABNORMAL HIGH (ref 70–99)
Potassium: 3.5 mmol/L (ref 3.5–5.1)
Sodium: 134 mmol/L — ABNORMAL LOW (ref 135–145)
Total Bilirubin: 1 mg/dL (ref 0.0–1.2)
Total Protein: 7.8 g/dL (ref 6.5–8.1)

## 2024-07-30 LAB — ETHANOL: Alcohol, Ethyl (B): <15 mg/dL (ref <15)

## 2024-07-30 MED ORDER — OXCARBAZEPINE 150 MG PO TABS
150.0000 mg | ORAL_TABLET | Freq: Two times a day (BID) | ORAL | Status: DC
  Administered 2024-07-30 – 2024-07-31 (×3): 150 mg via ORAL
  Filled 2024-07-30 (×3): qty 1

## 2024-07-30 MED ORDER — OLANZAPINE 5 MG PO TABS
5.0000 mg | ORAL_TABLET | Freq: Every day | ORAL | Status: DC
  Administered 2024-07-30: 5 mg via ORAL
  Filled 2024-07-30: qty 1

## 2024-07-30 MED ORDER — STERILE WATER FOR INJECTION IJ SOLN
INTRAMUSCULAR | Status: AC
  Administered 2024-07-30: 10 mL
  Filled 2024-07-30: qty 10

## 2024-07-30 MED ORDER — ZIPRASIDONE MESYLATE 20 MG IM SOLR
20.0000 mg | Freq: Once | INTRAMUSCULAR | Status: AC
  Administered 2024-07-30: 20 mg via INTRAMUSCULAR
  Filled 2024-07-30: qty 20

## 2024-07-30 MED ORDER — TRAZODONE HCL 50 MG PO TABS
50.0000 mg | ORAL_TABLET | Freq: Every day | ORAL | Status: DC
  Administered 2024-07-30: 50 mg via ORAL
  Filled 2024-07-30: qty 1

## 2024-07-30 NOTE — ED Notes (Addendum)
 Report given to Dekina, RN  1 bag of belongings to locker 29

## 2024-07-30 NOTE — Progress Notes (Signed)
 Pt has been accepted to [facility] on 07/30/2024 Bed assignment: EMERSON C UNIT   Pt meets inpatient criteria per: [name] Patient NP  Attending Physician will be: Dr. Ilsa MD  Report can be called to: [phone]   Pt can arrive after Tomorrow after the time the IM was given as they need 24 hour clearance.    Care Team Notified: Cathaleen Jacobson NP, Jon Finder RN,   [place] Jadaya Sommerfield LCSW-A   07/30/2024 12:54 PM
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
## 2024-07-30 NOTE — ED Notes (Signed)
Pt provided with a meal and something to drink.
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
## 2024-07-30 NOTE — ED Notes (Signed)
 Patient had stated earlier Wife left him. Patient now tells tech - I am almost 41, I am getting married, she is pregnant with my first child.

## 2024-07-30 NOTE — BH Assessment (Signed)
 9454 TTS clinician attempted to complete assessment by continually asking questions. After 1st question pt asleep and would not wake back up. Juaniqa, RN notified.
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
## 2024-07-30 NOTE — Progress Notes (Signed)
 LCSW Progress Note  [id number]   Stephen Delacruz  07/30/2024  12:22 PM  Description:   Inpatient Psychiatric Referral  Patient was recommended inpatient per [name] Patient NP. There are no available beds at [facility], per [facility] McNichol RN). Patient was referred to the following out of network facilities:   Destination  Service Provider Address Phone Fax  [facility]  [address], [place] [address] [phone]  [facility]  [address] [place], [place] [address] [phone]  [facility]  [address] [place], [place] [address] [phone]  [facility]  [address] [place] [address] [phone]  [facility]  [address], [place] [address] [phone]  [facility]  [address] [place] [address] [phone]  [facility] EFAX  [address] [place], [place] [phone]  [facility]  [address] [place] [address] [phone]  [facility]  [address] [place] [address] [phone]      Situation ongoing, CSW to continue following and update chart as more information becomes available.     [name] , MSW, LCSW  07/30/2024 12:22 PM

## 2024-07-30 NOTE — ED Notes (Signed)
 Per TTS, Pt would not stay awake for consult. They will attempt again at a later time.
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
## 2024-07-30 NOTE — ED Notes (Signed)
 Spoke with Bobbette with [facility].

## 2024-07-30 NOTE — ED Notes (Signed)
    Lauran Donnice ORN, LCSW  Social Worker TOC CM/SW   Progress Notes    Signed   Date of Service: 07/30/2024 12:52 PM   Signed      Pt has been accepted to [facility] on 07/30/2024 Bed assignment: [facility] C UNIT    Pt meets inpatient criteria per: [name] Patient NP   Attending Physician will be: Dr. Ilsa MD   Report can be called to: [phone]     Pt can arrive after Tomorrow after the time the IM was given as they need 24 hour clearance.      Care Team Notified: Cathaleen Jacobson NP, Jon Finder RN,    [place] LCSW-A    07/30/2024 12:54 PM          Electronically signed by Lauran Donnice ORN, LCSW at 07/30/2024 12:54 PM     ED on 07/30/2024       Detailed Report      Note shared with patient Clinical Impressions   None Care Timeline  0327   Arrived  0343   Comprehensive metabolic panel     Ethanol    CBC with Diff   0425   DG Chest Portable 1 View  1022   Ziprasidone  Mesylate 20 mg  1024   Water  For Injection Sterile 10 mL  1059   Urine rapid drug screen (hosp performed)   1348   EKG 12-Lead  1359   OXcarbazepine  150 mg
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
## 2024-07-30 NOTE — ED Notes (Signed)
 Pt dressed out in burgundy scrubs, security called to wand pt

## 2024-07-30 NOTE — ED Notes (Signed)
 Pt with one bag of belongings placed in Pt designated assigned cabinet.

## 2024-07-30 NOTE — ED Notes (Signed)
 Pt has been accepted to [facility] on 07/30/2024 Bed assignment: EM[facility] C UNIT    Pt meets inpatient criteria per: [name] Patient NP   Attending Physician will be: Dr. Ilsa MD   Report can be called to: [phone]     Pt can arrive after Tomorrow after the time the IM was given as they need 24 hour clearance.      Care Team Notified: Cathaleen Jacobson NP, Jon Finder RN,

## 2024-07-30 NOTE — ED Notes (Signed)
 Patient continues pacing halls, states I need some ativan  after hearing that another person is getting ativan . Patient told that he just received Geodon  patient states Geodon  doesn't do shit.

## 2024-07-31 NOTE — ED Notes (Signed)
 Attempted report call x6 no response

## 2024-07-31 NOTE — ED Provider Notes (Addendum)
 Emergency Medicine Observation Re-evaluation Note  Sasuke Yaffe is a 68 y.o. male, seen on rounds today.  Pt initially presented to the ED for complaints of Psychiatric Evaluation Currently, the patient is standing in his room without agitation drinking a cup of coffee that he says is not as good as the coffee he makes at home.  Physical Exam  BP (!) 146/83 (BP Location: Right Arm)   Pulse 80   Temp 97.7 F (36.5 C) (Oral)   Resp 20   Ht 5' 6 (1.676 m)   Wt 87.1 kg   SpO2 100%   BMI 30.99 kg/m  Physical Exam General: Sitting resting without distress Cardiac: No murmur Lungs: Clear breath sounds bilateral Psych: No agitation  ED Course / MDM  EKG:EKG Interpretation Date/Time:  Wednesday July 30 2024 13:48:36 EDT Ventricular Rate:  82 PR Interval:  190 QRS Duration:  86 QT Interval:  364 QTC Calculation: 425 R Axis:   -5  Text Interpretation: Normal sinus rhythm Possible Inferior infarct , age undetermined Abnormal ECG When compared with ECG of 06/27/2024, No significant change was found Confirmed by Raford Lenis ([id number]) on 07/30/2024 11:19:38 PM  I have reviewed the labs performed to date as well as medications administered while in observation.  Recent changes in the last 24 hours include none reported by overnight nursing.  Plan  Current plan is for placement.  It appears yesterday's plan was to go to [facility] so we will reassess what the plan is today.SABRA Sakai, Lonni PARAS, MD 07/31/24 [phone]  11:33 AM Patient has reportedly been accepted to [facility] excepting is Dr. Ilsa.  Will do EMTALA so patient can be transported.  I spoke to patient and he agrees with plan for transfer.   Brendan Gruwell, Lonni PARAS, MD 07/31/24 1140

## 2024-07-31 NOTE — ED Notes (Signed)
Safe transport called 

## 2024-08-13 ENCOUNTER — Ambulatory Visit: Payer: Medicare Other | Attending: Family Medicine

## 2024-08-17 ENCOUNTER — Ambulatory Visit (HOSPITAL_COMMUNITY)
Admission: EM | Admit: 2024-08-17 | Discharge: 2024-08-18 | Disposition: A | Discharge status: Other Acute Inpatient Hospital | Attending: Psychiatry | Admitting: Psychiatry

## 2024-08-17 DIAGNOSIS — F250 Schizoaffective disorder, bipolar type: Secondary | ICD-10-CM | POA: Diagnosis not present

## 2024-08-17 LAB — CBC WITH DIFFERENTIAL/PLATELET
Abs Immature Granulocytes: 0.06 K/uL (ref 0.00–0.07)
Basophils Absolute: 0.1 K/uL (ref 0.0–0.1)
Basophils Relative: 1 %
Eosinophils Absolute: 0.1 K/uL (ref 0.0–0.5)
Eosinophils Relative: 1 %
HCT: 46.7 % (ref 39.0–52.0)
Hemoglobin: 15.6 g/dL (ref 13.0–17.0)
Immature Granulocytes: 1 %
Lymphocytes Relative: 17 %
Lymphs Abs: 1.8 K/uL (ref 0.7–4.0)
MCH: 31.5 pg (ref 26.0–34.0)
MCHC: 33.4 g/dL (ref 30.0–36.0)
MCV: 94.2 fL (ref 80.0–100.0)
Monocytes Absolute: 1 K/uL (ref 0.1–1.0)
Monocytes Relative: 9 %
Neutro Abs: 7.6 K/uL (ref 1.7–7.7)
Neutrophils Relative %: 71 %
Platelets: 402 K/uL — ABNORMAL HIGH (ref 150–400)
RBC: 4.96 MIL/uL (ref 4.22–5.81)
RDW: 13.3 % (ref 11.5–15.5)
WBC: 10.6 K/uL — ABNORMAL HIGH (ref 4.0–10.5)
nRBC: 0 % (ref 0.0–0.2)

## 2024-08-17 LAB — COMPREHENSIVE METABOLIC PANEL WITH GFR
ALT: 28 U/L (ref 0–44)
AST: 25 U/L (ref 15–41)
Albumin: 4.1 g/dL (ref 3.5–5.0)
Alkaline Phosphatase: 112 U/L (ref 38–126)
Anion gap: 11 (ref 5–15)
BUN: 9 mg/dL (ref 8–23)
CO2: 22 mmol/L (ref 22–32)
Calcium: 9.3 mg/dL (ref 8.9–10.3)
Chloride: 105 mmol/L (ref 98–111)
Creatinine, Ser: 1.1 mg/dL (ref 0.61–1.24)
GFR, Estimated: >60 mL/min (ref >60)
Glucose, Bld: 110 mg/dL — ABNORMAL HIGH (ref 70–99)
Potassium: 4.1 mmol/L (ref 3.5–5.1)
Sodium: 138 mmol/L (ref 135–145)
Total Bilirubin: 0.7 mg/dL (ref 0.0–1.2)
Total Protein: 7.1 g/dL (ref 6.5–8.1)

## 2024-08-17 LAB — HEMOGLOBIN A1C
Hgb A1c MFr Bld: 5.5 % (ref 4.8–5.6)
Mean Plasma Glucose: 111.15 mg/dL

## 2024-08-17 LAB — LIPID PANEL
Cholesterol: 262 mg/dL — ABNORMAL HIGH (ref 0–200)
HDL: 33 mg/dL — ABNORMAL LOW (ref >40)
LDL Cholesterol: 195 mg/dL — ABNORMAL HIGH (ref 0–99)
Total CHOL/HDL Ratio: 7.9 ratio
Triglycerides: 172 mg/dL — ABNORMAL HIGH (ref <150)
VLDL: 34 mg/dL (ref 0–40)

## 2024-08-17 LAB — TSH: TSH: 4.173 u[IU]/mL (ref 0.350–4.500)

## 2024-08-17 LAB — ETHANOL: Alcohol, Ethyl (B): <15 mg/dL (ref <15)

## 2024-08-17 MED ORDER — LORAZEPAM 2 MG/ML IJ SOLN
2.0000 mg | Freq: Three times a day (TID) | INTRAMUSCULAR | Status: DC | PRN
Start: 2024-08-17 — End: 2024-08-18
  Administered 2024-08-17: 2 mg via INTRAMUSCULAR
  Filled 2024-08-17: qty 1

## 2024-08-17 MED ORDER — DIPHENHYDRAMINE HCL 50 MG/ML IJ SOLN
50.0000 mg | Freq: Three times a day (TID) | INTRAMUSCULAR | Status: DC | PRN
  Filled 2024-08-17: qty 1

## 2024-08-17 MED ORDER — DIPHENHYDRAMINE HCL 50 MG PO CAPS: 50.0000 mg | ORAL_CAPSULE | Freq: Three times a day (TID) | ORAL | Status: DC | PRN

## 2024-08-17 MED ORDER — ACETAMINOPHEN 325 MG PO TABS: 650.0000 mg | ORAL_TABLET | Freq: Four times a day (QID) | ORAL | Status: DC | PRN

## 2024-08-17 MED ORDER — DIPHENHYDRAMINE HCL 50 MG/ML IJ SOLN
50.0000 mg | Freq: Three times a day (TID) | INTRAMUSCULAR | Status: DC | PRN
  Administered 2024-08-17: 50 mg via INTRAMUSCULAR

## 2024-08-17 MED ORDER — DIPHENHYDRAMINE HCL 50 MG/ML IJ SOLN
INTRAMUSCULAR | Status: AC
  Administered 2024-08-17: 50 mg via INTRAMUSCULAR
  Filled 2024-08-17: qty 1

## 2024-08-17 MED ORDER — HALOPERIDOL 5 MG PO TABS: 5.0000 mg | ORAL_TABLET | Freq: Three times a day (TID) | ORAL | Status: DC | PRN

## 2024-08-17 MED ORDER — OXCARBAZEPINE 150 MG PO TABS
150.0000 mg | ORAL_TABLET | Freq: Every day | ORAL | Status: DC
  Administered 2024-08-17 – 2024-08-18 (×2): 150 mg via ORAL
  Filled 2024-08-17 (×2): qty 1

## 2024-08-17 MED ORDER — HALOPERIDOL LACTATE 5 MG/ML IJ SOLN: 5.0000 mg | Freq: Three times a day (TID) | INTRAMUSCULAR | Status: DC | PRN

## 2024-08-17 MED ORDER — TRAZODONE HCL 50 MG PO TABS
50.0000 mg | ORAL_TABLET | Freq: Every evening | ORAL | Status: DC | PRN
  Administered 2024-08-17: 50 mg via ORAL
  Filled 2024-08-17: qty 1

## 2024-08-17 MED ORDER — MAGNESIUM HYDROXIDE 400 MG/5ML PO SUSP: 30.0000 mL | Freq: Every day | ORAL | Status: DC | PRN

## 2024-08-17 MED ORDER — HALOPERIDOL LACTATE 5 MG/ML IJ SOLN
10.0000 mg | Freq: Three times a day (TID) | INTRAMUSCULAR | Status: DC | PRN
  Administered 2024-08-17: 10 mg via INTRAMUSCULAR
  Filled 2024-08-17: qty 2

## 2024-08-17 MED ORDER — LORAZEPAM 2 MG/ML IJ SOLN
INTRAMUSCULAR | Status: AC
Start: 2024-08-17 — End: 2024-08-17
  Administered 2024-08-17: 2 mg via INTRAMUSCULAR
  Filled 2024-08-17: qty 1

## 2024-08-17 MED ORDER — AMLODIPINE BESYLATE 5 MG PO TABS
5.0000 mg | ORAL_TABLET | Freq: Once | ORAL | Status: AC
  Administered 2024-08-17: 5 mg via ORAL
  Filled 2024-08-17: qty 1

## 2024-08-17 MED ORDER — LORAZEPAM 2 MG/ML IJ SOLN: 2.0000 mg | Freq: Three times a day (TID) | INTRAMUSCULAR | Status: DC | PRN

## 2024-08-17 MED ORDER — OLANZAPINE 5 MG PO TABS
5.0000 mg | ORAL_TABLET | Freq: Every day | ORAL | Status: DC
  Administered 2024-08-17: 5 mg via ORAL
  Filled 2024-08-17: qty 1

## 2024-08-17 MED ORDER — HALOPERIDOL LACTATE 5 MG/ML IJ SOLN
INTRAMUSCULAR | Status: AC
  Administered 2024-08-17: 10 mg via INTRAMUSCULAR
  Filled 2024-08-17: qty 2

## 2024-08-17 MED ORDER — ALUM & MAG HYDROXIDE-SIMETH 200-200-20 MG/5ML PO SUSP: 30.0000 mL | ORAL | Status: DC | PRN
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
## 2024-08-17 NOTE — ED Provider Notes (Addendum)
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 Final   Lymphs Abs 06/15/2024 2.6  0.7 - 4.0 K/uL Final   Monocytes Relative 06/15/2024 10  % Final    Monocytes Absolute 06/15/2024 1.3 (H)  0.1 - 1.0 K/uL Final   Eosinophils Relative 06/15/2024 2  % Final   Eosinophils Absolute 06/15/2024 0.2  0.0 - 0.5 K/uL Final   Basophils Relative 06/15/2024 0  % Final   Basophils Absolute 06/15/2024 0.1  0.0 - 0.1 K/uL Final   Immature Granulocytes 06/15/2024 0  % Final   Abs Immature Granulocytes 06/15/2024 0.05  0.00 - 0.07 K/uL Final   Performed at [facility], [address], [place], [place] [address]   Color, Urine 06/15/2024 STRAW (A)  YELLOW Final   APPearance 06/15/2024 CLEAR  CLEAR Final   Specific Gravity, Urine 06/15/2024 1.002 (L)  1.005 - 1.030 Final   pH 06/15/2024 6.0  5.0 - 8.0 Final   Glucose, UA 06/15/2024 NEGATIVE  NEGATIVE mg/dL Final   Hgb urine dipstick 06/15/2024 NEGATIVE  NEGATIVE Final   Bilirubin Urine 06/15/2024 NEGATIVE  NEGATIVE Final   Ketones, ur 06/15/2024 NEGATIVE  NEGATIVE mg/dL Final   Protein, ur 93/77/7974 NEGATIVE  NEGATIVE mg/dL Final   Nitrite 93/77/7974 NEGATIVE  NEGATIVE Final   Leukocytes,Ua 06/15/2024 NEGATIVE  NEGATIVE Final   Performed at [facility], [address] [place], [place] [address]   Total CK 06/15/2024 386  49 - 397 U/L Final   Performed at [facility], [address], [place], [place] [address]   Sodium 06/15/2024 139  135 - 145 mmol/L Final   Potassium 06/15/2024 3.5  3.5 - 5.1 mmol/L Final   Chloride 06/15/2024 106  98 - 111 mmol/L Final   BUN 06/15/2024 25 (H)  8 - 23 mg/dL Final   Creatinine, Ser 06/15/2024 1.00  0.61 - 1.24 mg/dL Final   Glucose, Bld 93/77/7974 97  70 - 99 mg/dL Final   Glucose reference range applies only to samples taken after fasting for at least 8 hours.   Calcium , Ion 06/15/2024 1.06 (L)  1.15 - 1.40 mmol/L Final   TCO2 06/15/2024 22  22 - 32 mmol/L Final   Hemoglobin 06/15/2024 12.2 (L)  13.0 - 17.0 g/dL Final   HCT 93/77/7974 36.0 (L)  39.0 - 52.0 % Final   Opiates 06/15/2024 NONE DETECTED  NONE DETECTED Final   Cocaine 06/15/2024  POSITIVE (A)  NONE DETECTED Final   Benzodiazepines 06/15/2024 NONE DETECTED  NONE DETECTED Final  Amphetamines 06/15/2024 NONE DETECTED  NONE DETECTED Final   Tetrahydrocannabinol 06/15/2024 NONE DETECTED  NONE DETECTED Final   Barbiturates 06/15/2024 NONE DETECTED  NONE DETECTED Final   Comment: (NOTE) DRUG SCREEN FOR MEDICAL PURPOSES ONLY.  IF CONFIRMATION IS NEEDED FOR ANY PURPOSE, NOTIFY LAB WITHIN 5 DAYS.  LOWEST DETECTABLE LIMITS FOR URINE DRUG SCREEN Drug Class                     Cutoff (ng/mL) Amphetamine and metabolites    1000 Barbiturate and metabolites    200 Benzodiazepine                 200 Opiates and metabolites        300 Cocaine and metabolites        300 THC                            50 Performed at [facility], [address], [place], [place] [address]    Alcohol, Ethyl (B) 06/15/2024 <15  <15 mg/dL Final   Comment: (NOTE) For medical purposes only. Performed at [facility], [address], [place], [place] [address]     Allergies: Prolixin  [fluphenazine ]  Medications:  Facility Ordered Medications  Medication   acetaminophen  (TYLENOL ) tablet 650 mg   alum & mag hydroxide-simeth (MAALOX/MYLANTA) 200-200-20 MG/5ML suspension 30 mL   magnesium  hydroxide (MILK OF MAGNESIA) suspension 30 mL   haloperidol  lactate (HALDOL ) injection 5 mg   And   diphenhydrAMINE  (BENADRYL ) injection 50 mg   And   LORazepam  (ATIVAN ) injection 2 mg   haloperidol  lactate (HALDOL ) injection 10 mg   And   diphenhydrAMINE  (BENADRYL ) injection 50 mg   And   LORazepam  (ATIVAN ) injection 2 mg   traZODone  (DESYREL ) tablet 50 mg   haloperidol  (HALDOL ) tablet 5 mg   And   diphenhydrAMINE  (BENADRYL ) capsule 50 mg   amLODipine  (NORVASC ) tablet 5 mg   OLANZapine  (ZYPREXA ) tablet 5 mg   OXcarbazepine  (TRILEPTAL ) tablet 150 mg   diphenhydrAMINE  (BENADRYL ) 50 MG/ML injection   haloperidol  lactate (HALDOL ) 5 MG/ML injection   LORazepam  (ATIVAN ) 2 MG/ML injection    PTA Medications  Medication Sig   Cholecalciferol  (VITAMIN D3) 10 MCG (400 UNIT) tablet TAKE 1 TABLET BY MOUTH DAILY.   oxyCODONE -acetaminophen  (ENDOCET) 7.5-325 MG tablet Take 1 tablet by mouth every 4 (four) hours as needed for severe pain (pain score 7-10).   sertraline  (ZOLOFT ) 50 MG tablet Take 1 tablet (50 mg total) by mouth daily.   OXcarbazepine  (TRILEPTAL ) 150 MG tablet Take 1 tablet (150 mg total) by mouth 2 (two) times daily.   OLANZapine  (ZYPREXA ) 5 MG tablet Take 1 tablet (5 mg total) by mouth at bedtime.   traZODone  (DESYREL ) 50 MG tablet Take 1 tablet (50 mg total) by mouth at bedtime as needed for sleep.   gabapentin  (NEURONTIN ) 300 MG capsule Take 1 capsule (300 mg total) by mouth at bedtime.   tiZANidine  (ZANAFLEX ) 4 MG tablet TAKE 1 TABLET BY MOUTH EVERY 8 HOURS AS NEEDED   amLODipine  (NORVASC ) 5 MG tablet Take 1 tablet (5 mg total) by mouth daily.   lidocaine  (LIDODERM ) 5 % Place 1 patch onto the skin daily. Remove & Discard patch within 12 hours or as directed by MD   atorvastatin  (LIPITOR ) 80 MG tablet Take 1 tablet (80 mg total) by mouth daily.   carvedilol  (COREG ) 3.125 MG tablet Take 1  tablet (3.125 mg total) by mouth 2 (two) times daily with a meal.   ibuprofen  (ADVIL ) 600 MG tablet Take 1 tablet (600 mg total) by mouth every 8 (eight) hours as needed.   acyclovir  (ZOVIRAX ) 400 MG tablet Take 1 tablet (400 mg total) by mouth 4 (four) times daily.   permethrin  (ELIMITE ) 5 % cream Apply to affected area once      Medical Decision Making  Inpatient admission -Restarted home meds where appropriate -See chart for agitation orders    Recommendations  Based on my evaluation the patient does not appear to have an emergency medical condition.  Staci LOISE Kerns, NP 08/17/24  8:41 AM

## 2024-08-17 NOTE — Progress Notes (Signed)
 [facility] LCSW Progress Note   08/17/2024    10:07 AM  Stephen Delacruz   [id number]   Type of Contact and Topic:  Psychiatric Bed Placement   Pt accepted to [facility]     Patient meets inpatient criteria per Staci Kerns, NP  The attending provider will be Dr. Millie Manners   Call report to [phone]  Beulah Guan, RN @ [facility] notified.     Pt scheduled  to arrive at [facility] AFTER 0800.    Bunnie Gallop, MSW, LCSW-A  10:08 AM 08/17/2024
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
## 2024-08-17 NOTE — ED Notes (Signed)
 Patient resting quietly in bed with eyes closed, Respirations equal and unlabored, skin warm and dry, NAD. Routine safety checks conducted according to facility protocol. Will continue to monitor for safety.

## 2024-08-17 NOTE — Progress Notes (Signed)
 Patient has been denied by [facility] due to no appropriate beds available. Patient meets BH inpatient criteria per Staci Kerns, NP. Patient has been faxed out to the following facilities:   [facility]  [address] [place] [address] [phone]  [facility]  [address] [place], [place] [address] [phone]  [facility]  [address] [place] [address] [phone]  [facility]  [address] [place] [address] [phone]  [facility]  [address], [place] [address] [phone]  [facility]  [address] [place], [place] [address] [phone]  [facility]  [facility] [place] [phone]  [facility]  [address] [place] [address] [phone]  [facility]  [address] [place] [address] [phone]  [facility]  [address] [place], [place] [phone]  [facility]  [address] [place] [address] [phone]  [facility]  [address] [place] [address] [phone]  [facility]  [address] [place], [place] [address] [phone]  [facility]  [address] [place], [place] [address] [phone]  [facility]  [address] [place] [address] [phone]  [facility]  [address] [place], [place] [address] [phone]  [facility]  [address] [place]  [address] [phone]  [facility] [address]  [place] [address] [phone]  [facility]  [address] [facility] [place] [address] [phone]  [facility]  [address] [place] [address] [phone]  [facility]  [address] [place] [address] [phone]  [facility]  [address] [place] [address] [phone]  [facility]  [address], [place] [address] [phone]   Bunnie Gallop, MSW, LCSW-A  9:37 AM 08/17/2024

## 2024-08-17 NOTE — ED Notes (Signed)
 Patient peed all over one of the beds in flex, and had food and garbage everywhere, He also pooped on himself, I gave him towels and soap, along with clean scrubs, He refused to take a shower. He did go into the bathroom for along time and started yelling and making loud noises, he came out naked and wanted a nurse to come in but was told to go back into the bathroom and put scrubs on, security was called in. He continued to be naked and was given more scrubs to put on, this went on back and forth for awhile getting him to come out clothed.
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
## 2024-08-18 DIAGNOSIS — F250 Schizoaffective disorder, bipolar type: Secondary | ICD-10-CM | POA: Diagnosis not present

## 2024-08-18 NOTE — ED Provider Notes (Signed)
 FBC/OBS ASAP Discharge Summary  Date and Time: 08/18/2024 9:56 AM  Name: Stephen Delacruz  MRN:  [id number]   Discharge Diagnoses:  Final diagnoses:  Schizoaffective disorder, bipolar type [facility])   HPI: Stephen Delacruz is a 68 y.o. male w/ hx of schizoaffective bipolar type v bipolar v substance inudced, stimulant use disorder brought in my police under IVC for aggressive and dysorganized behavior and HI toward neighbor.   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. Denied SI. Does not specifically deny HI when asked. Says I wish I never said I would kill them out loud. Reports he does not want meds right now. Asking to leave but understand when told he cannot.   Stay Summary: patient had pressured speech. affect was labile but mostly pleasant. Lots of delusions that may be chronic. Slept overnight per nursing, without significant medication. Not responding to stimuli.   Total Time spent with patient: 30 minutes  Past Psychiatric History: schizoaffective bipolar type v bipolar v substance inudced, stimulant use disorder Past Medical History: HLD, HTN, numerous ED visits for various pains/acute issues Family History: family history includes Cancer in an other family member; Hypertension in an other family member; Mental illness in his father and mother; Parkinson's disease in an other family member. Family Psychiatric History: no specific disorders per patient Social History: lives in apartment alone. Not employed. Cannot provide more hx with mental state currently.  Tobacco Cessation:  N/A, patient does not currently use tobacco products  Current Medications:  Current Facility-Administered Medications  Medication Dose Route Frequency Provider Last Rate Last Admin   acetaminophen  (TYLENOL ) tablet 650 mg  650 mg Oral Q6H PRN Lewis, Tanika N, NP       alum & mag hydroxide-simeth (MAALOX/MYLANTA) 200-200-20 MG/5ML suspension 30 mL  30 mL Oral  Q4H PRN Lewis, Tanika N, NP       haloperidol  (HALDOL ) tablet 5 mg  5 mg Oral TID PRN Ezzard Staci SAILOR, NP       And   diphenhydrAMINE  (BENADRYL ) capsule 50 mg  50 mg Oral TID PRN Lewis, Tanika N, NP       haloperidol  lactate (HALDOL ) injection 5 mg  5 mg Intramuscular TID PRN Ezzard Staci SAILOR, NP       And   diphenhydrAMINE  (BENADRYL ) injection 50 mg  50 mg Intramuscular TID PRN Ezzard Staci SAILOR, NP       And   LORazepam  (ATIVAN ) injection 2 mg  2 mg Intramuscular TID PRN Ezzard Staci SAILOR, NP       haloperidol  lactate (HALDOL ) injection 10 mg  10 mg Intramuscular TID PRN Lewis, Tanika N, NP   10 mg at 08/17/24 1551   And   diphenhydrAMINE  (BENADRYL ) injection 50 mg  50 mg Intramuscular TID PRN Lewis, Tanika N, NP   50 mg at 08/17/24 1551   And   LORazepam  (ATIVAN ) injection 2 mg  2 mg Intramuscular TID PRN Lewis, Tanika N, NP   2 mg at 08/17/24 1551   magnesium  hydroxide (MILK OF MAGNESIA) suspension 30 mL  30 mL Oral Daily PRN Lewis, Tanika N, NP       OLANZapine  (ZYPREXA ) tablet 5 mg  5 mg Oral QHS Ezzard Staci SAILOR, NP   5 mg at 08/17/24 2254   OXcarbazepine  (TRILEPTAL ) tablet 150 mg  150 mg Oral Daily Ezzard Staci SAILOR, NP   150 mg at 08/18/24 0914   traZODone  (DESYREL ) tablet 50 mg  50 mg Oral QHS PRN Lewis, Tanika N, NP   50 mg at 08/17/24 2254   Current Outpatient Medications  Medication Sig Dispense Refill   acyclovir  (ZOVIRAX ) 400 MG tablet Take 1 tablet (400 mg total) by mouth 4 (four) times daily. 50 tablet 0   amLODipine  (NORVASC ) 5 MG tablet Take 1 tablet (5 mg total) by mouth daily. 90 tablet 1   atorvastatin  (LIPITOR ) 80 MG tablet Take 1 tablet (80 mg total) by mouth daily. 90 tablet 1   carvedilol  (COREG ) 3.125 MG tablet Take 1 tablet (3.125 mg total) by mouth 2 (two) times daily with a meal. 180 tablet 1   gabapentin  (NEURONTIN ) 300 MG capsule Take 1 capsule (300 mg total) by mouth at bedtime. 30 capsule 2   OLANZapine  (ZYPREXA ) 5 MG tablet Take 1 tablet (5 mg total) by mouth at  bedtime. 30 tablet 2   OXcarbazepine  (TRILEPTAL ) 150 MG tablet Take 1 tablet (150 mg total) by mouth 2 (two) times daily. 60 tablet 2   tiZANidine  (ZANAFLEX ) 4 MG tablet TAKE 1 TABLET BY MOUTH EVERY 8 HOURS AS NEEDED 30 tablet 0   traZODone  (DESYREL ) 50 MG tablet Take 1 tablet (50 mg total) by mouth at bedtime as needed for sleep. 90 tablet 1    PTA Medications:  Facility Ordered Medications  Medication   acetaminophen  (TYLENOL ) tablet 650 mg   alum & mag hydroxide-simeth (MAALOX/MYLANTA) 200-200-20 MG/5ML suspension 30 mL   magnesium  hydroxide (MILK OF MAGNESIA) suspension 30 mL   haloperidol  lactate (HALDOL ) injection 5 mg   And   diphenhydrAMINE  (BENADRYL ) injection 50 mg   And   LORazepam  (ATIVAN ) injection 2 mg   haloperidol  lactate (HALDOL ) injection 10 mg   And   diphenhydrAMINE  (BENADRYL ) injection 50 mg   And   LORazepam  (ATIVAN ) injection 2 mg   traZODone  (DESYREL ) tablet 50 mg   haloperidol  (HALDOL ) tablet 5 mg   And   diphenhydrAMINE  (BENADRYL ) capsule 50 mg   [COMPLETED] amLODipine  (NORVASC ) tablet 5 mg   OLANZapine  (ZYPREXA ) tablet 5 mg   OXcarbazepine  (TRILEPTAL ) tablet 150 mg   PTA Medications  Medication Sig   OXcarbazepine  (TRILEPTAL ) 150 MG tablet Take 1 tablet (150 mg total) by mouth 2 (two) times daily.   OLANZapine  (ZYPREXA ) 5 MG tablet Take 1 tablet (5 mg total) by mouth at bedtime.   traZODone  (DESYREL ) 50 MG tablet Take 1 tablet (50 mg total) by mouth at bedtime as needed for sleep.   gabapentin  (NEURONTIN ) 300 MG capsule Take 1 capsule (300 mg total) by mouth at bedtime.   tiZANidine  (ZANAFLEX ) 4 MG tablet TAKE 1 TABLET BY MOUTH EVERY 8 HOURS AS NEEDED   amLODipine  (NORVASC ) 5 MG tablet Take 1 tablet (5 mg total) by mouth daily.   atorvastatin  (LIPITOR ) 80 MG tablet Take 1 tablet (80 mg total) by mouth daily.   carvedilol  (COREG ) 3.125 MG tablet Take 1 tablet (3.125 mg total) by mouth 2 (two) times daily with a meal.   acyclovir  (ZOVIRAX ) 400 MG  tablet Take 1 tablet (400 mg total) by mouth 4 (four) times daily.       05/30/2023    3:16 PM 04/17/2023    9:33 AM 03/20/2023    3:04 PM  Depression screen PHQ 2/9  Decreased Interest 1 3 3   Down, Depressed, Hopeless 1 2 3   PHQ - 2 Score 2 5 6   Altered sleeping 2 3 3   Tired, decreased energy 2 1 3   Change in appetite 1  0 3  Feeling bad or failure about yourself  0 1 3  Trouble concentrating 1 1 3   Moving slowly or fidgety/restless 1 0 3  Suicidal thoughts 0 0 3  PHQ-9 Score 9 11 27     Flowsheet Row ED from 08/17/2024 in [facility] ED from 07/30/2024 in [facility] Emergency Department at [facility] ED from 07/01/2024 in [facility] Department at [facility]  C-SSRS RISK CATEGORY No Risk High Risk No Risk    Musculoskeletal  Strength & Muscle Tone: within normal limits Gait & Station: normal Patient leans: N/A  Psychiatric Specialty Exam  Presentation  General Appearance:  Casual (Pajama pants appearance slightly disheveled)  Eye Contact: Good  Speech: Clear and Coherent  Speech Volume: Increased  Handedness: Right   Mood and Affect  Mood: Irritable; Labile  Affect: Congruent   Thought Process  Thought Processes: Linear  Descriptions of Associations:Intact  Orientation:Full (Time, Place and Person)  Thought Content:Logical  Diagnosis of Schizophrenia or Schizoaffective disorder in past: Yes  Duration of Psychotic Symptoms: Greater than six months   Hallucinations:Hallucinations: Auditory Description of Auditory Hallucinations: Denied voices are command in nature  Ideas of Reference:None  Suicidal Thoughts:Suicidal Thoughts: Yes, Passive SI Passive Intent and/or Plan: Without Intent  Homicidal Thoughts:Homicidal Thoughts: Yes, Active (IVC for threatening to stab his neighbor) HI Active Intent and/or Plan: With Intent   Sensorium  Memory: Remote Fair; Immediate  Fair  Judgment: Fair  Insight: Fair   Art therapist  Concentration:No data recorded Attention Span: Good  Recall: Good  Fund of Knowledge: Good  Language: Good   Psychomotor Activity  Psychomotor Activity: Psychomotor Activity: Normal   Assets  Assets: Resilience; Desire for Improvement; Social Support   Sleep  Sleep: Sleep: Fair  No Safety Checks orders active in given range  Nutritional Assessment (For OBS and [facility] admissions only) Has the patient had a weight loss or gain of 10 pounds or more in the last 3 months?: No Has the patient had a decrease in food intake/or appetite?: No Does the patient have dental problems?: No Does the patient have eating habits or behaviors that may be indicators of an eating disorder including binging or inducing vomiting?: No Has the patient recently lost weight without trying?: 0 Has the patient been eating poorly because of a decreased appetite?: 0 Malnutrition Screening Tool Score: 0    Physical Exam  Physical Exam Vitals and nursing note reviewed.  Constitutional:      General: He is not in acute distress.    Appearance: He is well-developed.  HENT:     Head: Normocephalic and atraumatic.  Pulmonary:     Effort: Pulmonary effort is normal. No respiratory distress.  Musculoskeletal:        General: No swelling.  Neurological:     General: No focal deficit present.     Mental Status: He is alert.  Psychiatric:     Comments: No EPS.     Review of Systems  Constitutional:  Negative for fever.  Cardiovascular:  Negative for chest pain and palpitations.  Gastrointestinal:  Negative for constipation, diarrhea, nausea and vomiting.  Neurological:  Negative for dizziness, weakness and headaches.   Blood pressure (!) 150/84, pulse 97, temperature 98 F (36.7 C), temperature source Oral, resp. rate 19, SpO2 99%. There is no height or weight on file to calculate BMI.  Demographic Factors:  Male  Loss  Factors: none  Historical Factors: NA  Risk Reduction  Factors:   NA  Continued Clinical Symptoms:  Mania and psychosis.   Cognitive Features That Contribute To Risk:  Loss of executive function    Suicide Risk:  Minimal: No identifiable suicidal ideation.  Patients presenting with no risk factors but with morbid ruminations; may be classified as minimal risk based on the severity of the depressive symptoms  Plan Of Care/Follow-up recommendations:  Given homicidal threats and concerns for mania/psychosis, continuing IVC and recommending inpatient. Defer to inpatient for starting medication. Neighbors who patient was making threats to appear aware based on the presenting information from IVC affidavit. Acute medical causes of toxic metabolic ruled down. Pt refused to give a urine sample but was accepted despite this but suspect had amphetamines per historical data.  Dis[facility] under [facility]C  Justino Cornish, MD PGY-2 Psychiatry Resident 08/18/2024, 3:35 PM

## 2024-08-18 NOTE — Discharge Instructions (Addendum)
 Follow-up recommendations:  Activity:  Normal, as tolerated Diet:  Per PCP recommendation  Patient is instructed prior to discharge to:  Take all medications as prescribed by mental healthcare provider. Report any adverse effects and/or reactions from the medicines to outpatient provider promptly. To not engage in substance use while on psychiatric medicines.  In the event of worsening symptoms, patient is instructed to call the crisis hotline at 988, 911, or go to the nearest ED for appropriate evaluation and treatment of symptoms. To follow-up with primary care provider for your other medical issues, concerns and, or healthcare needs.

## 2024-08-18 NOTE — ED Notes (Signed)
 Patient was yelling and hitting the glass, cussing,calling the nurse a Bitch he would not listen to the tech or nurses. Security was called and they were able to calm the patient down, he listened to them. I gave the patient some juice and he was calm.

## 2024-08-18 NOTE — ED Notes (Signed)
 Patient woke up yelling, banging on window and cursing at staff.  Patient required security presence and was able to calm down once they spoke to him.  Patient was assisted in making a phone call however no one answered.  Patient was then flirtatious with Clinical research associate.  He is delusional and grandiose.  Stating I am [place].  He also states he owns a submarine and that he is going to buy [place] and give you all raises.  Patient asked to shower an was assisted with same.  He just came out of shower and is calm and cooperative at this time.  Patient states the only way I would feel better is if you were here with me. Patient encouraged to ask for writer to get needs met and not bang on window.  Patient stated he would comply with request.  Patient is IVC'd and will be transported to [place] via sheriff.  Report called and sheriff is pending.  Will monitor and provide safe environment.

## 2024-08-18 NOTE — ED Notes (Signed)
 Patient is being raciest talking about the MHT's hair calling it Nappy, calling her black, along with other racist names. He was told his behavior was not tolerated and not appreciated, he used more profanity and went to the phone asking again how to use it, then pretended to use the phone. He then asked for clothes to wear, he was told patient's wear scrubs.

## 2024-08-18 NOTE — ED Notes (Signed)
 Pt soiled his pants. Pt was given a bath and area was cleaned
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
## 2024-09-08 ENCOUNTER — Ambulatory Visit (HOSPITAL_BASED_OUTPATIENT_CLINIC_OR_DEPARTMENT_OTHER)

## 2024-09-08 DIAGNOSIS — F5104 Psychophysiologic insomnia: Secondary | ICD-10-CM

## 2024-09-08 DIAGNOSIS — F319 Bipolar disorder, unspecified: Secondary | ICD-10-CM | POA: Diagnosis not present

## 2024-09-08 MED ORDER — OXCARBAZEPINE 150 MG PO TABS: 150.0000 mg | ORAL_TABLET | Freq: Two times a day (BID) | ORAL | 2 refills | Status: DC

## 2024-09-08 MED ORDER — TRAZODONE HCL 50 MG PO TABS: 50.0000 mg | ORAL_TABLET | Freq: Every evening | ORAL | 1 refills | Status: DC | PRN

## 2024-09-08 MED ORDER — OLANZAPINE 5 MG PO TABS
5.0000 mg | ORAL_TABLET | Freq: Every day | ORAL | 2 refills | Status: DC
Start: 2024-09-08 — End: 2024-10-29

## 2024-09-08 MED ORDER — DIVALPROEX SODIUM 500 MG PO DR TAB
500.0000 mg | DELAYED_RELEASE_TABLET | Freq: Every day | ORAL | 1 refills | Status: DC
Start: 2024-09-08 — End: 2024-10-29

## 2024-09-08 MED ORDER — SERTRALINE HCL 50 MG PO TABS
50.0000 mg | ORAL_TABLET | Freq: Every day | ORAL | 1 refills | Status: DC
Start: 2024-09-08 — End: 2024-10-29

## 2024-09-08 NOTE — Addendum Note (Signed)
 Addended by: CARVIN CROCK on: 09/08/2024 12:44 PM   Modules accepted: Level of Service
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
## 2024-10-29 ENCOUNTER — Encounter (HOSPITAL_COMMUNITY): Payer: Self-pay

## 2024-10-29 ENCOUNTER — Ambulatory Visit (HOSPITAL_BASED_OUTPATIENT_CLINIC_OR_DEPARTMENT_OTHER)

## 2024-10-29 ENCOUNTER — Ambulatory Visit (HOSPITAL_COMMUNITY)

## 2024-10-29 VITALS — BP 137/83 | HR 81 | Ht 66.0 in | Wt 178.0 lb

## 2024-10-29 DIAGNOSIS — F319 Bipolar disorder, unspecified: Secondary | ICD-10-CM

## 2024-10-29 DIAGNOSIS — F5104 Psychophysiologic insomnia: Secondary | ICD-10-CM

## 2024-10-29 MED ORDER — OXCARBAZEPINE 150 MG PO TABS: 75.0000 mg | ORAL_TABLET | Freq: Two times a day (BID) | ORAL | 0 refills | Status: DC

## 2024-10-29 MED ORDER — TRAZODONE HCL 50 MG PO TABS: 50.0000 mg | ORAL_TABLET | Freq: Every evening | ORAL | 1 refills | Status: DC | PRN

## 2024-10-29 MED ORDER — OLANZAPINE 5 MG PO TABS: 5.0000 mg | ORAL_TABLET | Freq: Every day | ORAL | 1 refills | Status: DC

## 2024-10-29 MED ORDER — SERTRALINE HCL 50 MG PO TABS: 50.0000 mg | ORAL_TABLET | Freq: Every day | ORAL | 1 refills | Status: DC

## 2024-10-29 MED ORDER — DIVALPROEX SODIUM 250 MG PO DR TAB: 750.0000 mg | DELAYED_RELEASE_TABLET | Freq: Every day | ORAL | 1 refills | Status: DC
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
## 2024-11-30 ENCOUNTER — Other Ambulatory Visit (HOSPITAL_COMMUNITY): Payer: Self-pay

## 2024-11-30 DIAGNOSIS — F319 Bipolar disorder, unspecified: Secondary | ICD-10-CM

## 2024-12-03 ENCOUNTER — Ambulatory Visit (HOSPITAL_BASED_OUTPATIENT_CLINIC_OR_DEPARTMENT_OTHER)

## 2024-12-03 ENCOUNTER — Ambulatory Visit (HOSPITAL_COMMUNITY)

## 2024-12-03 ENCOUNTER — Ambulatory Visit: Attending: Family Medicine | Admitting: Family Medicine

## 2024-12-03 ENCOUNTER — Encounter: Payer: Self-pay | Admitting: Family Medicine

## 2024-12-03 VITALS — BP 121/76 | HR 81 | Temp 97.7°F | Ht 66.0 in | Wt 202.0 lb

## 2024-12-03 DIAGNOSIS — Z1211 Encounter for screening for malignant neoplasm of colon: Secondary | ICD-10-CM

## 2024-12-03 DIAGNOSIS — I10 Essential (primary) hypertension: Secondary | ICD-10-CM | POA: Diagnosis not present

## 2024-12-03 DIAGNOSIS — F319 Bipolar disorder, unspecified: Secondary | ICD-10-CM | POA: Diagnosis not present

## 2024-12-03 DIAGNOSIS — F5104 Psychophysiologic insomnia: Secondary | ICD-10-CM

## 2024-12-03 DIAGNOSIS — E782 Mixed hyperlipidemia: Secondary | ICD-10-CM | POA: Diagnosis not present

## 2024-12-03 DIAGNOSIS — Z79899 Other long term (current) drug therapy: Secondary | ICD-10-CM

## 2024-12-03 DIAGNOSIS — M47896 Other spondylosis, lumbar region: Secondary | ICD-10-CM

## 2024-12-03 DIAGNOSIS — M479 Spondylosis, unspecified: Secondary | ICD-10-CM | POA: Insufficient documentation

## 2024-12-03 DIAGNOSIS — F259 Schizoaffective disorder, unspecified: Secondary | ICD-10-CM | POA: Insufficient documentation

## 2024-12-03 DIAGNOSIS — M549 Dorsalgia, unspecified: Secondary | ICD-10-CM | POA: Diagnosis not present

## 2024-12-03 DIAGNOSIS — F3164 Bipolar disorder, current episode mixed, severe, with psychotic features: Secondary | ICD-10-CM | POA: Diagnosis not present

## 2024-12-03 MED ORDER — SERTRALINE HCL 50 MG PO TABS: 50.0000 mg | ORAL_TABLET | Freq: Every day | ORAL | 1 refills | Status: DC

## 2024-12-03 MED ORDER — GABAPENTIN 300 MG PO CAPS: 300.0000 mg | ORAL_CAPSULE | Freq: Every day | ORAL | 0 refills | Status: DC

## 2024-12-03 MED ORDER — TRAZODONE HCL 50 MG PO TABS: 50.0000 mg | ORAL_TABLET | Freq: Every evening | ORAL | 1 refills | Status: DC | PRN

## 2024-12-03 MED ORDER — OLANZAPINE 5 MG PO TABS: 5.0000 mg | ORAL_TABLET | Freq: Every day | ORAL | 1 refills | Status: DC

## 2024-12-03 MED ORDER — AMLODIPINE BESYLATE 5 MG PO TABS: 5.0000 mg | ORAL_TABLET | Freq: Every day | ORAL | 1 refills | Status: AC

## 2024-12-03 MED ORDER — DIVALPROEX SODIUM 250 MG PO DR TAB: 750.0000 mg | DELAYED_RELEASE_TABLET | Freq: Every day | ORAL | 1 refills | Status: DC

## 2024-12-03 MED ORDER — CARVEDILOL 3.125 MG PO TABS: 3.1250 mg | ORAL_TABLET | Freq: Two times a day (BID) | ORAL | 1 refills | Status: AC

## 2024-12-03 NOTE — Progress Notes (Signed)
 Patient arrived today for his due Depakote  level. I drew a green top using the Right AC and a 22 G straight needle

## 2024-12-03 NOTE — Patient Instructions (Signed)
 VISIT SUMMARY:  Today, you came in for a follow-up visit to check on your hypertension and hyperlipidemia. You have been taking your medications as prescribed and have no new concerns. We discussed your recent weight gain and your preference for the Cologuard test for colon cancer screening.  YOUR PLAN:  -ESSENTIAL HYPERTENSION: Essential hypertension means high blood pressure with no identifiable cause. Your blood pressure is well-controlled with your current medications, amlodipine  and carvedilol , which have been refilled.  -MIXED HYPERLIPIDEMIA: Mixed hyperlipidemia is a condition where you have high levels of different types of fats in your blood. We are waiting for your blood work results to assess your cholesterol levels. You are currently on the maximum dose of atorvastatin . Please schedule your blood work appointment. If your cholesterol remains high, we may consider a stronger medication.  -GENERAL HEALTH MAINTENANCE: You are due for colon cancer screening. You prefer the Cologuard test, which has been ordered for you.  INSTRUCTIONS:  Please schedule your blood work appointment to assess your cholesterol levels. Follow up with the Cologuard test for colon cancer screening as ordered.

## 2024-12-03 NOTE — Addendum Note (Signed)
 Addended by: CARVIN CROCK on: 12/03/2024 11:17 AM   Modules accepted: Level of Service
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
## 2024-12-06 LAB — VALPROIC ACID LEVEL: Valproic Acid Lvl: 53 ug/mL (ref 50–100)

## 2024-12-31 LAB — COLOGUARD: COLOGUARD: NEGATIVE

## 2025-01-01 ENCOUNTER — Ambulatory Visit: Payer: Self-pay | Admitting: Family Medicine

## 2025-01-08 ENCOUNTER — Other Ambulatory Visit (HOSPITAL_COMMUNITY): Payer: Self-pay

## 2025-01-28 ENCOUNTER — Ambulatory Visit (HOSPITAL_COMMUNITY)

## 2025-01-28 DIAGNOSIS — F5104 Psychophysiologic insomnia: Secondary | ICD-10-CM | POA: Diagnosis not present

## 2025-01-28 DIAGNOSIS — M549 Dorsalgia, unspecified: Secondary | ICD-10-CM | POA: Diagnosis not present

## 2025-01-28 DIAGNOSIS — F319 Bipolar disorder, unspecified: Secondary | ICD-10-CM

## 2025-01-28 MED ORDER — SERTRALINE HCL 50 MG PO TABS: 50.0000 mg | ORAL_TABLET | Freq: Every day | ORAL | 1 refills | Status: AC

## 2025-01-28 MED ORDER — TRAZODONE HCL 50 MG PO TABS: 50.0000 mg | ORAL_TABLET | Freq: Every evening | ORAL | 1 refills | Status: AC | PRN

## 2025-01-28 MED ORDER — DIVALPROEX SODIUM 250 MG PO DR TAB: 750.0000 mg | DELAYED_RELEASE_TABLET | Freq: Every day | ORAL | 1 refills | Status: AC

## 2025-01-28 MED ORDER — GABAPENTIN 300 MG PO CAPS: 300.0000 mg | ORAL_CAPSULE | Freq: Every day | ORAL | 0 refills | Status: DC

## 2025-01-28 MED ORDER — GABAPENTIN 300 MG PO CAPS: 300.0000 mg | ORAL_CAPSULE | Freq: Every day | ORAL | 1 refills | Status: AC

## 2025-01-28 MED ORDER — OLANZAPINE 5 MG PO TABS: 5.0000 mg | ORAL_TABLET | Freq: Every day | ORAL | 1 refills | Status: AC

## 2025-01-28 NOTE — Addendum Note (Signed)
 Addended by: CARVIN CROCK on: 01/28/2025 11:36 AM   Modules accepted: Level of Service

## 2025-04-29 ENCOUNTER — Ambulatory Visit (HOSPITAL_COMMUNITY)

## 2025-06-03 ENCOUNTER — Ambulatory Visit: Admitting: Family Medicine
